# Patient Record
Sex: Male | Born: 1960
Health system: Southern US, Community
[De-identification: ages and names within clinical notes are randomized; demographics above are authoritative.]

## PROBLEM LIST (undated history)

## (undated) DIAGNOSIS — E119 Type 2 diabetes mellitus without complications: Secondary | ICD-10-CM

## (undated) DIAGNOSIS — M109 Gout, unspecified: Secondary | ICD-10-CM

## (undated) DIAGNOSIS — K219 Gastro-esophageal reflux disease without esophagitis: Secondary | ICD-10-CM

## (undated) HISTORY — DX: Type 2 diabetes mellitus without complications: E11.9

## (undated) HISTORY — PX: THYROID SURGERY: SHX805

---

## 2004-05-14 ENCOUNTER — Emergency Department (HOSPITAL_COMMUNITY): Admission: EM | Admit: 2004-05-14 | Discharge: 2004-05-14 | Payer: Self-pay | Admitting: Emergency Medicine

## 2008-02-03 ENCOUNTER — Emergency Department (HOSPITAL_COMMUNITY): Admission: EM | Admit: 2008-02-03 | Discharge: 2008-02-03 | Payer: Self-pay | Admitting: Emergency Medicine

## 2011-11-15 ENCOUNTER — Encounter (HOSPITAL_COMMUNITY): Payer: Self-pay | Admitting: Emergency Medicine

## 2011-11-15 ENCOUNTER — Emergency Department (HOSPITAL_COMMUNITY)
Admission: EM | Admit: 2011-11-15 | Discharge: 2011-11-15 | Disposition: A | Discharge status: Home / Self Care | Payer: Self-pay | Attending: Emergency Medicine | Admitting: Emergency Medicine

## 2011-11-15 DIAGNOSIS — R319 Hematuria, unspecified: Secondary | ICD-10-CM | POA: Insufficient documentation

## 2011-11-15 DIAGNOSIS — N390 Urinary tract infection, site not specified: Secondary | ICD-10-CM | POA: Insufficient documentation

## 2011-11-15 DIAGNOSIS — K219 Gastro-esophageal reflux disease without esophagitis: Secondary | ICD-10-CM | POA: Insufficient documentation

## 2011-11-15 DIAGNOSIS — R300 Dysuria: Secondary | ICD-10-CM | POA: Insufficient documentation

## 2011-11-15 HISTORY — DX: Gastro-esophageal reflux disease without esophagitis: K21.9

## 2011-11-15 LAB — URINALYSIS, ROUTINE W REFLEX MICROSCOPIC
Bilirubin Urine: NEGATIVE
Ketones, ur: 15 mg/dL — AB
Nitrite: NEGATIVE
Protein, ur: 100 mg/dL — AB
pH: 6.5 (ref 5.0–8.0)

## 2011-11-15 LAB — URINE MICROSCOPIC-ADD ON

## 2011-11-15 MED ORDER — CIPROFLOXACIN HCL 500 MG PO TABS: 500.0000 mg | ORAL_TABLET | Freq: Two times a day (BID) | ORAL | Status: AC

## 2011-11-15 NOTE — ED Provider Notes (Signed)
History     CSN: [id number]  Arrival date & time 11/15/11  0719   First MD Initiated Contact with Patient 11/15/11 0732      Chief Complaint  Patient presents with  . Hematuria    (Consider location/radiation/quality/duration/timing/severity/associated sxs/prior treatment) Patient is a 51 y.o. male presenting with hematuria. The history is provided by the patient.  Hematuria This is a new problem. The current episode started today. He describes the hematuria as gross hematuria. The hematuria occurs during the initial portion of his urinary stream. He reports no clotting in his urine stream. His pain is at a severity of 0/10. He is experiencing no pain. He describes his urine color as clear. Irritative symptoms do not include frequency, nocturia or urgency. Obstructive symptoms do not include dribbling, a slower stream, straining or a weak stream. Associated symptoms include dysuria. Pertinent negatives include no abdominal pain, chills, fever, flank pain, genital pain, inability to urinate, nausea or vomiting. He is sexually active.  Pt states he did note that he had some "burning with urination" for the last two weeks. Today noted blood. Denies any pain. No penile or genital lesions. No abdominal pain. No nausea, vomiting. No fever, chills. No history of the same. Denies penile discharge.  Past Medical History  Diagnosis Date  . Acid reflux     Past Surgical History  Procedure Date  . Thyroid surgery     No family history on file.  History  Substance Use Topics  . Smoking status: Never Smoker   . Smokeless tobacco: Not on file  . Alcohol Use: No      Review of Systems  Constitutional: Negative for fever and chills.  HENT: Negative.   Respiratory: Negative.   Cardiovascular: Negative.   Gastrointestinal: Negative for nausea, vomiting and abdominal pain.  Genitourinary: Positive for dysuria and hematuria. Negative for urgency, frequency, flank pain, discharge, penile  swelling, scrotal swelling, genital sores, penile pain, testicular pain and nocturia.  Musculoskeletal: Negative.   Skin: Negative.   Neurological: Negative.   Psychiatric/Behavioral: Negative.     Allergies  Review of patient's allergies indicates no known allergies.  Home Medications   Current Outpatient Rx  Name Route Sig Dispense Refill  . IBUPROFEN 200 MG PO TABS Oral Take 200 mg by mouth every 6 (six) hours as needed. For pain      BP 161/85  Pulse 58  Temp 97.8 F (36.6 C)  Resp 16  SpO2 99%  Physical Exam  Nursing note and vitals reviewed. Constitutional: He is oriented to person, place, and time. He appears well-developed and well-nourished. No distress.  HENT:  Head: Normocephalic and atraumatic.  Eyes: Conjunctivae are normal.  Neck: Neck supple.  Cardiovascular: Normal rate, regular rhythm and normal heart sounds.   Pulmonary/Chest: Effort normal and breath sounds normal. No respiratory distress.  Abdominal: Soft. Bowel sounds are normal. He exhibits no distension. There is no tenderness.  Genitourinary: Penis normal. No penile tenderness.  Musculoskeletal: Normal range of motion.  Neurological: He is alert and oriented to person, place, and time.  Skin: Skin is warm and dry.  Psychiatric: He has a normal mood and affect.    ED Course  Procedures (including critical care time)  Pt with dysuria, hematuria onset this AM. Will check UA. Pt deneis penile discharge, lesions, scrotal pain, back pain, abdominal pain.  Results for orders placed during the hospital encounter of 11/15/11  URINALYSIS, ROUTINE W REFLEX MICROSCOPIC      Component Value Range  Color, Urine RED (*) YELLOW    APPearance TURBID (*) CLEAR    Specific Gravity, Urine 1.022  1.005 - 1.030    pH 6.5  5.0 - 8.0    Glucose, UA NEGATIVE  NEGATIVE (mg/dL)   Hgb urine dipstick LARGE (*) NEGATIVE    Bilirubin Urine NEGATIVE  NEGATIVE    Ketones, ur 15 (*) NEGATIVE (mg/dL)   Protein, ur 161  (*) NEGATIVE (mg/dL)   Urobilinogen, UA 2.0 (*) 0.0 - 1.0 (mg/dL)   Nitrite NEGATIVE  NEGATIVE    Leukocytes, UA LARGE (*) NEGATIVE   URINE MICROSCOPIC-ADD ON      Component Value Range   WBC, UA TOO NUMEROUS TO COUNT  <3 (WBC/hpf)   RBC / HPF TOO NUMEROUS TO COUNT  <3 (RBC/hpf)   Bacteria, UA FEW (*) RARE     Urine showed TNTC WBCs and few bacteria. Will treat for UTI. GC/Chlamydia cultures sent. Urine cultures sent. Pt otherwise non toxic. No signs of kidney stone, pyelonephritis, prostatitis, orchitis.     No diagnosis found.    MDM          Lottie Mussel, PA 11/15/11 [phone]

## 2011-11-15 NOTE — ED Notes (Signed)
Patient presents with dysuria x several weeks with worsening symptoms including urgency and hematuria this AM.  Patient denies penile discharge and denies pain at this time.  Patient resting quietly, no distress noted,

## 2011-11-15 NOTE — ED Notes (Signed)
Has had dysuria x 2 weeks this am had blood in urine

## 2011-11-15 NOTE — Discharge Instructions (Signed)
Your urine shows infection. This could be why you are seeing blood in it. Start cipro as prescribed until all gone. Your cultures are sent and if any different infection is identified you will be contacted by phone. Follow up with your doctor or urgent care if not improving or for recheck in 3-5 days.   Urinary Tract Infection Infections of the urinary tract can start in several places. A bladder infection (cystitis), a kidney infection (pyelonephritis), and a prostate infection (prostatitis) are different types of urinary tract infections (UTIs). They usually get better if treated with medicines (antibiotics) that kill germs. Take all the medicine until it is gone. You or your child may feel better in a few days, but TAKE ALL MEDICINE or the infection may not respond and may become more difficult to treat. HOME CARE INSTRUCTIONS   Drink enough water and fluids to keep the urine clear or pale yellow. Cranberry juice is especially recommended, in addition to large amounts of water.   Avoid caffeine, tea, and carbonated beverages. They tend to irritate the bladder.   Alcohol may irritate the prostate.   Only take over-the-counter or prescription medicines for pain, discomfort, or fever as directed by your caregiver.  To prevent further infections:  Empty the bladder often. Avoid holding urine for long periods of time.   After a bowel movement, women should cleanse from front to back. Use each tissue only once.   Empty the bladder before and after sexual intercourse.  FINDING OUT THE RESULTS OF YOUR TEST Not all test results are available during your visit. If your or your child's test results are not back during the visit, make an appointment with your caregiver to find out the results. Do not assume everything is normal if you have not heard from your caregiver or the medical facility. It is important for you to follow up on all test results. SEEK MEDICAL CARE IF:   There is back pain.    Your baby is older than 3 months with a rectal temperature of 100.5 F (38.1 C) or higher for more than 1 day.   Your or your child's problems (symptoms) are no better in 3 days. Return sooner if you or your child is getting worse.  SEEK IMMEDIATE MEDICAL CARE IF:   There is severe back pain or lower abdominal pain.   You or your child develops chills.   You have a fever.   Your baby is older than 3 months with a rectal temperature of 102 F (38.9 C) or higher.   Your baby is 55 months old or younger with a rectal temperature of 100.4 F (38 C) or higher.   There is nausea or vomiting.   There is continued burning or discomfort with urination.  MAKE SURE YOU:   Understand these instructions.   Will watch your condition.   Will get help right away if you are not doing well or get worse.  Document Released: 05/29/2005 Document Revised: 08/08/2011 Document Reviewed: 01/01/2007 [facility] Patient Information 2012 [place], [place].

## 2011-11-16 LAB — GC/CHLAMYDIA PROBE AMP, URINE: Chlamydia, Swab/Urine, PCR: NEGATIVE

## 2011-11-17 LAB — URINE CULTURE

## 2011-11-18 NOTE — ED Notes (Signed)
+   Urine Patient treated with cipro-sensitive to same-chart appended per protocol MD. 

## 2011-11-28 NOTE — ED Provider Notes (Signed)
Evaluation and management procedures were performed by the PA/NP under my supervision/collaboration.   Mahlet Jergens D Tatayana Beshears, MD 11/28/11 1023 

## 2012-12-09 ENCOUNTER — Encounter (HOSPITAL_COMMUNITY): Payer: Self-pay

## 2012-12-09 ENCOUNTER — Emergency Department (HOSPITAL_COMMUNITY)
Admission: EM | Admit: 2012-12-09 | Discharge: 2012-12-09 | Disposition: A | Discharge status: Home / Self Care | Payer: Self-pay | Attending: Emergency Medicine | Admitting: Emergency Medicine

## 2012-12-09 DIAGNOSIS — M109 Gout, unspecified: Secondary | ICD-10-CM | POA: Insufficient documentation

## 2012-12-09 DIAGNOSIS — Z8719 Personal history of other diseases of the digestive system: Secondary | ICD-10-CM | POA: Insufficient documentation

## 2012-12-09 HISTORY — DX: Gout, unspecified: M10.9

## 2012-12-09 LAB — POCT I-STAT, CHEM 8
Creatinine, Ser: 1.4 mg/dL — ABNORMAL HIGH (ref 0.50–1.35)
HCT: 43 % (ref 39.0–52.0)
Hemoglobin: 14.6 g/dL (ref 13.0–17.0)
Potassium: 3.6 mEq/L (ref 3.5–5.1)
TCO2: 23 mmol/L (ref 0–100)

## 2012-12-09 MED ORDER — HYDROCODONE-ACETAMINOPHEN 5-325 MG PO TABS: 1.0000 | ORAL_TABLET | Freq: Four times a day (QID) | ORAL | Status: DC | PRN

## 2012-12-09 MED ORDER — IBUPROFEN 200 MG PO TABS
600.0000 mg | ORAL_TABLET | Freq: Once | ORAL | Status: AC
  Administered 2012-12-09: 600 mg via ORAL
  Filled 2012-12-09: qty 3

## 2012-12-09 MED ORDER — OXYCODONE-ACETAMINOPHEN 5-325 MG PO TABS
1.0000 | ORAL_TABLET | Freq: Once | ORAL | Status: DC
  Filled 2012-12-09: qty 1

## 2012-12-09 MED ORDER — INDOMETHACIN 50 MG PO CAPS: 50.0000 mg | ORAL_CAPSULE | Freq: Three times a day (TID) | ORAL | Status: DC

## 2012-12-09 MED ORDER — PREDNISONE 20 MG PO TABS
60.0000 mg | ORAL_TABLET | Freq: Once | ORAL | Status: AC
  Administered 2012-12-09: 60 mg via ORAL
  Filled 2012-12-09: qty 3

## 2012-12-09 NOTE — ED Provider Notes (Signed)
History    This chart was scribed for non-physician practitioner Earlie Raveling PA-C working with Nelia Shi, MD by Smitty Pluck, ED scribe. This patient was seen in room WTR7/WTR7 and the patient's care was started at 7:31PM.   CSN: [id number]  Arrival date & time 12/09/12  1641      No chief complaint on file.   (Consider location/radiation/quality/duration/timing/severity/associated sxs/prior treatment) The history is provided by the patient. No language interpreter was used.   Eric Larson is a 52 y.o. male with hx of gout who presents to the Emergency Department complaining of constant, moderate right elbow pain suddenly onset today when he awoke. He reports that he has slight tingling sensation when he went to sleep last night. Movement of right arm aggravates the pain. Pt denies injury to elbow, fever, chills, nausea, vomiting, diarrhea, weakness, cough, SOB and any other pain. He denies taking medication for gout.    Past Medical History  Diagnosis Date  . Acid reflux     Past Surgical History  Procedure Laterality Date  . Thyroid surgery      No family history on file.  History  Substance Use Topics  . Smoking status: Never Smoker   . Smokeless tobacco: Not on file  . Alcohol Use: No      Review of Systems  Constitutional: Negative for fever and chills.  Respiratory: Negative for shortness of breath.   Gastrointestinal: Negative for nausea and vomiting.  Musculoskeletal: Positive for arthralgias.  Neurological: Negative for weakness.  All other systems reviewed and are negative.    Allergies  Review of patient's allergies indicates no known allergies.  Home Medications  No current outpatient prescriptions on file.  BP 142/92  Pulse 63  Temp(Src) 98.2 F (36.8 C) (Oral)  Resp 18  SpO2 99%  Physical Exam  Nursing note and vitals reviewed. Constitutional: He appears well-developed and well-nourished. No distress.  HENT:  Head: Normocephalic and  atraumatic.  Eyes: Conjunctivae and EOM are normal.  Neck: Normal range of motion. Neck supple.  Cardiovascular:  Intact distal pulses, capillary refill < 3 seconds  Musculoskeletal:  Right elbow tender, warm and swollen. Pain with flexion extension.  All other extremities with normal ROM.   Neurological:  No sensory deficit  Skin: He is not diaphoretic.  Skin intact, no tenting    ED Course  Procedures (including critical care time) DIAGNOSTIC STUDIES: Oxygen Saturation is 99% on room air, normal by my interpretation.    COORDINATION OF CARE: 7:32 PM Discussed ED treatment with pt and pt agrees to labs to check creatinine.     Labs Reviewed - No data to display No results found.   No diagnosis found.    MDM  Pt presents with a hx of gout presents w monoarticular pain, swelling and erythema.  Pt is afebrile and stable. No recent trauma. Renal function good. Pt without known peptic ulcer disease and not receiving concurrent treatment on warfarin. Treated in ER w steroids and NSAIDs. Pt dc with indomethacin (50 mg PO TID). Discussed that pt should respond to treatment with in 24 hour of begining treatment & likely resolve in 2-3 days.        I personally performed the services described in this documentation, which was scribed in my presence. The recorded information has been reviewed and is accurate.      Jaci Carrel, [place] 12/09/12 2004

## 2012-12-09 NOTE — ED Notes (Signed)
Pt c/o right elbow pain and swelling onset Monday increase in severity. Right elbow with swelling noted. Pt denies injury.

## 2012-12-09 NOTE — ED Provider Notes (Signed)
Medical screening examination/treatment/procedure(s) were performed by non-physician practitioner and as supervising physician I was immediately available for consultation/collaboration.   Nelia Shi, MD 12/09/12 2023

## 2013-01-30 ENCOUNTER — Emergency Department (HOSPITAL_COMMUNITY): Payer: Self-pay

## 2013-01-30 ENCOUNTER — Encounter (HOSPITAL_COMMUNITY): Payer: Self-pay | Admitting: Emergency Medicine

## 2013-01-30 ENCOUNTER — Emergency Department (HOSPITAL_COMMUNITY)
Admission: EM | Admit: 2013-01-30 | Discharge: 2013-01-30 | Disposition: A | Discharge status: Home / Self Care | Payer: Self-pay | Attending: Emergency Medicine | Admitting: Emergency Medicine

## 2013-01-30 DIAGNOSIS — K029 Dental caries, unspecified: Secondary | ICD-10-CM

## 2013-01-30 DIAGNOSIS — Z8719 Personal history of other diseases of the digestive system: Secondary | ICD-10-CM | POA: Insufficient documentation

## 2013-01-30 DIAGNOSIS — K047 Periapical abscess without sinus: Secondary | ICD-10-CM

## 2013-01-30 DIAGNOSIS — K0520 Aggressive periodontitis, unspecified: Secondary | ICD-10-CM | POA: Insufficient documentation

## 2013-01-30 DIAGNOSIS — M109 Gout, unspecified: Secondary | ICD-10-CM | POA: Insufficient documentation

## 2013-01-30 LAB — POCT I-STAT, CHEM 8
BUN: 16 mg/dL (ref 6–23)
Calcium, Ion: 1.24 mmol/L — ABNORMAL HIGH (ref 1.12–1.23)
Chloride: 103 mEq/L (ref 96–112)
Creatinine, Ser: 1.4 mg/dL — ABNORMAL HIGH (ref 0.50–1.35)
Glucose, Bld: 174 mg/dL — ABNORMAL HIGH (ref 70–99)
HCT: 39 % (ref 39.0–52.0)
Hemoglobin: 13.3 g/dL (ref 13.0–17.0)
Potassium: 4.5 meq/L (ref 3.5–5.1)
Sodium: 138 meq/L (ref 135–145)
TCO2: 30 mmol/L (ref 0–100)

## 2013-01-30 LAB — CBC WITH DIFFERENTIAL/PLATELET
Basophils Absolute: 0 K/uL (ref 0.0–0.1)
Basophils Relative: 0 % (ref 0–1)
Eosinophils Absolute: 0.1 K/uL (ref 0.0–0.7)
Eosinophils Relative: 1 % (ref 0–5)
HCT: 36.9 % — ABNORMAL LOW (ref 39.0–52.0)
Hemoglobin: 13.3 g/dL (ref 13.0–17.0)
Lymphocytes Relative: 11 % — ABNORMAL LOW (ref 12–46)
Lymphs Abs: 1.2 10*3/uL (ref 0.7–4.0)
MCH: 31.5 pg (ref 26.0–34.0)
MCHC: 36 g/dL (ref 30.0–36.0)
MCV: 87.4 fL (ref 78.0–100.0)
Monocytes Absolute: 1.2 K/uL — ABNORMAL HIGH (ref 0.1–1.0)
Monocytes Relative: 11 % (ref 3–12)
Neutro Abs: 8.7 10*3/uL — ABNORMAL HIGH (ref 1.7–7.7)
Neutrophils Relative %: 78 % — ABNORMAL HIGH (ref 43–77)
Platelets: 182 10*3/uL (ref 150–400)
RBC: 4.22 MIL/uL (ref 4.22–5.81)
RDW: 13 % (ref 11.5–15.5)
WBC: 11.1 10*3/uL — ABNORMAL HIGH (ref 4.0–10.5)

## 2013-01-30 MED ORDER — HYDROCODONE-ACETAMINOPHEN 5-325 MG PO TABS: ORAL_TABLET | ORAL | Status: DC

## 2013-01-30 MED ORDER — MORPHINE SULFATE 4 MG/ML IJ SOLN
4.0000 mg | Freq: Once | INTRAMUSCULAR | Status: AC
  Administered 2013-01-30: 4 mg via INTRAVENOUS
  Filled 2013-01-30: qty 1

## 2013-01-30 MED ORDER — CLINDAMYCIN PHOSPHATE 900 MG/50ML IV SOLN
900.0000 mg | Freq: Once | INTRAVENOUS | Status: AC
  Administered 2013-01-30: 900 mg via INTRAVENOUS
  Filled 2013-01-30: qty 50

## 2013-01-30 MED ORDER — AMOXICILLIN 500 MG PO CAPS: 500.0000 mg | ORAL_CAPSULE | Freq: Three times a day (TID) | ORAL | Status: DC

## 2013-01-30 MED ORDER — SODIUM CHLORIDE 0.9 % IV SOLN
Freq: Once | INTRAVENOUS | Status: AC
  Administered 2013-01-30: 12:00:00 via INTRAVENOUS

## 2013-01-30 MED ORDER — IOHEXOL 300 MG/ML  SOLN
75.0000 mL | Freq: Once | INTRAMUSCULAR | Status: AC | PRN
  Administered 2013-01-30: 75 mL via INTRAVENOUS

## 2013-01-30 NOTE — ED Notes (Signed)
Pt in the bathroom at this time.  

## 2013-01-30 NOTE — Discharge Instructions (Signed)
 Abscessed Tooth A tooth abscess is a collection of infected fluid (pus) from a bacterial infection in the inner part of the tooth (pulp). It usually occurs at the end of the tooth's root.  CAUSES   A very bad cavity (extensive tooth decay).   Trauma to the tooth, such as a broken or chipped tooth, that allows bacteria to enter into the pulp.  SYMPTOMS  Severe pain in and around the infected tooth.   Swelling and redness around the abscessed tooth or in the mouth or face.   Tenderness.   Pus drainage.   Bad breath.   Bitter taste in the mouth.   Difficulty swallowing.   Difficulty opening the mouth.   Feeling sick to your stomach (nauseous).   Vomiting.   Chills.   Swollen neck glands.  DIAGNOSIS  A medical and dental history will be taken.   An examination will be performed by tapping on the abscessed tooth.   X-rays may be taken of the tooth to identify the abscess.  TREATMENT The goal of treatment is to eliminate the infection.   You may be prescribed antibiotic medicine to stop the infection from spreading.   A root canal may be performed to save the tooth. If the tooth cannot be saved, it may be pulled (extracted) and the abscess may be drained.  HOME CARE INSTRUCTIONS  Only take over-the-counter or prescription medicines for pain, fever, or discomfort as directed by your caregiver.   Do not drive after taking pain medicine (narcotics).   Rinse your mouth (gargle) often with salt water  ( tsp salt in 8 oz of warm water ) to relieve pain or swelling.   Do not apply heat to the outside of your face.   Return to your dentist for further treatment as directed.  SEEK IMMEDIATE DENTAL CARE IF:  You have a temperature by mouth above 102 F (38.9 C), not controlled by medicine.   You have chills or a very bad headache.   You have problems breathing or swallowing.   Your have trouble opening your mouth.   You develop swelling in the neck or around the eye.    Your pain is not helped by medicine.   Your pain is getting worse instead of better.  Document Released: 08/19/2005 Document Revised: 08/08/2011 Document Reviewed: 11/27/2010 [facility] Patient Information 2012 [place], [place].    Dental Care and Dentist Visits Dental care supports good overall health. Regular dental visits can also help you avoid dental pain, bleeding, infection, and other more serious health problems in the future. It is important to keep the mouth healthy because diseases in the teeth, gums, and other oral tissues can spread to other areas of the body. Some problems, such as diabetes, heart disease, and pre-term labor have been associated with poor oral health.  See your dentist every 6 months. If you experience emergency problems such as a toothache or broken tooth, go to the dentist right away. If you see your dentist regularly, you may catch problems early. It is easier to be treated for problems in the early stages.  WHAT TO EXPECT AT A DENTIST VISIT  Your dentist will look for many common oral health problems and recommend proper treatment. At your regular dental visit, you can expect:  Gentle cleaning of the teeth and gums. This includes scraping and polishing. This helps to remove the sticky substance around the teeth and gums (plaque). Plaque forms in the mouth shortly after eating. Over time, plaque hardens on  the teeth as tartar. If tartar is not removed regularly, it can cause problems. Cleaning also helps remove stains.  Periodic X-rays. These pictures of the teeth and supporting bone will help your dentist assess the health of your teeth.  Periodic fluoride treatments. Fluoride is a natural mineral shown to help strengthen teeth. Fluoride treatmentinvolves applying a fluoride gel or varnish to the teeth. It is most commonly done in children.  Examination of the mouth, tongue, jaws, teeth, and gums to look for any oral health problems, such as:  Cavities  (dental caries). This is decay on the tooth caused by plaque, sugar, and acid in the mouth. It is best to catch a cavity when it is small.  Inflammation of the gums caused by plaque buildup (gingivitis).  Problems with the mouth or malformed or misaligned teeth.  Oral cancer or other diseases of the soft tissues or jaws. KEEP YOUR TEETH AND GUMS HEALTHY For healthy teeth and gums, follow these general guidelines as well as your dentist's specific advice:  Have your teeth professionally cleaned at the dentist every 6 months.  Brush twice daily with a fluoride toothpaste.  Floss your teeth daily.  Ask your dentist if you need fluoride supplements, treatments, or fluoride toothpaste.  Eat a healthy diet. Reduce foods and drinks with added sugar.  Avoid smoking. TREATMENT FOR ORAL HEALTH PROBLEMS If you have oral health problems, treatment varies depending on the conditions present in your teeth and gums.  Your caregiver will most likely recommend good oral hygiene at each visit.  For cavities, gingivitis, or other oral health disease, your caregiver will perform a procedure to treat the problem. This is typically done at a separate appointment. Sometimes your caregiver will refer you to another dental specialist for specific tooth problems or for surgery. SEEK IMMEDIATE DENTAL CARE IF:  You have pain, bleeding, or soreness in the gum, tooth, jaw, or mouth area.  A permanent tooth becomes loose or separated from the gum socket.  You experience a blow or injury to the mouth or jaw area. Document Released: 05/01/2011 Document Revised: 11/11/2011 Document Reviewed: 05/01/2011 [facility] Patient Information 2014 [place], [place].    Narcotic and benzodiazepine use may cause drowsiness, slowed breathing or dependence.  Please use with caution and do not drive, operate machinery or watch young children alone while taking them.  Taking combinations of these medications or drinking alcohol  will potentiate these effects.

## 2013-01-30 NOTE — ED Notes (Signed)
Pt c/o left sided dental abscess with swelling to left side of neck; pt sts some pain

## 2013-01-30 NOTE — ED Notes (Signed)
NAD noted at time d/c inst given

## 2013-01-30 NOTE — ED Provider Notes (Signed)
History     CSN: [id number]  Arrival date & time 01/30/13  1119   First MD Initiated Contact with Patient 01/30/13 1133      Chief Complaint  Patient presents with  . Abscess    (Consider location/radiation/quality/duration/timing/severity/associated sxs/prior treatment) HPI Comments: Pt reports worsening pain and swelling to left jaw, left lower chin and neck for about 2-3 days, worse today, hurts to swallow.  No SOB, no voice change.  Pt has had dental problems and has some dental pain in left lower jaw and thinks that may be the problem.  Chills last night, unknown fever.  Taking Aleve with no sig relief.  Pt was planning on calling a dentist on Monday, but getting quickly worse.  Pt had thyroid removed about 15 years ago due to size of it. Not sure if thyroid hormones were too high or too low.  Was on thyroid meds for a while, but has not had symptoms of low thyroid so didn't have to take meds any more.  Takes no meds now except prn.  Doesn't smoke, drink or do drugs.  Has no PCP.    Patient is a 52 y.o. male presenting with abscess. The history is provided by the patient.  Abscess Location:  Mouth and head/neck Head/neck abscess location:  L neck Red streaking: no   Duration:  3 days Progression:  Worsening Context: not diabetes, not immunosuppression and not skin injury   Relieved by:  Nothing Ineffective treatments:  NSAIDs Associated symptoms: fever   Associated symptoms: no nausea and no vomiting     Past Medical History  Diagnosis Date  . Acid reflux   . Gout     Past Surgical History  Procedure Laterality Date  . Thyroid surgery      History reviewed. No pertinent family history.  History  Substance Use Topics  . Smoking status: Never Smoker   . Smokeless tobacco: Not on file  . Alcohol Use: No      Review of Systems  Constitutional: Positive for fever and chills. Negative for unexpected weight change.  HENT: Positive for sore throat, trouble  swallowing, neck pain and dental problem. Negative for congestion, rhinorrhea, drooling, neck stiffness and voice change.   Respiratory: Negative for cough and shortness of breath.   Cardiovascular: Negative for chest pain.  Gastrointestinal: Negative for nausea, vomiting and abdominal pain.  Neurological: Negative for weakness.  All other systems reviewed and are negative.    Allergies  Review of patient's allergies indicates no known allergies.  Home Medications   Current Outpatient Rx  Name  Route  Sig  Dispense  Refill  . naproxen sodium (ALEVE) 220 MG tablet   Oral   Take 660 mg by mouth once.         Marland Kitchen amoxicillin (AMOXIL) 500 MG capsule   Oral   Take 1 capsule (500 mg total) by mouth 3 (three) times daily.   30 capsule   0   . HYDROcodone-acetaminophen (NORCO/VICODIN) 5-325 MG per tablet      1-2 tablets po q 6 hours prn moderate to severe pain   20 tablet   0     BP 142/85  Pulse 61  Temp(Src) 98.3 F (36.8 C) (Oral)  Resp 20  Ht 6' (1.829 m)  Wt 230 lb (104.327 kg)  BMI 31.19 kg/m2  SpO2 96%  Physical Exam  Nursing note and vitals reviewed. Constitutional: He is oriented to person, place, and time. He appears well-developed and well-nourished.  HENT:  Head: Normocephalic. No trismus in the jaw.    Mouth/Throat: Mucous membranes are not dry and not cyanotic. No oral lesions. Dental caries present. No dental abscesses or edematous. No oropharyngeal exudate.    Eyes: EOM are normal. No scleral icterus.  Neck: Neck supple. No tracheal deviation present.  Cardiovascular: Normal rate, regular rhythm and intact distal pulses.   No murmur heard. Pulmonary/Chest: Effort normal. No stridor. No respiratory distress.  Abdominal: Soft. There is no tenderness.  Lymphadenopathy:    He has no cervical adenopathy.  Neurological: He is alert and oriented to person, place, and time. Coordination normal.  Skin: Skin is warm and dry. No rash noted.  Psychiatric:  He has a normal mood and affect.    ED Course  Procedures (including critical care time)  Labs Reviewed  CBC WITH DIFFERENTIAL - Abnormal; Notable for the following:    WBC 11.1 (*)    HCT 36.9 (*)    Neutrophils Relative % 78 (*)    Neutro Abs 8.7 (*)    Lymphocytes Relative 11 (*)    Monocytes Absolute 1.2 (*)    All other components within normal limits  POCT I-STAT, CHEM 8 - Abnormal; Notable for the following:    Creatinine, Ser 1.40 (*)    Glucose, Bld 174 (*)    Calcium, Ion 1.24 (*)    All other components within normal limits   Ct Soft Tissue Neck W Contrast  01/30/2013   *RADIOLOGY REPORT*  Clinical Data: Pain and swelling under the left mandible.  CT NECK WITH CONTRAST  Technique:  Multidetector CT imaging of the neck was performed with intravenous contrast.  Contrast: 75mL OMNIPAQUE IOHEXOL 300 MG/ML  SOLN  Comparison: None.  Findings: Multiple dental caries are evident throughout the remaining teeth.  There are multiple periapical lucencies along the mandibular roots bilaterally and anteriorly in the maxilla.  There focal lucencies are noted about the roots of the remaining left mandibular molar, likely the second mandibular molar.  There is a large peripherally enhancing fluid collection which extends to the medial aspect of the left mandible where the more posterior periapical lucency extends through the cortex.  The collection measures 2.2 x 5.0 x 1.7 cm extending into the sublingual space and displacing the intrinsic tongue muscles to the right.  The left submandibular gland is displaced posteriorly and inferiorly.  There is diffuse stranding within the neck, greater on the left.  There is thickening of the left platysma muscle. Multiple submental and submandibular lymph nodes appear reactive. The parotid glands are within normal limits bilaterally. Inflammatory changes are noted within the left parapharyngeal space.  The airway is slightly displaced to the right.  Calcifications are noted within the palatine tonsils without a discrete mass.  No other focal mucosal or submucosal lesions are present.  The vocal cords are midline and symmetric.  Ectopic thyroid tissue is present at the level of the thyrohyoid ligaments in the midline anteriorly.  The patient is status post thyroidectomy in the more conventional location.  No other significant adenopathy is present.  The bone windows demonstrate mild degenerative change, most evident on the right at C3-4 and on the left at C4-5 and C5-6.  IMPRESSION:  1.  2.2 x 5.0 x 1.7 cm peripherally enhancing irregular collection within the left sublingual space appears be related to dental caries and a periapical cyst associated with the posterior left mandibular molar. This is most consistent with a sublingual abscess. 2.  Reactive type  level I level II lymph nodes bilaterally. 3.  Inflammatory changes within the submandibular space and neck, more prominent on the left. No other abscess is evident. 4.  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 5.  Mild spondylosis of the cervical spine.   Original Report Authenticated By: Marin Roberts, M.D.     1. Dental abscess   2. Dental caries      ra sat is 96% and I interpret to be normal  1:53 PM I reviewed the CT scan myself, revealed interpretation by the radiologist. I spoke to Dr. Mayford Knife who is the on-call dentist. He agrees with giving the patient a dose of IV antibiotics, recommends a prescription for amoxicillin 3 times a day, and instructs me to inform the patient to call the office first thing on Monday morning and they'll take care of him from that standpoint. This is relayed to the patient who agrees with the plan. He understands to call the dentist office first thing Monday morning. My plan is to give him a prescription for Vicodin as well as amoxicillin. He is instructed to continue  taking some ibuprofen for the swelling as well. He also understands to return if pain or swelling suddenly worsens and he has any difficulty breathing.  His pain did improve with IV morphine here.   MDM   Pt with firm mass to left lower chin, jaw and extends into ventral neck region, more to left side.  No fever.  Will obtain soft tissue CT of neck with contrast to assess for mass versus abscess.           Gavin Pound. Shacora Zynda, MD 01/30/13 1356

## 2013-02-01 ENCOUNTER — Emergency Department (HOSPITAL_COMMUNITY)
Admission: EM | Admit: 2013-02-01 | Discharge: 2013-02-01 | Discharge status: Against Medical Advice | Payer: Self-pay | Attending: Emergency Medicine | Admitting: Emergency Medicine

## 2013-02-01 ENCOUNTER — Encounter (HOSPITAL_COMMUNITY): Payer: Self-pay | Admitting: Emergency Medicine

## 2013-02-01 DIAGNOSIS — K047 Periapical abscess without sinus: Secondary | ICD-10-CM | POA: Insufficient documentation

## 2013-02-01 DIAGNOSIS — K0889 Other specified disorders of teeth and supporting structures: Secondary | ICD-10-CM

## 2013-02-01 NOTE — ED Notes (Signed)
Called x1 w/no answer  

## 2013-02-01 NOTE — ED Notes (Signed)
Pt here for dental abscess to left side of face; pt seen here recently for same and not improved

## 2013-02-01 NOTE — ED Notes (Signed)
Called x2

## 2013-02-01 NOTE — ED Notes (Signed)
Called x3 w/ no answer

## 2013-02-03 NOTE — ED Provider Notes (Signed)
Patient eloped from the emergency department before physician evaluation.  I did not see this patient.  Triage note states the patient presents with left-sided dental abscess and pain not improving.  Lyanne Co, MD 02/03/13 [phone]

## 2013-02-25 ENCOUNTER — Encounter (HOSPITAL_COMMUNITY): Payer: Self-pay | Admitting: *Deleted

## 2013-02-25 ENCOUNTER — Emergency Department (HOSPITAL_COMMUNITY)
Admission: EM | Admit: 2013-02-25 | Discharge: 2013-02-25 | Disposition: A | Discharge status: Home / Self Care | Payer: Self-pay | Attending: Emergency Medicine | Admitting: Emergency Medicine

## 2013-02-25 DIAGNOSIS — K047 Periapical abscess without sinus: Secondary | ICD-10-CM | POA: Insufficient documentation

## 2013-02-25 DIAGNOSIS — M109 Gout, unspecified: Secondary | ICD-10-CM | POA: Insufficient documentation

## 2013-02-25 DIAGNOSIS — L539 Erythematous condition, unspecified: Secondary | ICD-10-CM | POA: Insufficient documentation

## 2013-02-25 DIAGNOSIS — Z79899 Other long term (current) drug therapy: Secondary | ICD-10-CM | POA: Insufficient documentation

## 2013-02-25 DIAGNOSIS — K029 Dental caries, unspecified: Secondary | ICD-10-CM | POA: Insufficient documentation

## 2013-02-25 DIAGNOSIS — Z8719 Personal history of other diseases of the digestive system: Secondary | ICD-10-CM | POA: Insufficient documentation

## 2013-02-25 MED ORDER — HYDROCODONE-ACETAMINOPHEN 5-325 MG PO TABS: 1.0000 | ORAL_TABLET | ORAL | Status: DC | PRN

## 2013-02-25 MED ORDER — AMOXICILLIN-POT CLAVULANATE 875-125 MG PO TABS: 1.0000 | ORAL_TABLET | Freq: Two times a day (BID) | ORAL | Status: DC

## 2013-02-25 MED ORDER — HYDROCODONE-ACETAMINOPHEN 5-325 MG PO TABS
2.0000 | ORAL_TABLET | Freq: Once | ORAL | Status: AC
  Administered 2013-02-25: 2 via ORAL
  Filled 2013-02-25: qty 2

## 2013-02-25 NOTE — ED Provider Notes (Signed)
History    This chart was scribed for non-physician practitioner Dierdre Forth, PA working with Gilda Crease, * by Quintella Reichert, ED Scribe. This patient was seen in room TR07C/TR07C and the patient's care was started at 6:08 PM .   CSN: [id number]  Arrival date & time 02/25/13  1452     Chief Complaint  Patient presents with  . Abscess    The history is provided by the patient. No language interpreter was used.    HPI Comments: Eric Larson is a 52 y.o. male who presents to the Emergency Department complaining of a painful abscess to the left side of his neck that began one month ago.  Pt reports that he came to the ED for the same abscess one month ago and was prescribed a course of penicillin.  He finished his antibiotics 2 weeks ago and notes that the abscess is still present, although it is much smaller than at his last ED visit.  It was not drained at his last visit.  He has attempted to treat pain by taking six 220 mg Aleve, with significant relief.  He denies fever, chills, or any other associated symptoms.     Past Medical History  Diagnosis Date  . Acid reflux   . Gout    Past Surgical History  Procedure Laterality Date  . Thyroid surgery     History reviewed. No pertinent family history. History  Substance Use Topics  . Smoking status: Never Smoker   . Smokeless tobacco: Not on file  . Alcohol Use: No    Review of Systems  Constitutional: Negative for diaphoresis, appetite change and unexpected weight change.  HENT: Positive for dental problem. Negative for mouth sores and neck stiffness.   Eyes: Negative for visual disturbance.  Respiratory: Negative for cough, chest tightness, shortness of breath and wheezing.   Cardiovascular: Negative for chest pain.  Gastrointestinal: Negative for abdominal pain, diarrhea and constipation.  Endocrine: Negative for polydipsia, polyphagia and polyuria.  Genitourinary: Negative for dysuria, urgency,  frequency and hematuria.  Musculoskeletal: Negative for back pain.  Skin: Negative for rash.       Abscess  Allergic/Immunologic: Negative for immunocompromised state.  Neurological: Negative for syncope and light-headedness.  Hematological: Does not bruise/bleed easily.  Psychiatric/Behavioral: Negative for sleep disturbance. The patient is not nervous/anxious.     Allergies  Review of patient's allergies indicates no known allergies.  Home Medications   Current Outpatient Rx  Name  Route  Sig  Dispense  Refill  . naproxen sodium (ALEVE) 220 MG tablet   Oral   Take 1,320 mg by mouth at bedtime.          Marland Kitchen amoxicillin-clavulanate (AUGMENTIN) 875-125 MG per tablet   Oral   Take 1 tablet by mouth every 12 (twelve) hours.   14 tablet   0   . HYDROcodone-acetaminophen (NORCO/VICODIN) 5-325 MG per tablet   Oral   Take 1 tablet by mouth every 4 (four) hours as needed for pain.   11 tablet   0    BP 137/91  Pulse 70  Temp(Src) 97.7 F (36.5 C) (Oral)  Resp 18  SpO2 97%  Physical Exam  Nursing note and vitals reviewed. Constitutional: He is oriented to person, place, and time. He appears well-developed and well-nourished. No distress.  HENT:  Head: Normocephalic and atraumatic. No trismus in the jaw.  Right Ear: Tympanic membrane, external ear and ear canal normal.  Left Ear: Tympanic membrane, external ear and ear  canal normal.  Nose: Nose normal. No mucosal edema or rhinorrhea.  Mouth/Throat: Uvula is midline, oropharynx is clear and moist and mucous membranes are normal. Mucous membranes are not dry. He does not have dentures. No oral lesions. Abnormal dentition. Dental caries present. No edematous or lacerations. No oropharyngeal exudate, posterior oropharyngeal edema, posterior oropharyngeal erythema or tonsillar abscesses.  Significantly poor dentition.  Dental caries and broken teeth throughout.  No gumline abscess.  No induration of floor of mouth.  Eyes:  Conjunctivae are normal. No scleral icterus.  Neck: Normal range of motion, full passive range of motion without pain and phonation normal. No spinous process tenderness and no muscular tenderness present. No rigidity. Normal range of motion present.    Cardiovascular: Normal rate, regular rhythm, normal heart sounds and intact distal pulses.   No murmur heard. Pulmonary/Chest: Effort normal and breath sounds normal. No stridor.  Abdominal: Soft. Bowel sounds are normal. He exhibits no distension. There is no tenderness.  Musculoskeletal: Normal range of motion. He exhibits no edema and no tenderness.  Lymphadenopathy:    He has no cervical adenopathy.  Neurological: He is alert and oriented to person, place, and time. He exhibits normal muscle tone. Coordination normal.  Skin: Skin is warm and dry. No rash noted. He is not diaphoretic. There is erythema.   5x6-cm area of mild erythema, swelling and induration in the soft tissue space of the inferior jaw.  No area of fluctuance.  Psychiatric: He has a normal mood and affect.    ED Course  Procedures (including critical care time)  DIAGNOSTIC STUDIES: Oxygen Saturation is 97% on room air, normal by my interpretation.    COORDINATION OF CARE: 6:11 PM-Discussed treatment plan which includes pain medication, consult with attending physician and likely treatment with a new antibiotics course with pt at bedside and pt agreed to plan.      Labs Reviewed - No data to display  No results found.  1. Abscess, dental      MDM  Eric Larson presents with abscess of the inferior jaw line/soft tissue.  He was seen on 01/30/13 for the same.  Record review shows that he received a CT scan of his neck at that time revealing a dental abscess.  I personally reviewed the imaging tests through PACS system.  I reviewed available ER/hospitalization records through the EMR. Pt was then d/c home with PCN and dental f/u.  Pt returns today with some  improvement but no resolution of the abscess.  No indication for further CT as there is no induration of the floor of the mouth, abscess is discrete, pt is afebrile and not tachycardic.  Pt to be d/c with Augmentin, pain medications as well as f/u for dentistry and ENT.  Pt alert, nontoxic, nonseptic appearing.  No evidence of Ludwig's angina.  I believe this is a persistent dental infection and not a new abscess.  No area of fluctuance for which and I&D would be warranted.  I have also discussed reasons to return immediately to the ER.  Patient expresses understanding and agrees with plan.  Dr. Blinda Leatherwood was consulted, evaluated this patient with me and agrees with the plan.    I personally performed the services described in this documentation, which was scribed in my presence. The recorded information has been reviewed and is accurate.      Dahlia Client Kodi Steil, PA-C 02/25/13 1850

## 2013-02-25 NOTE — ED Notes (Signed)
Pt reports an abscess on the (L) side of his neck.  Pt was treated a month ago and took antibiotics without relief.

## 2013-03-01 NOTE — ED Provider Notes (Signed)
Medical screening examination/treatment/procedure(s) were performed by non-physician practitioner and as supervising physician I was immediately available for consultation/collaboration.    Christopher J. Pollina, MD 03/01/13 1533 

## 2013-07-26 ENCOUNTER — Encounter (HOSPITAL_COMMUNITY): Payer: Self-pay | Admitting: Emergency Medicine

## 2013-07-26 ENCOUNTER — Emergency Department (HOSPITAL_COMMUNITY)
Admission: EM | Admit: 2013-07-26 | Discharge: 2013-07-27 | Disposition: A | Discharge status: Home / Self Care | Payer: Self-pay | Attending: Emergency Medicine | Admitting: Emergency Medicine

## 2013-07-26 DIAGNOSIS — Z8719 Personal history of other diseases of the digestive system: Secondary | ICD-10-CM | POA: Insufficient documentation

## 2013-07-26 DIAGNOSIS — Z862 Personal history of diseases of the blood and blood-forming organs and certain disorders involving the immune mechanism: Secondary | ICD-10-CM | POA: Insufficient documentation

## 2013-07-26 DIAGNOSIS — Z8639 Personal history of other endocrine, nutritional and metabolic disease: Secondary | ICD-10-CM | POA: Insufficient documentation

## 2013-07-26 DIAGNOSIS — Z113 Encounter for screening for infections with a predominantly sexual mode of transmission: Secondary | ICD-10-CM | POA: Insufficient documentation

## 2013-07-26 LAB — URINALYSIS, ROUTINE W REFLEX MICROSCOPIC
Bilirubin Urine: NEGATIVE
Glucose, UA: 250 mg/dL — AB
Ketones, ur: NEGATIVE mg/dL
Leukocytes, UA: NEGATIVE
Specific Gravity, Urine: 1.025 (ref 1.005–1.030)
pH: 5.5 (ref 5.0–8.0)

## 2013-07-26 LAB — RAPID HIV SCREEN (WH-MAU): Rapid HIV Screen: NONREACTIVE

## 2013-07-26 LAB — URINE MICROSCOPIC-ADD ON

## 2013-07-26 MED ORDER — STERILE WATER FOR INJECTION IJ SOLN
INTRAMUSCULAR | Status: AC
  Filled 2013-07-26: qty 10

## 2013-07-26 MED ORDER — CEFTRIAXONE SODIUM 250 MG IJ SOLR
250.0000 mg | Freq: Once | INTRAMUSCULAR | Status: AC
  Administered 2013-07-26: 250 mg via INTRAMUSCULAR
  Filled 2013-07-26: qty 250

## 2013-07-26 MED ORDER — AZITHROMYCIN 250 MG PO TABS
1000.0000 mg | ORAL_TABLET | Freq: Once | ORAL | Status: AC
  Administered 2013-07-26: 1000 mg via ORAL
  Filled 2013-07-26: qty 4

## 2013-07-26 MED ORDER — LIDOCAINE HCL 1 % IJ SOLN
INTRAMUSCULAR | Status: AC
  Administered 2013-07-26: 0.9 mL
  Filled 2013-07-26: qty 20

## 2013-07-26 NOTE — ED Notes (Signed)
GC/Chlamydia swab at bedside. 

## 2013-07-26 NOTE — ED Provider Notes (Signed)
CSN: [id number]     Arrival date & time 07/26/13  1439 History   First MD Initiated Contact with Patient 07/26/13 1526     Chief Complaint  Patient presents with  . Groin Pain   (Consider location/radiation/quality/duration/timing/severity/associated sxs/prior Treatment) HPI Comments: Patient presents to the ED with a chief complaint of STD screen.  Patient states that his GF told him that he needed to be checked for STDs.  Patient states that since she told him this, his penis has felt funny.  He denies any specific symptoms, including, discharge, tenderness, or dysuria.  He denies any fevers, chills, nausea, or vomiting.  Patient states that he would like to be checked for "everything."  Nothing makes his symptoms better or worse.  He has not tried anything to alleviate his symptoms.  The history is provided by the patient. No language interpreter was used.    Past Medical History  Diagnosis Date  . Acid reflux   . Gout    Past Surgical History  Procedure Laterality Date  . Thyroid surgery     History reviewed. No pertinent family history. History  Substance Use Topics  . Smoking status: Never Smoker   . Smokeless tobacco: Never Used  . Alcohol Use: No    Review of Systems  All other systems reviewed and are negative.    Allergies  Review of patient's allergies indicates no known allergies.  Home Medications  No current outpatient prescriptions on file. BP 163/98  Pulse 83  Temp(Src) 98.1 F (36.7 C) (Oral)  Resp 16  SpO2 97% Physical Exam  Nursing note and vitals reviewed. Constitutional: He is oriented to person, place, and time. He appears well-developed and well-nourished.  HENT:  Head: Normocephalic and atraumatic.  Eyes: Conjunctivae and EOM are normal. Pupils are equal, round, and reactive to light. Right eye exhibits no discharge. Left eye exhibits no discharge. No scleral icterus.  Neck: Normal range of motion. Neck supple. No JVD present.   Cardiovascular: Normal rate, regular rhythm and normal heart sounds.  Exam reveals no gallop and no friction rub.   No murmur heard. Pulmonary/Chest: Effort normal and breath sounds normal. No respiratory distress. He has no wheezes. He has no rales. He exhibits no tenderness.  Abdominal: Soft. He exhibits no distension.  Genitourinary: Penis normal.  Normal circumcised male, no penile discharge, no masses, lesions, or lumps on the shaft or testicles, no tenderness to palpation, no hernias  Musculoskeletal: Normal range of motion. He exhibits no edema and no tenderness.  Neurological: He is alert and oriented to person, place, and time. He has normal reflexes.  Skin: Skin is warm and dry.  Psychiatric: He has a normal mood and affect. His behavior is normal. Judgment and thought content normal.    ED Course  Procedures (including critical care time) ,. Results for orders placed during the hospital encounter of 07/26/13  URINALYSIS, ROUTINE W REFLEX MICROSCOPIC      Result Value Range   Color, Urine YELLOW  YELLOW   APPearance CLEAR  CLEAR   Specific Gravity, Urine 1.025  1.005 - 1.030   pH 5.5  5.0 - 8.0   Glucose, UA 250 (*) NEGATIVE mg/dL   Hgb urine dipstick SMALL (*) NEGATIVE   Bilirubin Urine NEGATIVE  NEGATIVE   Ketones, ur NEGATIVE  NEGATIVE mg/dL   Protein, ur NEGATIVE  NEGATIVE mg/dL   Urobilinogen, UA 1.0  0.0 - 1.0 mg/dL   Nitrite NEGATIVE  NEGATIVE   Leukocytes, UA NEGATIVE  NEGATIVE  RAPID HIV SCREEN ([facility])      Result Value Range   SUDS Rapid HIV Screen NON REACTIVE  NON REACTIVE  URINE MICROSCOPIC-ADD ON      Result Value Range   Squamous Epithelial / LPF RARE  RARE   WBC, UA 0-2  <3 WBC/hpf   Bacteria, UA RARE  RARE   Casts GRANULAR CAST (*) NEGATIVE      EKG Interpretation   None       MDM   1. Screen for STD (sexually transmitted disease)     Patient requesting screening and treatment for STDs.  Informed that GF has positive test.  No  unilateral tenderness.  No discharge.  No symptoms, other than patient stating that he feels "funny down there."  Patient not able to define what "funny" is, but states that he thinks he is just nervous because he was told to get checked out.   Labs are negative.  Treatment given.  Repeat UA in 1 week to ensure resolution of microscopic blood.   Roxy Horseman, PA-C 07/26/13 1800

## 2013-07-26 NOTE — ED Notes (Signed)
Lab called and reports a non-reactive rapid HIV screen

## 2013-07-26 NOTE — Progress Notes (Signed)
[facility] provided pt with a list of primary care resources and a [facility] [employer] application.  °

## 2013-07-26 NOTE — ED Notes (Signed)
Patient reports that he had a male friend that told him he needed to be checked for an STD, but did not confirm whether she was positive for an STD. Patient does not know if he has penile drainage or not. Patient reports that he has right groin pain x 2 days. Patient denies burning on urination.

## 2013-07-26 NOTE — ED Notes (Signed)
Pt aware of the need for a urine sample. 

## 2013-07-27 LAB — GC/CHLAMYDIA PROBE AMP: GC Probe RNA: NEGATIVE

## 2013-07-28 NOTE — ED Provider Notes (Signed)
Medical screening examination/treatment/procedure(s) were performed by non-physician practitioner and as supervising physician I was immediately available for consultation/collaboration.  EKG Interpretation   None        Toy Baker, MD 07/28/13 [phone]

## 2013-10-05 ENCOUNTER — Inpatient Hospital Stay (HOSPITAL_COMMUNITY)
Admission: EM | Admit: 2013-10-05 | Discharge: 2013-10-08 | DRG: 638 | Disposition: A | Discharge status: Home / Self Care | Payer: Self-pay | Attending: Internal Medicine | Admitting: Internal Medicine

## 2013-10-05 ENCOUNTER — Encounter (HOSPITAL_COMMUNITY): Payer: Self-pay | Admitting: Emergency Medicine

## 2013-10-05 DIAGNOSIS — E669 Obesity, unspecified: Secondary | ICD-10-CM | POA: Diagnosis present

## 2013-10-05 DIAGNOSIS — IMO0001 Reserved for inherently not codable concepts without codable children: Secondary | ICD-10-CM | POA: Diagnosis present

## 2013-10-05 DIAGNOSIS — E039 Hypothyroidism, unspecified: Secondary | ICD-10-CM | POA: Diagnosis present

## 2013-10-05 DIAGNOSIS — N289 Disorder of kidney and ureter, unspecified: Secondary | ICD-10-CM

## 2013-10-05 DIAGNOSIS — I129 Hypertensive chronic kidney disease with stage 1 through stage 4 chronic kidney disease, or unspecified chronic kidney disease: Secondary | ICD-10-CM | POA: Diagnosis present

## 2013-10-05 DIAGNOSIS — N179 Acute kidney failure, unspecified: Secondary | ICD-10-CM | POA: Diagnosis present

## 2013-10-05 DIAGNOSIS — K219 Gastro-esophageal reflux disease without esophagitis: Secondary | ICD-10-CM | POA: Diagnosis present

## 2013-10-05 DIAGNOSIS — E871 Hypo-osmolality and hyponatremia: Secondary | ICD-10-CM | POA: Diagnosis present

## 2013-10-05 DIAGNOSIS — D649 Anemia, unspecified: Secondary | ICD-10-CM | POA: Diagnosis present

## 2013-10-05 DIAGNOSIS — Z8739 Personal history of other diseases of the musculoskeletal system and connective tissue: Secondary | ICD-10-CM

## 2013-10-05 DIAGNOSIS — E1100 Type 2 diabetes mellitus with hyperosmolarity without nonketotic hyperglycemic-hyperosmolar coma (NKHHC): Principal | ICD-10-CM | POA: Diagnosis present

## 2013-10-05 DIAGNOSIS — E1110 Type 2 diabetes mellitus with ketoacidosis without coma: Secondary | ICD-10-CM

## 2013-10-05 DIAGNOSIS — M109 Gout, unspecified: Secondary | ICD-10-CM | POA: Diagnosis present

## 2013-10-05 DIAGNOSIS — N182 Chronic kidney disease, stage 2 (mild): Secondary | ICD-10-CM | POA: Diagnosis present

## 2013-10-05 DIAGNOSIS — R030 Elevated blood-pressure reading, without diagnosis of hypertension: Secondary | ICD-10-CM

## 2013-10-05 DIAGNOSIS — Z8639 Personal history of other endocrine, nutritional and metabolic disease: Secondary | ICD-10-CM | POA: Diagnosis present

## 2013-10-05 DIAGNOSIS — R739 Hyperglycemia, unspecified: Secondary | ICD-10-CM

## 2013-10-05 DIAGNOSIS — R634 Abnormal weight loss: Secondary | ICD-10-CM | POA: Diagnosis present

## 2013-10-05 LAB — URINALYSIS, ROUTINE W REFLEX MICROSCOPIC
Bilirubin Urine: NEGATIVE
Glucose, UA: >1000 mg/dL — AB
Hgb urine dipstick: NEGATIVE
Ketones, ur: NEGATIVE mg/dL
LEUKOCYTES UA: NEGATIVE
Nitrite: NEGATIVE
PROTEIN: NEGATIVE mg/dL
UROBILINOGEN UA: 0.2 mg/dL (ref 0.0–1.0)
pH: 6 (ref 5.0–8.0)

## 2013-10-05 LAB — URINE MICROSCOPIC-ADD ON

## 2013-10-05 LAB — GLUCOSE, CAPILLARY

## 2013-10-05 NOTE — ED Notes (Signed)
Pt. reports urinary frequency onset today , denies pain or dysuria , no fever or chills.

## 2013-10-05 NOTE — ED Provider Notes (Signed)
CSN: [id number]     Arrival date & time 10/05/13  2301 History  This chart was scribed for Fayrene Helper, PA working with Ethelda Chick, MD by Quintella Reichert, ED Scribe. This patient was seen in room TR09C/TR09C and the patient's care was started at 11:22 PM.   Chief Complaint  Patient presents with  . Urinary Frequency    The history is provided by the patient. No language interpreter was used.    HPI Comments:  Eric Larson is a 53 y.o. male with no known h/o DM who presents to the Emergency Department complaining of urinary frequency that began 2 weeks ago and is progressively-worsening, with associated visual changes, polydipsia, weight loss, and fatigue.  Pt states he has no prior issues with urinary frequency but over the past 2 week he has been urinating every hour, and more recently every half hour.  He also states "I stay thirsty" and he notes he also feels fatigued.  He states that he normally needs glasses to see close objects but for the past week he has also needed glasses to see further away objects.  He states that he has lost weight, going from 230 pounds to 215 pounds over the past 2 months.  He also notes that for the past week he has had an occasional sensation on waking described as "I can't move right or I can't walk right."  This lasts approximately 15 seconds and resolves on its own.  He denies dysuria, penile discharge, penile pain, scrotal swelling, CP, SOB, abdominal pain, nausea, vomiting, diarrhea, weakness or numbness.  Pt denies personal or family h/o diabetes.  He does not take any medications regularly.  He does not have a PCP.   Past Medical History  Diagnosis Date  . Acid reflux   . Gout     Past Surgical History  Procedure Laterality Date  . Thyroid surgery      No family history on file.   History  Substance Use Topics  . Smoking status: Never Smoker   . Smokeless tobacco: Never Used  . Alcohol Use: No     Review of Systems  Constitutional:  Positive for fatigue and unexpected weight change.  Eyes: Positive for visual disturbance.  Respiratory: Negative for shortness of breath.   Cardiovascular: Negative for chest pain.  Gastrointestinal: Negative for nausea, vomiting, abdominal pain and diarrhea.  Endocrine: Positive for polydipsia.  Genitourinary: Positive for frequency. Negative for dysuria, discharge, penile swelling, scrotal swelling and penile pain.  Neurological: Negative for weakness and numbness.     Allergies  Review of patient's allergies indicates no known allergies.  Home Medications  No current outpatient prescriptions on file.  BP 136/95  Pulse 73  Temp(Src) 97.7 F (36.5 C) (Oral)  Resp 16  SpO2 99%  Physical Exam  Nursing note and vitals reviewed. Constitutional: He is oriented to person, place, and time. He appears well-developed and well-nourished. No distress.  HENT:  Head: Normocephalic and atraumatic.  Eyes: EOM are normal.  Neck: Neck supple. No tracheal deviation present.  Cardiovascular: Normal rate.   Pulmonary/Chest: Effort normal and breath sounds normal. No respiratory distress. He has no wheezes. He has no rales.  Abdominal: Soft.  Musculoskeletal: Normal range of motion.  Neurological: He is alert and oriented to person, place, and time.  Skin: Skin is warm and dry.  Psychiatric: He has a normal mood and affect. His behavior is normal.    ED Course  Procedures (including critical care time)  DIAGNOSTIC STUDIES: Oxygen Saturation is 99% on room air, normal by my interpretation.    COORDINATION OF CARE: 11:26 PM-Discussed treatment plan which includes UA and bloodwork.  Pt expressed understanding and agreed with plan.    Labs Review Labs Reviewed  URINALYSIS, ROUTINE W REFLEX MICROSCOPIC - Abnormal; Notable for the following:    Specific Gravity, Urine <1.005 (*)    Glucose, UA >1000 (*)    All other components within normal limits  GLUCOSE, CAPILLARY - Abnormal;  Notable for the following:    Glucose-Capillary >600 (*)    All other components within normal limits  CBC WITH DIFFERENTIAL - Abnormal; Notable for the following:    MCHC 36.3 (*)    All other components within normal limits  COMPREHENSIVE METABOLIC PANEL - Abnormal; Notable for the following:    Sodium 123 (*)    Chloride 84 (*)    Glucose, Bld 748 (*)    Total Protein 8.5 (*)    ALT 63 (*)    Total Bilirubin 1.3 (*)    GFR calc non Af Amer 60 (*)    GFR calc Af Amer 69 (*)    All other components within normal limits  HEMOGLOBIN A1C - Abnormal; Notable for the following:    Hemoglobin A1C 11.9 (*)    Mean Plasma Glucose 295 (*)    All other components within normal limits  BASIC METABOLIC PANEL - Abnormal; Notable for the following:    Sodium 131 (*)    Chloride 93 (*)    Glucose, Bld 583 (*)    Creatinine, Ser 1.36 (*)    GFR calc non Af Amer 58 (*)    GFR calc Af Amer 67 (*)    All other components within normal limits  GLUCOSE, CAPILLARY - Abnormal; Notable for the following:    Glucose-Capillary 529 (*)    All other components within normal limits  TSH - Abnormal; Notable for the following:    TSH 18.966 (*)    All other components within normal limits  LIPID PANEL - Abnormal; Notable for the following:    Triglycerides 377 (*)    HDL 25 (*)    VLDL 75 (*)    All other components within normal limits  BASIC METABOLIC PANEL - Abnormal; Notable for the following:    Sodium 136 (*)    Glucose, Bld 393 (*)    Creatinine, Ser 1.43 (*)    GFR calc non Af Amer 55 (*)    GFR calc Af Amer 63 (*)    All other components within normal limits  BASIC METABOLIC PANEL - Abnormal; Notable for the following:    Glucose, Bld 241 (*)    Creatinine, Ser 1.40 (*)    GFR calc non Af Amer 56 (*)    GFR calc Af Amer 65 (*)    All other components within normal limits  BASIC METABOLIC PANEL - Abnormal; Notable for the following:    Potassium 3.6 (*)    Glucose, Bld 155 (*)     Creatinine, Ser 1.40 (*)    GFR calc non Af Amer 56 (*)    GFR calc Af Amer 65 (*)    All other components within normal limits  CBC - Abnormal; Notable for the following:    HCT 36.8 (*)    MCHC 36.1 (*)    Platelets 130 (*)    All other components within normal limits  GLUCOSE, CAPILLARY - Abnormal; Notable for the following:  Glucose-Capillary 489 (*)    All other components within normal limits  GLUCOSE, CAPILLARY - Abnormal; Notable for the following:    Glucose-Capillary 425 (*)    All other components within normal limits  GLUCOSE, CAPILLARY - Abnormal; Notable for the following:    Glucose-Capillary 341 (*)    All other components within normal limits  GLUCOSE, CAPILLARY - Abnormal; Notable for the following:    Glucose-Capillary 301 (*)    All other components within normal limits  GLUCOSE, CAPILLARY - Abnormal; Notable for the following:    Glucose-Capillary 280 (*)    All other components within normal limits  GLUCOSE, CAPILLARY - Abnormal; Notable for the following:    Glucose-Capillary 174 (*)    All other components within normal limits  GLUCOSE, CAPILLARY - Abnormal; Notable for the following:    Glucose-Capillary 150 (*)    All other components within normal limits  GLUCOSE, CAPILLARY - Abnormal; Notable for the following:    Glucose-Capillary 156 (*)    All other components within normal limits  GLUCOSE, CAPILLARY - Abnormal; Notable for the following:    Glucose-Capillary 158 (*)    All other components within normal limits  TSH - Abnormal; Notable for the following:    TSH 15.323 (*)    All other components within normal limits  BASIC METABOLIC PANEL - Abnormal; Notable for the following:    Sodium 136 (*)    Glucose, Bld 289 (*)    GFR calc non Af Amer 69 (*)    GFR calc Af Amer 80 (*)    All other components within normal limits  GLUCOSE, CAPILLARY - Abnormal; Notable for the following:    Glucose-Capillary 357 (*)    All other components within  normal limits  GLUCOSE, CAPILLARY - Abnormal; Notable for the following:    Glucose-Capillary 293 (*)    All other components within normal limits  GLUCOSE, CAPILLARY - Abnormal; Notable for the following:    Glucose-Capillary 294 (*)    All other components within normal limits  GLUCOSE, CAPILLARY - Abnormal; Notable for the following:    Glucose-Capillary 303 (*)    All other components within normal limits  GLUCOSE, CAPILLARY - Abnormal; Notable for the following:    Glucose-Capillary 325 (*)    All other components within normal limits  GLUCOSE, CAPILLARY - Abnormal; Notable for the following:    Glucose-Capillary 385 (*)    All other components within normal limits  BASIC METABOLIC PANEL - Abnormal; Notable for the following:    Glucose, Bld 309 (*)    GFR calc non Af Amer 64 (*)    GFR calc Af Amer 74 (*)    All other components within normal limits  GLUCOSE, CAPILLARY - Abnormal; Notable for the following:    Glucose-Capillary 287 (*)    All other components within normal limits  GLUCOSE, CAPILLARY - Abnormal; Notable for the following:    Glucose-Capillary 295 (*)    All other components within normal limits  POCT I-STAT, CHEM 8 - Abnormal; Notable for the following:    Sodium 127 (*)    Chloride 92 (*)    Creatinine, Ser 1.40 (*)    Glucose, Bld >700 (*)    All other components within normal limits  POCT I-STAT 3, BLOOD GAS (G3+) - Abnormal; Notable for the following:    pO2, Arterial 74.0 (*)    Bicarbonate 25.7 (*)    All other components within normal limits  URINE MICROSCOPIC-ADD  ON  KETONES, QUALITATIVE  TROPONIN I  T4, FREE  URINE RAPID DRUG SCREEN (HOSP PERFORMED)    Imaging Review No results found.   EKG Interpretation   None       MDM   1. Hyperglycemia without ketosis   2. Renal insufficiency   3. DKA (diabetic ketoacidoses)   4. Elevated blood pressure   5. History of goiter   6. History of gout   7. Type 2 diabetes mellitus with  hyperosmolar nonketotic hyperglycemia   8. AKI (acute kidney injury)    BP 126/62  Pulse 65  Temp(Src) 98 F (36.7 C) (Oral)  Resp 18  Ht 6' (1.829 m)  Wt 211 lb 14.4 oz (96.117 kg)  BMI 28.73 kg/m2  SpO2 96%   I personally performed the services described in this documentation, which was scribed in my presence. The recorded information has been reviewed and is accurate.      Fayrene HelperBowie Xolani Degracia, PA-C 10/13/13 1456

## 2013-10-06 ENCOUNTER — Encounter (HOSPITAL_COMMUNITY): Payer: Self-pay | Admitting: Internal Medicine

## 2013-10-06 DIAGNOSIS — Z8739 Personal history of other diseases of the musculoskeletal system and connective tissue: Secondary | ICD-10-CM | POA: Diagnosis present

## 2013-10-06 DIAGNOSIS — E1100 Type 2 diabetes mellitus with hyperosmolarity without nonketotic hyperglycemic-hyperosmolar coma (NKHHC): Secondary | ICD-10-CM | POA: Diagnosis present

## 2013-10-06 DIAGNOSIS — N183 Chronic kidney disease, stage 3 unspecified: Secondary | ICD-10-CM | POA: Insufficient documentation

## 2013-10-06 DIAGNOSIS — K219 Gastro-esophageal reflux disease without esophagitis: Secondary | ICD-10-CM | POA: Diagnosis present

## 2013-10-06 DIAGNOSIS — N189 Chronic kidney disease, unspecified: Secondary | ICD-10-CM | POA: Insufficient documentation

## 2013-10-06 DIAGNOSIS — R030 Elevated blood-pressure reading, without diagnosis of hypertension: Secondary | ICD-10-CM

## 2013-10-06 DIAGNOSIS — E1110 Type 2 diabetes mellitus with ketoacidosis without coma: Secondary | ICD-10-CM | POA: Insufficient documentation

## 2013-10-06 DIAGNOSIS — E669 Obesity, unspecified: Secondary | ICD-10-CM | POA: Diagnosis present

## 2013-10-06 DIAGNOSIS — E1101 Type 2 diabetes mellitus with hyperosmolarity with coma: Secondary | ICD-10-CM

## 2013-10-06 DIAGNOSIS — R739 Hyperglycemia, unspecified: Secondary | ICD-10-CM | POA: Insufficient documentation

## 2013-10-06 DIAGNOSIS — IMO0001 Reserved for inherently not codable concepts without codable children: Secondary | ICD-10-CM | POA: Diagnosis present

## 2013-10-06 DIAGNOSIS — Z862 Personal history of diseases of the blood and blood-forming organs and certain disorders involving the immune mechanism: Secondary | ICD-10-CM

## 2013-10-06 DIAGNOSIS — Z8639 Personal history of other endocrine, nutritional and metabolic disease: Secondary | ICD-10-CM | POA: Diagnosis present

## 2013-10-06 DIAGNOSIS — R7309 Other abnormal glucose: Secondary | ICD-10-CM

## 2013-10-06 LAB — BASIC METABOLIC PANEL
BUN: 18 mg/dL (ref 6–23)
BUN: 16 mg/dL (ref 6–23)
BUN: 15 mg/dL (ref 6–23)
BUN: 14 mg/dL (ref 6–23)
CALCIUM: 8.7 mg/dL (ref 8.4–10.5)
CHLORIDE: 98 meq/L (ref 96–112)
CHLORIDE: 103 meq/L (ref 96–112)
CO2: 25 meq/L (ref 19–32)
CO2: 26 meq/L (ref 19–32)
CO2: 29 meq/L (ref 19–32)
CO2: 28 meq/L (ref 19–32)
CREATININE: 1.4 mg/dL — AB (ref 0.50–1.35)
Calcium: 8.7 mg/dL (ref 8.4–10.5)
Calcium: 8.6 mg/dL (ref 8.4–10.5)
Calcium: 8.7 mg/dL (ref 8.4–10.5)
Chloride: 93 mEq/L — ABNORMAL LOW (ref 96–112)
Chloride: 102 mEq/L (ref 96–112)
Creatinine, Ser: 1.36 mg/dL — ABNORMAL HIGH (ref 0.50–1.35)
Creatinine, Ser: 1.43 mg/dL — ABNORMAL HIGH (ref 0.50–1.35)
Creatinine, Ser: 1.4 mg/dL — ABNORMAL HIGH (ref 0.50–1.35)
GFR calc Af Amer: 67 mL/min — ABNORMAL LOW (ref >90)
GFR calc Af Amer: 63 mL/min — ABNORMAL LOW (ref >90)
GFR calc Af Amer: 65 mL/min — ABNORMAL LOW (ref >90)
GFR calc non Af Amer: 55 mL/min — ABNORMAL LOW (ref >90)
GFR calc non Af Amer: 56 mL/min — ABNORMAL LOW (ref >90)
GFR calc non Af Amer: 56 mL/min — ABNORMAL LOW (ref >90)
GFR, EST AFRICAN AMERICAN: 65 mL/min — AB (ref >90)
GFR, EST NON AFRICAN AMERICAN: 58 mL/min — AB (ref >90)
GLUCOSE: 393 mg/dL — AB (ref 70–99)
GLUCOSE: 241 mg/dL — AB (ref 70–99)
Glucose, Bld: 583 mg/dL (ref 70–99)
Glucose, Bld: 155 mg/dL — ABNORMAL HIGH (ref 70–99)
POTASSIUM: 4.2 meq/L (ref 3.7–5.3)
POTASSIUM: 4.3 meq/L (ref 3.7–5.3)
Potassium: 4.1 mEq/L (ref 3.7–5.3)
Potassium: 3.6 mEq/L — ABNORMAL LOW (ref 3.7–5.3)
SODIUM: 131 meq/L — AB (ref 137–147)
Sodium: 136 mEq/L — ABNORMAL LOW (ref 137–147)
Sodium: 139 mEq/L (ref 137–147)
Sodium: 142 mEq/L (ref 137–147)

## 2013-10-06 LAB — LIPID PANEL
CHOL/HDL RATIO: 7.4 ratio
CHOLESTEROL: 186 mg/dL (ref 0–200)
HDL: 25 mg/dL — ABNORMAL LOW (ref >39)
LDL Cholesterol: 86 mg/dL (ref 0–99)
TRIGLYCERIDES: 377 mg/dL — AB (ref <150)
VLDL: 75 mg/dL — ABNORMAL HIGH (ref 0–40)

## 2013-10-06 LAB — GLUCOSE, CAPILLARY
GLUCOSE-CAPILLARY: 174 mg/dL — AB (ref 70–99)
GLUCOSE-CAPILLARY: 150 mg/dL — AB (ref 70–99)
GLUCOSE-CAPILLARY: 158 mg/dL — AB (ref 70–99)
GLUCOSE-CAPILLARY: 357 mg/dL — AB (ref 70–99)
Glucose-Capillary: 529 mg/dL — ABNORMAL HIGH (ref 70–99)
Glucose-Capillary: 489 mg/dL — ABNORMAL HIGH (ref 70–99)
Glucose-Capillary: 425 mg/dL — ABNORMAL HIGH (ref 70–99)
Glucose-Capillary: 341 mg/dL — ABNORMAL HIGH (ref 70–99)
Glucose-Capillary: 301 mg/dL — ABNORMAL HIGH (ref 70–99)
Glucose-Capillary: 280 mg/dL — ABNORMAL HIGH (ref 70–99)
Glucose-Capillary: 156 mg/dL — ABNORMAL HIGH (ref 70–99)

## 2013-10-06 LAB — COMPREHENSIVE METABOLIC PANEL WITH GFR
ALT: 63 U/L — ABNORMAL HIGH (ref 0–53)
AST: 27 U/L (ref 0–37)
Albumin: 3.5 g/dL (ref 3.5–5.2)
Alkaline Phosphatase: 75 U/L (ref 39–117)
BUN: 20 mg/dL (ref 6–23)
CO2: 23 meq/L (ref 19–32)
Calcium: 9.2 mg/dL (ref 8.4–10.5)
Chloride: 84 meq/L — ABNORMAL LOW (ref 96–112)
Creatinine, Ser: 1.33 mg/dL (ref 0.50–1.35)
GFR calc Af Amer: 69 mL/min — ABNORMAL LOW
GFR calc non Af Amer: 60 mL/min — ABNORMAL LOW
Glucose, Bld: 748 mg/dL (ref 70–99)
Potassium: 4.4 meq/L (ref 3.7–5.3)
Sodium: 123 meq/L — ABNORMAL LOW (ref 137–147)
Total Bilirubin: 1.3 mg/dL — ABNORMAL HIGH (ref 0.3–1.2)
Total Protein: 8.5 g/dL — ABNORMAL HIGH (ref 6.0–8.3)

## 2013-10-06 LAB — CBC
HEMATOCRIT: 36.8 % — AB (ref 39.0–52.0)
Hemoglobin: 13.3 g/dL (ref 13.0–17.0)
MCH: 31 pg (ref 26.0–34.0)
MCHC: 36.1 g/dL — ABNORMAL HIGH (ref 30.0–36.0)
MCV: 85.8 fL (ref 78.0–100.0)
Platelets: 130 10*3/uL — ABNORMAL LOW (ref 150–400)
RBC: 4.29 MIL/uL (ref 4.22–5.81)
RDW: 12.8 % (ref 11.5–15.5)
WBC: 4.4 10*3/uL (ref 4.0–10.5)

## 2013-10-06 LAB — CBC WITH DIFFERENTIAL/PLATELET
BASOS ABS: 0.1 10*3/uL (ref 0.0–0.1)
BASOS PCT: 1 % (ref 0–1)
Eosinophils Absolute: 0.1 10*3/uL (ref 0.0–0.7)
Eosinophils Relative: 2 % (ref 0–5)
HCT: 39.9 % (ref 39.0–52.0)
Hemoglobin: 14.5 g/dL (ref 13.0–17.0)
Lymphocytes Relative: 46 % (ref 12–46)
Lymphs Abs: 2.1 10*3/uL (ref 0.7–4.0)
MCH: 31.3 pg (ref 26.0–34.0)
MCHC: 36.3 g/dL — AB (ref 30.0–36.0)
MCV: 86.2 fL (ref 78.0–100.0)
Monocytes Absolute: 0.3 10*3/uL (ref 0.1–1.0)
Monocytes Relative: 7 % (ref 3–12)
NEUTROS ABS: 2 10*3/uL (ref 1.7–7.7)
NEUTROS PCT: 43 % (ref 43–77)
PLATELETS: 151 10*3/uL (ref 150–400)
RBC: 4.63 MIL/uL (ref 4.22–5.81)
RDW: 12.7 % (ref 11.5–15.5)
WBC: 4.6 10*3/uL (ref 4.0–10.5)

## 2013-10-06 LAB — POCT I-STAT, CHEM 8
BUN: 20 mg/dL (ref 6–23)
CREATININE: 1.4 mg/dL — AB (ref 0.50–1.35)
Calcium, Ion: 1.14 mmol/L (ref 1.12–1.23)
Chloride: 92 mEq/L — ABNORMAL LOW (ref 96–112)
Glucose, Bld: >700 mg/dL (ref 70–99)
HEMATOCRIT: 44 % (ref 39.0–52.0)
HEMOGLOBIN: 15 g/dL (ref 13.0–17.0)
Potassium: 4.5 mEq/L (ref 3.7–5.3)
SODIUM: 127 meq/L — AB (ref 137–147)
TCO2: 24 mmol/L (ref 0–100)

## 2013-10-06 LAB — POCT I-STAT 3, ART BLOOD GAS (G3+)
Acid-Base Excess: 1 mmol/L (ref 0.0–2.0)
BICARBONATE: 25.7 meq/L — AB (ref 20.0–24.0)
O2 Saturation: 94 %
PCO2 ART: 41.9 mmHg (ref 35.0–45.0)
PH ART: 7.396 (ref 7.350–7.450)
PO2 ART: 74 mmHg — AB (ref 80.0–100.0)
Patient temperature: 98.7
TCO2: 27 mmol/L (ref 0–100)

## 2013-10-06 LAB — HEMOGLOBIN A1C
Hgb A1c MFr Bld: 11.9 % — ABNORMAL HIGH (ref <5.7)
MEAN PLASMA GLUCOSE: 295 mg/dL — AB (ref <117)

## 2013-10-06 LAB — RAPID URINE DRUG SCREEN, HOSP PERFORMED
Amphetamines: NOT DETECTED
BARBITURATES: NOT DETECTED
Benzodiazepines: NOT DETECTED
Cocaine: NOT DETECTED
Opiates: NOT DETECTED
Tetrahydrocannabinol: NOT DETECTED

## 2013-10-06 LAB — TSH
TSH: 18.966 u[IU]/mL — AB (ref 0.350–4.500)
TSH: 15.323 u[IU]/mL — ABNORMAL HIGH (ref 0.350–4.500)

## 2013-10-06 LAB — KETONES, QUALITATIVE: Acetone, Bld: NEGATIVE

## 2013-10-06 LAB — TROPONIN I

## 2013-10-06 LAB — T4, FREE: FREE T4: 0.93 ng/dL (ref 0.80–1.80)

## 2013-10-06 MED ORDER — SODIUM CHLORIDE 0.9 % IJ SOLN
3.0000 mL | Freq: Two times a day (BID) | INTRAMUSCULAR | Status: DC
  Administered 2013-10-06 – 2013-10-08 (×5): 3 mL via INTRAVENOUS

## 2013-10-06 MED ORDER — HYDRALAZINE HCL 20 MG/ML IJ SOLN: 10.0000 mg | INTRAMUSCULAR | Status: DC | PRN

## 2013-10-06 MED ORDER — DEXTROSE-NACL 5-0.45 % IV SOLN: INTRAVENOUS | Status: DC

## 2013-10-06 MED ORDER — SODIUM CHLORIDE 0.9 % IV SOLN
INTRAVENOUS | Status: DC
  Administered 2013-10-06: 4.3 [IU]/h via INTRAVENOUS
  Filled 2013-10-06: qty 1

## 2013-10-06 MED ORDER — SODIUM CHLORIDE 0.9 % IV SOLN: 1000.0000 mL | Freq: Once | INTRAVENOUS | Status: DC

## 2013-10-06 MED ORDER — SODIUM CHLORIDE 0.9 % IV SOLN: 1000.0000 mL | INTRAVENOUS | Status: DC

## 2013-10-06 MED ORDER — ENOXAPARIN SODIUM 40 MG/0.4ML ~~LOC~~ SOLN
40.0000 mg | SUBCUTANEOUS | Status: DC
  Filled 2013-10-06: qty 0.4

## 2013-10-06 MED ORDER — POTASSIUM CHLORIDE 10 MEQ/100ML IV SOLN
10.0000 meq | INTRAVENOUS | Status: AC
  Administered 2013-10-06 (×4): 10 meq via INTRAVENOUS
  Filled 2013-10-06 (×4): qty 100

## 2013-10-06 MED ORDER — SODIUM CHLORIDE 0.9 % IV SOLN
INTRAVENOUS | Status: DC
  Administered 2013-10-06: 6.4 [IU]/h via INTRAVENOUS
  Filled 2013-10-06: qty 1

## 2013-10-06 MED ORDER — SODIUM CHLORIDE 0.9 % IJ SOLN
3.0000 mL | INTRAMUSCULAR | Status: DC | PRN
  Administered 2013-10-06: 3 mL via INTRAVENOUS

## 2013-10-06 MED ORDER — LEVOTHYROXINE SODIUM 50 MCG PO TABS
50.0000 ug | ORAL_TABLET | Freq: Every day | ORAL | Status: DC
  Administered 2013-10-07 – 2013-10-08 (×2): 50 ug via ORAL
  Filled 2013-10-06 (×3): qty 1

## 2013-10-06 MED ORDER — HYDRALAZINE HCL 20 MG/ML IJ SOLN: 5.0000 mg | INTRAMUSCULAR | Status: DC | PRN

## 2013-10-06 MED ORDER — DEXTROSE-NACL 5-0.45 % IV SOLN
INTRAVENOUS | Status: DC
  Administered 2013-10-06: 1000 mL via INTRAVENOUS

## 2013-10-06 MED ORDER — BD GETTING STARTED TAKE HOME KIT: 3/10ML X 30G SYRINGES
1.0000 | Freq: Once | Status: AC
Start: 2013-10-06 — End: 2013-10-06
  Administered 2013-10-06: 1
  Filled 2013-10-06: qty 1

## 2013-10-06 MED ORDER — HEPARIN SODIUM (PORCINE) 5000 UNIT/ML IJ SOLN
5000.0000 [IU] | Freq: Three times a day (TID) | INTRAMUSCULAR | Status: DC
Start: 2013-10-06 — End: 2013-10-08
  Administered 2013-10-06 – 2013-10-08 (×5): 5000 [IU] via SUBCUTANEOUS
  Filled 2013-10-06 (×9): qty 1

## 2013-10-06 MED ORDER — LIVING WELL WITH DIABETES BOOK
Freq: Once | Status: AC
  Administered 2013-10-06: 11:00:00
  Filled 2013-10-06: qty 1

## 2013-10-06 MED ORDER — INSULIN ASPART 100 UNIT/ML ~~LOC~~ SOLN
5.0000 [IU] | Freq: Once | SUBCUTANEOUS | Status: AC
  Administered 2013-10-06: 5 [IU] via SUBCUTANEOUS

## 2013-10-06 MED ORDER — INSULIN ASPART 100 UNIT/ML ~~LOC~~ SOLN
0.0000 [IU] | Freq: Three times a day (TID) | SUBCUTANEOUS | Status: DC
  Administered 2013-10-06: 2 [IU] via SUBCUTANEOUS
  Administered 2013-10-06: 1 [IU] via SUBCUTANEOUS
  Administered 2013-10-07: 5 [IU] via SUBCUTANEOUS

## 2013-10-06 MED ORDER — DEXTROSE 50 % IV SOLN: 25.0000 mL | INTRAVENOUS | Status: DC | PRN

## 2013-10-06 MED ORDER — SODIUM CHLORIDE 0.9 % IV SOLN
INTRAVENOUS | Status: DC
  Administered 2013-10-06: 03:00:00 via INTRAVENOUS

## 2013-10-06 MED ORDER — SODIUM CHLORIDE 0.9 % IV SOLN
1000.0000 mL | Freq: Once | INTRAVENOUS | Status: AC
  Administered 2013-10-06: 1000 mL via INTRAVENOUS

## 2013-10-06 MED ORDER — SODIUM CHLORIDE 0.9 % IV SOLN: 250.0000 mL | INTRAVENOUS | Status: DC | PRN

## 2013-10-06 MED ORDER — LISINOPRIL 5 MG PO TABS
5.0000 mg | ORAL_TABLET | Freq: Every day | ORAL | Status: DC
  Administered 2013-10-06 – 2013-10-08 (×3): 5 mg via ORAL
  Filled 2013-10-06 (×3): qty 1

## 2013-10-06 MED ORDER — INSULIN GLARGINE 100 UNIT/ML ~~LOC~~ SOLN
10.0000 [IU] | Freq: Every day | SUBCUTANEOUS | Status: DC
  Administered 2013-10-06: 10 [IU] via SUBCUTANEOUS
  Filled 2013-10-06 (×2): qty 0.1

## 2013-10-06 NOTE — H&P (Signed)
-   I have review the data and plan as above and agree. - Transition to lantus, diabetes educator. - Start synthroid. Check a UDS.

## 2013-10-06 NOTE — Plan of Care (Signed)
Dietetic intern note looks good.   Ian Malkin RD, LDN Inpatient Clinical Dietitian Pager: [phone] After Hours Pager: [phone]

## 2013-10-06 NOTE — H&P (Signed)
[facility] History and Physical  Eric Larson WUJ:[id number] DOB: Oct 29, 1960 DOA: 10/05/2013  Referring physician: Dr. Karma GanjaLinker PCP: Pcp Not In System   Chief Complaint: Polyuria   HPI: Eric Larson is a 53 y.o. man with PMH of gout with no recent flare ups, goiter, obesity, likely CKD, and elevated blood pressure with no medical follow up for years who presents with complaints of polydipsia and polyuria for 2 days. He was evaluated in the ED and found to have a blood glucose of 748 with anion gap of 16 but no urine ketones. He denies personal history of diabetes of family history of diabetes. His diet is described as mostly fried foods though he has been avoiding drinking soft drinks. He was started on glucose stabilizer protocol while in the ED and his blood glucose stabilized overnight with closure of his anion gap.   This morning he states that he does not feel as thirsty and has no complaints.   Review of Systems:  Constitutional:  No weight loss, night sweats, Fevers, chills, fatigue. Increased thirst and polydipsia.  HEENT:  No headaches, Difficulty swallowing,Tooth/dental problems,Sore throat,  No sneezing, itching, ear ache, nasal congestion, post nasal drip,  Cardio-vascular:  No chest pain, Orthopnea, PND, swelling in lower extremities, anasarca, dizziness, palpitations  GI:  No heartburn, indigestion, abdominal pain, nausea, vomiting, diarrhea, change in bowel habits, loss of appetite  Resp:  No shortness of breath with exertion or at rest. No excess mucus, no productive cough, No non-productive cough, No coughing up of blood.No change in color of mucus.No wheezing.No chest wall deformity  Skin:  no rash or lesions.  GU:  no dysuria, change in color of urine, no urgency or frequency. No flank pain. Polyuria.  Musculoskeletal:  No joint pain or swelling. No decreased range of motion. No back pain.  Psych:  No change in mood or affect. No depression or anxiety. No  memory loss.   Past Medical History  Diagnosis Date  . Acid reflux   . Gout    Past Surgical History  Procedure Laterality Date  . Thyroid surgery     Social History:  reports that he has never smoked. He has never used smokeless tobacco. He reports that he does not drink alcohol or use illicit drugs.  No Known Allergies  Family History  Problem Relation Age of Onset  . Diabetes Mellitus II Neg Hx   . CAD Neg Hx      Prior to Admission medications   None   Physical Exam: Filed Vitals:   10/06/13 0223  BP: 140/91  Pulse: 60  Temp: 97.1 F (36.2 C)  Resp: 18    BP 140/91  Pulse 60  Temp(Src) 97.1 F (36.2 C) (Oral)  Resp 18  Ht 6' (1.829 m)  Wt 211 lb 9.6 oz (95.981 kg)  BMI 28.69 kg/m2  SpO2 98%  General:  Appears calm and comfortable, lying in bed in NAD Eyes: PERRL, normal lids, irises & conjunctiva ENT: grossly normal hearing, lips & tongue. Missing several teeth Neck: no LAD, masses or thyromegaly appreciated on exam but large neck Cardiovascular: RRR, no m/r/g. Trace pretibial edema bilaterally.  Respiratory: CTA bilaterally, no w/r/r. Normal respiratory effort. Abdomen: soft, ntnd Skin: no rash or induration seen on limited exam Musculoskeletal: grossly normal tone BUE/BLE Psychiatric: grossly normal mood and affect, speech fluent and appropriate Neurologic: grossly non-focal.          Labs on Admission:  Basic Metabolic Panel:  Recent Labs Lab  10/06/13 0007 10/06/13 0200 10/06/13 0458 10/06/13 0710 10/06/13 0822  NA 123* 131* 136* 139 142  K 4.4 4.1 4.2 4.3 3.6*  CL 84* 93* 98 102 103  CO2 23 25 26 29 28   GLUCOSE 748* 583* 393* 241* 155*  BUN 20 18 16 15 14   CREATININE 1.33 1.36* 1.43* 1.40* 1.40*  CALCIUM 9.2 8.7 8.7 8.6 8.7   Liver Function Tests:  Recent Labs Lab 10/06/13 0007  AST 27  ALT 63*  ALKPHOS 75  BILITOT 1.3*  PROT 8.5*  ALBUMIN 3.5   CBC:  Recent Labs Lab 10/05/13 2351 10/06/13 0004 10/06/13 0458  WBC  4.6  --  4.4  NEUTROABS 2.0  --   --   HGB 14.5 15.0 13.3  HCT 39.9 44.0 36.8*  MCV 86.2  --  85.8  PLT 151  --  130*   Cardiac Enzymes:  Recent Labs Lab 10/06/13 0458  TROPONINI <0.30    CBG:  Recent Labs Lab 10/06/13 0501 10/06/13 0606 10/06/13 0704 10/06/13 0812 10/06/13 0904  GLUCAP 341* 301* 280* 174* 150*    Radiological Exams on Admission: No results found.  EKG: none  Assessment/Plan Principal Problem:   Type 2 diabetes mellitus with hyperosmolar nonketotic hyperglycemia Active Problems:   Elevated blood pressure   History of gout   History of goiter   1. Nonketotic hyperosmolar hyperglycemia, likely due to DM2 - Initial blood glucose of 748 with AG of 16, no ketones in his urine. He has no PCP and no medical follow up in years. He presents with polydipsia and polyuria secondary to hyperglycemia. DM2 is the most likely etiology for his hyperglycemia given his obesity and diet. He was promptly started on glucose stabilizer protocol with insulin drip and IV fluids including D51/2 NS  and his anion gap has now closed. His potassium has remained stable after initial supplementation. Lipid panel noted with LDL of 86 at target but HDL of 25 and trig of 377. He will certainly need dietary and lifestyle modifications.        -insulin drip discontinued with gap closed per 2 sequential BMETs       -discontinued IVF      -Lantus 10 units SQ once this morning      -Start Carb mod diet 1 hour after Lantus is given      -SSI      -Consult to diabetes educator      -Follow up on Hemoglobin A1C      -Patient needs a PCP for outpatient follow up  2.  History of elevated blood pressure- not on medications at home. His BP has fluctuated and has been as high as 155/92.      -Will start lisinopril 5mg  and monitor     -hydralazine IV PRN for SBP>180     -PCP follow up for blood pressure monitoring.   3. CKD stage 2 - Patient has had baseline creatinine of 1.4 based on labs  values done during ED visits in the past year. Creatinine on presentation at 1.4.    -Continue monitoring Cr  4. History of goiter - He is status post thyroidectomy as evidenced per CT neck on 01/30/13.   -Follow up on TSH  5. Hx of gout -No recent flare up, not on medications for this at home.   6. Acid reflux, GERD - No active symptoms, not on PPI at home.   Consults: none  Code Status: Full Family Communication: None, to patient only  Disposition Plan: Anticipated discharge tomorrow  Time spent: 40 minutes  Ky Barban, MD PG-II [facility] Internal Medicine Teaching Program

## 2013-10-06 NOTE — ED Provider Notes (Addendum)
CSN: [id number]     Arrival date & time 10/05/13  2301 History   First MD Initiated Contact with Patient 10/05/13 2321     Chief Complaint  Patient presents with  . Urinary Frequency   (Consider location/radiation/quality/duration/timing/severity/associated sxs/prior Treatment) HPI  Eric Larson is a 53 y.o. male who presents to the Emergency Department complaining of urinary frequency that began 2 weeks ago and is progressively-worsening, with associated visual changes and polydipsia. States he has had blurred vision for a week.  Pt states he was urinating every hour but now every 30 min.  He also complains of fatigue due to lack of sleep associated with urinating frequently. He has lost weight from 230 pounds to 215 pounds over 2 months.  He denies personal or family h/o diabetes. He does not take any medications regularly. He does not have a PCP  No dysuria, penile discharge, penile pain, scrotal swelling, CP, SOB, abdominal pain, nausea, vomiting, diarrhea, weakness or numbness.  Sometimes when he wakes up he feels "I can't move right or I can't walk right" for the past week, lasts approximately 15 seconds.   Past Medical History  Diagnosis Date  . Acid reflux   . Gout    Past Surgical History  Procedure Laterality Date  . Thyroid surgery     No family history on file. History  Substance Use Topics  . Smoking status: Never Smoker   . Smokeless tobacco: Never Used  . Alcohol Use: No    Review of Systems  All other systems reviewed and are negative.    Allergies  Review of patient's allergies indicates no known allergies.  Home Medications  No current outpatient prescriptions on file. BP 136/95  Pulse 73  Temp(Src) 97.7 F (36.5 C) (Oral)  Resp 16  SpO2 99% Physical Exam  Nursing note and vitals reviewed. Constitutional: He is oriented to person, place, and time. He appears well-developed and well-nourished. No distress.  Awake, alert, nontoxic appearance   HENT:  Head: Atraumatic.  Right Ear: External ear normal.  Left Ear: External ear normal.  Mouth/Throat: Oropharynx is clear and moist.  Eyes: Conjunctivae and EOM are normal. Pupils are equal, round, and reactive to light. Right eye exhibits no discharge. Left eye exhibits no discharge.  Neck: Normal range of motion. Neck supple.  Cardiovascular: Normal rate and regular rhythm.   Pulmonary/Chest: Effort normal. No respiratory distress. He exhibits no tenderness.  Abdominal: Soft. There is no tenderness. There is no rebound.  Musculoskeletal: He exhibits no tenderness.  ROM appears intact, no obvious focal weakness  5/5 strength to all 4 extremities.    Neurological: He is alert and oriented to person, place, and time. He has normal strength. No cranial nerve deficit or sensory deficit. He displays a negative Romberg sign. Coordination and gait normal. GCS eye subscore is 4. GCS verbal subscore is 5. GCS motor subscore is 6.  Normal speech, speech is goal oriented.    Normal finger to nose, heels to shin, normal gait.    Skin: Skin is warm and dry. No rash noted.  Psychiatric: He has a normal mood and affect.    ED Course  Procedures (including critical care time)  12:19 AM Patient presents with polyuria, polydipsia, weight loss, generalized fatigue, and vision changes suggestive of undiagnosed diabetes. He had an initial CBG greater than 600. Labs are significant for hyponatremia with a sodium of 127. He has no evidence of anion gap concerning for DKA. UA without ketone. He is  mentating appropriately, doubt HONKS.  vision is 20/40 with prescription glasses.  Since patient has newly diagnosed diabetes, will consult for admission for further management. Patient agrees to plan.  Care discussed with Dr. Karma Ganja.  Will place pt on glucostabilizer to control his hyperglycemia.    1:12 AM i have consulted with [facility], Dr. Toniann Fail who agrees to admit pt to tele, team 10 under  his care.  He also request for an ABG which i'm happy to order.    CRITICAL CARE Performed by: Fayrene Helper Total critical care time: 30 min Critical care time was exclusive of separately billable procedures and treating other patients. Critical care was necessary to treat or prevent imminent or life-threatening deterioration. Critical care was time spent personally by me on the following activities: development of treatment plan with patient and/or surrogate as well as nursing, discussions with consultants, evaluation of patient's response to treatment, examination of patient, obtaining history from patient or surrogate, ordering and performing treatments and interventions, ordering and review of laboratory studies, ordering and review of radiographic studies, pulse oximetry and re-evaluation of patient's condition.   Labs Review Labs Reviewed  URINALYSIS, ROUTINE W REFLEX MICROSCOPIC - Abnormal; Notable for the following:    Specific Gravity, Urine <1.005 (*)    Glucose, UA >1000 (*)    All other components within normal limits  GLUCOSE, CAPILLARY - Abnormal; Notable for the following:    Glucose-Capillary >600 (*)    All other components within normal limits  CBC WITH DIFFERENTIAL - Abnormal; Notable for the following:    MCHC 36.3 (*)    All other components within normal limits  POCT I-STAT, CHEM 8 - Abnormal; Notable for the following:    Sodium 127 (*)    Chloride 92 (*)    Creatinine, Ser 1.40 (*)    Glucose, Bld >700 (*)    All other components within normal limits  URINE MICROSCOPIC-ADD ON  COMPREHENSIVE METABOLIC PANEL  KETONES, QUALITATIVE  HEMOGLOBIN A1C  BASIC METABOLIC PANEL  BLOOD GAS, ARTERIAL   Imaging Review No results found.  EKG Interpretation   None       MDM   1. Hyperglycemia without ketosis   2. Renal insufficiency    BP 136/95  Pulse 73  Temp(Src) 97.7 F (36.5 C) (Oral)  Resp 16  SpO2 99%  I have reviewed nursing notes and vital  signs.  I reviewed available ER/hospitalization records thought the EMR     Fayrene Helper, PA-C 10/06/13 0115  Fayrene Helper, PA-C 10/13/13 1455

## 2013-10-06 NOTE — ED Notes (Signed)
Patient with urinary frequency for about two weeks along with thirst.  Patient states that c/o dizziness and changed in vision.  Patient is alert and oriented x 4.  Patient is ambulatory.  Patient denies any CP or any other types of pain.

## 2013-10-06 NOTE — Progress Notes (Signed)
Pt arrived to floor in NAD, pt on insulin drip. Pt oriented to room and floor. Will continue to monitor. Huel Coventry, RN

## 2013-10-06 NOTE — ED Notes (Addendum)
Critical lab value glucose 748 reported to Fayrene Helper PA

## 2013-10-06 NOTE — Progress Notes (Signed)
I cosign with Laura Caldwell Student RN on all assessments, medication administration, intake and output, etc for this shift. Janaiya Beauchesne A, RN  

## 2013-10-06 NOTE — Plan of Care (Signed)
Problem: Food- and Nutrition-Related Knowledge Deficit (NB-1.1) Goal: Nutrition education Formal process to instruct or train a patient/client in a skill or to impart knowledge to help patients/clients voluntarily manage or modify food choices and eating behavior to maintain or improve health. Outcome: Completed/Met Date Met:  10/06/13 Nutrition Education Note  Dietetic intern consulted for nutrition education regarding new onset diabetes.  Dietetic intern provided "Carbohydrate Counting for Patients with Diabetes" from the Academy of Nutrition and Dietetics. Dietetic intern pointed out sources of carbohydrate in the diet. Discouraged intake of sugar sweetened beverages and concentrated sweets. Patient stated that he often skips breakfast and consumes 2 meals per day. Encouraged patient to eat 3 meals a day with 2 small snacks. Encouraged patient to pair carbohydrate foods with fiber and protein and to eat balanced meals with fruits and vegetables.   Dietetic intern discussed why it is important for patient to adhere to diet recommendations, and emphasized the role of carbohydrates on blood glucose control. Patient was receptive to diet education. Teach back method used.  Expect fair compliance.  Body mass index is 28.69 kg/(m^2). Patient meets criteria for overweight based on current BMI.  Current diet order is CHO modified. Patient is consuming approximately 100% of meals at this time. Labs and medications reviewed. Patient reports 8 lb weight loss over the past month, however dietetic intern suspects this is related to new onset diabetes. No further nutrition interventions warranted at this time. If additional nutrition issues arise, please re-consult RD.  Claudell Kyle, Dietetic Intern Pager: [phone]

## 2013-10-06 NOTE — ED Provider Notes (Signed)
Medical screening examination/treatment/procedure(s) were performed by non-physician practitioner and as supervising physician I was immediately available for consultation/collaboration.  EKG Interpretation   None        Ethelda Chick, MD 10/06/13 [phone]

## 2013-10-06 NOTE — Progress Notes (Signed)
Inpatient Diabetes Program Recommendations  AACE/ADA: New Consensus Statement on Inpatient Glycemic Control (2013)  Target Ranges:  Prepandial:   less than 140 mg/dL      Peak postprandial:   less than 180 mg/dL (1-2 hours)      Critically ill patients:  140 - 180 mg/dL   Reason for Visit: New onset diabetes  Diabetes history: No history of diabetes in family Outpatient Diabetes medications: None Current orders for Inpatient glycemic control: Lantus 10 units daily and Novolog SENSITIVE correction scale TID.    Note: Talked with patient about the new onset of diabetes.  States that no one in his family has diabetes. States that he eats out all the time and needs instruction on a meal plan to follow. Dietician consult ordered.  Care management consult ordered since patient does not have insurance or PCP.  Will need to be followed outside the hospital.  Living Well with Diabetes booklet ordered from pharmacy.  Will need to purchase a home blood glucose meter and strips, lancets.  Suggested the [employer] meter and strips. Staff RN to have patient watch DM videos # [id number], give DM Mosby notes before discharge, and have patient practice giving own insulin injections and how to check own CBGs. BD insulin  3/10 cc syringe starter kit ordered.  Will continue to follow while in hospital.  Harvel Ricks RN BSN CDE

## 2013-10-06 NOTE — H&P (Addendum)
[facility] History and Physical  Eric GamblerRufus Philipson ZDG:[id number] DOB: 06-03-1961 DOA: 10/05/2013  Referring physician: EDP PCP: Pcp Not In System   Chief Complaint: Polyuria and Polydipsia.  HPI: Eric Larson is a 53 y.o. male with no significant past medical history presented with complaints of polyuria and polydipsia. Patient has been having these symptoms for over one week. Patient has been feeling fatigued. Denies any chest pain shortness of breath nausea vomiting or diarrhea. In the ER patient was found to have elevated blood sugar more than 700. Had anion gap but urine did not ketones. ABG and acetone are pending. Patient has been started on iv insulin infusion and iv fluids and admitted for further management.    Review of Systems: As presented in the history of presenting illness, rest negative.  Past Medical History  Diagnosis Date  . Acid reflux   . Gout    Past Surgical History  Procedure Laterality Date  . Thyroid surgery     Social History:  reports that he has never smoked. He has never used smokeless tobacco. He reports that he does not drink alcohol or use illicit drugs. Where does patient live Home Can patient participate in ADLs? Yes.  No Known Allergies  Family History:  Family History  Problem Relation Age of Onset  . Diabetes Mellitus II Neg Hx   . CAD Neg Hx      Prior to Admission medications   Not on File    Physical Exam: Filed Vitals:   10/05/13 2308 10/06/13 0100  BP: 136/95 155/92  Pulse: 73 55  Temp: 97.7 F (36.5 C)   TempSrc: Oral   Resp: 16 20  SpO2: 99% 95%     General:  Well developed and nourished.  Eyes: anicteric no pallor.  ENT: no discharge from the ears eyes nose or mouth.  Neck: no mass felt.  Cardiovascular: S1S2 heard.  Respiratory: No rhonchi or crepitations.  Abdomen: Soft non tender bowel sounds present. No guarding or rigidity.  Skin: No rash.  Musculoskeletal: No edema.  Psychiatric: Appears  normal.  Neurologic: Alert awake oriented to time place and person. No focal deficits.  Labs on Admission:  Basic Metabolic Panel:  Recent Labs Lab 10/06/13 0004 10/06/13 0007  NA 127* 123*  K 4.5 4.4  CL 92* 84*  CO2  --  23  GLUCOSE >700* 748*  BUN 20 20  CREATININE 1.40* 1.33  CALCIUM  --  9.2   Liver Function Tests:  Recent Labs Lab 10/06/13 0007  AST 27  ALT 63*  ALKPHOS 75  BILITOT 1.3*  PROT 8.5*  ALBUMIN 3.5   No results found for this basename: LIPASE, AMYLASE,  in the last 168 hours No results found for this basename: AMMONIA,  in the last 168 hours CBC:  Recent Labs Lab 10/05/13 2351 10/06/13 0004  WBC 4.6  --   NEUTROABS 2.0  --   HGB 14.5 15.0  HCT 39.9 44.0  MCV 86.2  --   PLT PENDING  --    Cardiac Enzymes: No results found for this basename: CKTOTAL, CKMB, CKMBINDEX, TROPONINI,  in the last 168 hours  BNP (last 3 results) No results found for this basename: PROBNP,  in the last 8760 hours CBG:  Recent Labs Lab 10/05/13 2342  GLUCAP >600*    Radiological Exams on Admission: No results found.  EKG: Independently reviewed.  Assessment/Plan Principal Problem:   DKA (diabetic ketoacidoses) Active Problems:   Elevated blood pressure  History of gout   History of goiter   1. Sever Hyperglycemia probably Hyperosmolar Non ketotic DM vs DKA - since patients anion gap is elevated I have started patient on iv Insulin DKA protocol. Continue aggressive iv hydration. Closely follow metabolic panel. Check hemoglobin A1C. Check acetone. Once blood sugar improves change to subcutaneous insulin.  2. Elevated blood pressure - closely follow blood pressure trends. May need definite antihypertensives. 3. Hyponatremia - from dehydration and hyperglycemia. I think patients hyponatremia will improve with correction of glucose and hydration. 4. Mildly elevated creatinine - Follow metabolic panel. 5. History of goitre s/p surgery - Check  TSH. 6. History of gout - presently asymptomatic. 7. Mildly elevated LFT'S - . Follow LFT's.Further work up as outpatient. 8. Mild anemia - follow CBC. Will need outpatient workup.  Platelet counts are pending.    Code Status: Full code. Family Communication: None. Disposition Plan: Admit to inpatient.   KAKRAKANDY,ARSHAD N. [facility] Pager [phone].  If 7PM-7AM, please contact night-coverage [email or web address] Password [email or web address] 10/06/2013, 1:46 AM

## 2013-10-06 NOTE — Progress Notes (Signed)
Utilization Review Completed Maryalyce Sanjuan J. Lynnita Somma, RN, BSN, NCM [phone]  

## 2013-10-06 NOTE — Progress Notes (Signed)
Notified on call hospitalist of elevated CBG of 357 and has no bedtime (HS) coverage. Orders given to administer 5 units of Novolog. 5 Units of Novolog administered as ordered. Patient is asymptomatic. Will continue to monitor to end of shift.

## 2013-10-07 DIAGNOSIS — N289 Disorder of kidney and ureter, unspecified: Secondary | ICD-10-CM

## 2013-10-07 DIAGNOSIS — N179 Acute kidney failure, unspecified: Secondary | ICD-10-CM | POA: Diagnosis present

## 2013-10-07 LAB — BASIC METABOLIC PANEL
BUN: 14 mg/dL (ref 6–23)
CALCIUM: 8.4 mg/dL (ref 8.4–10.5)
CHLORIDE: 101 meq/L (ref 96–112)
CO2: 22 meq/L (ref 19–32)
Creatinine, Ser: 1.18 mg/dL (ref 0.50–1.35)
GFR calc Af Amer: 80 mL/min — ABNORMAL LOW (ref >90)
GFR calc non Af Amer: 69 mL/min — ABNORMAL LOW (ref >90)
Glucose, Bld: 289 mg/dL — ABNORMAL HIGH (ref 70–99)
Potassium: 3.9 mEq/L (ref 3.7–5.3)
SODIUM: 136 meq/L — AB (ref 137–147)

## 2013-10-07 LAB — GLUCOSE, CAPILLARY
GLUCOSE-CAPILLARY: 293 mg/dL — AB (ref 70–99)
GLUCOSE-CAPILLARY: 303 mg/dL — AB (ref 70–99)
GLUCOSE-CAPILLARY: 385 mg/dL — AB (ref 70–99)
Glucose-Capillary: 294 mg/dL — ABNORMAL HIGH (ref 70–99)
Glucose-Capillary: 325 mg/dL — ABNORMAL HIGH (ref 70–99)

## 2013-10-07 MED ORDER — INSULIN ASPART PROT & ASPART (70-30 MIX) 100 UNIT/ML ~~LOC~~ SUSP: 14.0000 [IU] | Freq: Two times a day (BID) | SUBCUTANEOUS | Status: DC

## 2013-10-07 MED ORDER — INSULIN ASPART PROT & ASPART (70-30 MIX) 100 UNIT/ML ~~LOC~~ SUSP
40.0000 [IU] | Freq: Every day | SUBCUTANEOUS | Status: DC
  Administered 2013-10-08: 40 [IU] via SUBCUTANEOUS

## 2013-10-07 MED ORDER — INSULIN ASPART 100 UNIT/ML ~~LOC~~ SOLN
5.0000 [IU] | Freq: Once | SUBCUTANEOUS | Status: AC
  Administered 2013-10-07: 5 [IU] via SUBCUTANEOUS

## 2013-10-07 MED ORDER — INSULIN ASPART PROT & ASPART (70-30 MIX) 100 UNIT/ML ~~LOC~~ SUSP
20.0000 [IU] | Freq: Every day | SUBCUTANEOUS | Status: DC
  Administered 2013-10-07: 20 [IU] via SUBCUTANEOUS
  Filled 2013-10-07: qty 10

## 2013-10-07 MED ORDER — INSULIN ASPART PROT & ASPART (70-30 MIX) 100 UNIT/ML ~~LOC~~ SUSP: 12.0000 [IU] | Freq: Two times a day (BID) | SUBCUTANEOUS | Status: DC

## 2013-10-07 MED ORDER — INSULIN ASPART PROT & ASPART (70-30 MIX) 100 UNIT/ML ~~LOC~~ SUSP
20.0000 [IU] | Freq: Every day | SUBCUTANEOUS | Status: DC
  Administered 2013-10-07: 20 [IU] via SUBCUTANEOUS

## 2013-10-07 MED ORDER — INSULIN ASPART PROT & ASPART (70-30 MIX) 100 UNIT/ML ~~LOC~~ SUSP
10.0000 [IU] | Freq: Every day | SUBCUTANEOUS | Status: DC
  Filled 2013-10-07: qty 10

## 2013-10-07 NOTE — Progress Notes (Addendum)
Inpatient Diabetes Program Recommendations  AACE/ADA: New Consensus Statement on Inpatient Glycemic Control (2013)  Target Ranges:  Prepandial:   less than 140 mg/dL      Peak postprandial:   less than 180 mg/dL (1-2 hours)      Critically ill patients:  140 - 180 mg/dL   Referral received regarding elevated A1C of 11.9 and new to insulin Agree with starting dose of 12 units 70/30 bid. May need a more aggressive correction scale at this point, moderate tidwc.  Diabetes coordinator saw pt yesterday and ordered insulin teaching and starter kit per bedside RN as well as other DM network ed'l videos. Case mgnt consult also ordered for finding a PCP and education for f/up. Thank you, Rosita Kea, RN, CNS, Diabetes Coordinator [phone]) Ad: Another referral received re: 'new diabetic' 'new to insulin'. Insulin instruction has been done by RN and was seen yesterday by Diabetes Coordinator.  All teaching has been ordered. AC

## 2013-10-07 NOTE — Progress Notes (Signed)
RD re-consulted regarding diabetic pt, new to insulin. Dietetic intern provided diet education regarding diabetes yesterday 10/06/13.  RD reviewed portion sizes and carbohydrate counting with patient today. Discussed pt's goal for carbohydrate intake per meal. Encouraged intake of vegetables and lean, low fat meats. Pt has no additional questions at this time. RD name and contact information provided. Encouraged pt to call or e-mail with any questions or concerns.   Ian Malkin RD, LDN Inpatient Clinical Dietitian Pager: [phone] After Hours Pager: [phone]

## 2013-10-07 NOTE — Progress Notes (Signed)
[facility] PROGRESS NOTE  Eric Larson CBJ:[id number] DOB: [birth year] DOA: 10/05/2013 PCP: Pcp Not In System  Assessment/Plan: 1. Nonketotic hyperosmolar hyperglycemia, likely due to DM2 - Resolved.   2. DM2- Newly diagnosed. Patient does not have family history of diabetes but is obese with high calorie/high carb diet prior to admission. His CBGs have been in the 200-300s with Lantus 10 units and SSI.  -Start Novolog 70/30 20 units and 10 units with dinner.  -Continue CBG q4h -Diabetes outpatient teaching provided, patient will follow up with [facility] for diabetic supplies  3. History of elevated blood pressure- not on medications at home. His BP has fluctuated and has been as high as 155/92.  -Continue Lisinopril 5mg  and monitor  -hydralazine IV PRN for SBP>180  -PCP follow up for blood pressure monitoring.   4. CKD stage 2 - Patient has had baseline creatinine of 1.4 based on labs values done during ED visits in the past year. Cr of 1.18 today.   -Continue monitoring Cr   5. History of goiter - He is status post thyroidectomy as evidenced per CT neck on 01/30/13.  -Follow up on TSH   6. Hx of gout -No recent flare up, not on medications for this at home.   7. Acid reflux, GERD - No active symptoms, not on PPI at home.   Code Status: Full Family Communication: None, to patient  Disposition Plan: Discharge today   Consultants:  Diabetes educator   Antibiotics:  None   Subjective: No complaints today. He watched 10 videos on diabetes education and feels like his questions have been answered. He is optimistic about changing his diet.   Objective: Filed Vitals:   10/06/13 1429 10/06/13 2121 10/07/13 0455 10/07/13 0900  BP: 113/59 119/66 112/56 111/67  Pulse: 56 53 58 80  Temp: 97.6 F (36.4 C) 98.1 F (36.7 C) 97.9 F (36.6 C) 97.9 F (36.6 C)  TempSrc: Oral Oral Oral Oral  Resp: 18 18 18 18   Height:      Weight:   213 lb 6.5 oz (96.8 kg)    SpO2: 97% 97% 96% 97%    Intake/Output Summary (Last 24 hours) at 10/07/13 1318 Last data filed at 10/07/13 1022  Gross per 24 hour  Intake    963 ml  Output    475 ml  Net    488 ml   Filed Weights   10/06/13 0223 10/07/13 0455  Weight: 211 lb 9.6 oz (95.981 kg) 213 lb 6.5 oz (96.8 kg)    Exam:   General:  Resting in bed, in NAD  Cardiovascular: RRR, S1, S2, no m/r/g  Respiratory: CTAB, no respiratory distress  Abdomen: Soft, non tender, non distended, BS+   Extremities: Warm and well perfused, no pitting edema  Neurologic:  A&O x3, follows commands appropriately, moves all 4 extremities voluntarily   Data Reviewed: Basic Metabolic Panel:  Recent Labs Lab 10/06/13 0200 10/06/13 0458 10/06/13 0710 10/06/13 0822 10/07/13 0354  NA 131* 136* 139 142 136*  K 4.1 4.2 4.3 3.6* 3.9  CL 93* 98 102 103 101  CO2 25 26 29 28 22   GLUCOSE 583* 393* 241* 155* 289*  BUN 18 16 15 14 14   CREATININE 1.36* 1.43* 1.40* 1.40* 1.18  CALCIUM 8.7 8.7 8.6 8.7 8.4   Liver Function Tests:  Recent Labs Lab 10/06/13 0007  AST 27  ALT 63*  ALKPHOS 75  BILITOT 1.3*  PROT 8.5*  ALBUMIN 3.5   CBC:  Recent Labs Lab 10/05/13 2351 10/06/13 0004 10/06/13 0458  WBC 4.6  --  4.4  NEUTROABS 2.0  --   --   HGB 14.5 15.0 13.3  HCT 39.9 44.0 36.8*  MCV 86.2  --  85.8  PLT 151  --  130*   Cardiac Enzymes:  Recent Labs Lab 10/06/13 0458  TROPONINI <0.30   CBG:  Recent Labs Lab 10/06/13 1138 10/06/13 2124 10/07/13 0018 10/07/13 0501 10/07/13 1114  GLUCAP 158* 357* 293* 294* 303*    Studies: No results found.  Scheduled Meds: . heparin subcutaneous  5,000 Units Subcutaneous Q8H  . insulin aspart protamine- aspart  10 Units Subcutaneous Q supper  . insulin aspart protamine- aspart  20 Units Subcutaneous Q breakfast  . levothyroxine  50 mcg Oral QAC breakfast  . lisinopril  5 mg Oral Daily  . sodium chloride  3 mL Intravenous Q12H   Continuous Infusions:    Principal Problem:   Type 2 diabetes mellitus with hyperosmolar nonketotic hyperglycemia Active Problems:   Elevated blood pressure   History of gout   History of goiter   CKD (chronic kidney disease)   Obesity   GERD (gastroesophageal reflux disease)    Time spent: 40 minutes. Greater than 50% of this time was spent in direct contact with the patient coordinating care.    Ky BarbanKennerly, Kierrah Kilbride D, MD PGY-II [facility]

## 2013-10-07 NOTE — Progress Notes (Addendum)
-   Patient seen and examined with Dr. Joesph Fillers ( resident). We discussed all aspects of the encounter. I agree with the assessment and plan as stated above - Change to lasix 70/30. [facility] with [facility]. - Diabetes educator as an outpatient. - Resume Synthroid.

## 2013-10-07 NOTE — Progress Notes (Signed)
Pt started watching all Diabetic Videos in the 500 series on Diabetes. Reviewing s/s of hypo and hyper glycemia. Pt given exit care notes on Diabetes- see care plan. Switched to 70/30 Insulin for pt's cost effectiveness.Pt gave his own injection - observed good technique.

## 2013-10-08 DIAGNOSIS — N179 Acute kidney failure, unspecified: Secondary | ICD-10-CM

## 2013-10-08 DIAGNOSIS — E039 Hypothyroidism, unspecified: Secondary | ICD-10-CM | POA: Diagnosis present

## 2013-10-08 LAB — BASIC METABOLIC PANEL
BUN: 14 mg/dL (ref 6–23)
CALCIUM: 9 mg/dL (ref 8.4–10.5)
CHLORIDE: 102 meq/L (ref 96–112)
CO2: 26 mEq/L (ref 19–32)
CREATININE: 1.26 mg/dL (ref 0.50–1.35)
GFR calc Af Amer: 74 mL/min — ABNORMAL LOW (ref >90)
GFR calc non Af Amer: 64 mL/min — ABNORMAL LOW (ref >90)
Glucose, Bld: 309 mg/dL — ABNORMAL HIGH (ref 70–99)
Potassium: 4.2 mEq/L (ref 3.7–5.3)
Sodium: 139 mEq/L (ref 137–147)

## 2013-10-08 LAB — GLUCOSE, CAPILLARY
GLUCOSE-CAPILLARY: 287 mg/dL — AB (ref 70–99)
Glucose-Capillary: 295 mg/dL — ABNORMAL HIGH (ref 70–99)

## 2013-10-08 MED ORDER — LISINOPRIL 5 MG PO TABS: 5.0000 mg | ORAL_TABLET | Freq: Every day | ORAL | Status: DC

## 2013-10-08 MED ORDER — INSULIN NPH ISOPHANE & REGULAR (70-30) 100 UNIT/ML ~~LOC~~ SUSP: SUBCUTANEOUS | Status: DC

## 2013-10-08 MED ORDER — LEVOTHYROXINE SODIUM 50 MCG PO TABS: 50.0000 ug | ORAL_TABLET | Freq: Every day | ORAL | Status: DC

## 2013-10-08 MED ORDER — GLUCOSE BLOOD VI STRP: ORAL_STRIP | Status: DC

## 2013-10-08 MED ORDER — BLOOD GLUCOSE METER KIT: PACK | Status: DC

## 2013-10-08 MED ORDER — LANCETS MISC: Status: DC

## 2013-10-08 NOTE — Discharge Summary (Signed)
Physician Discharge Summary  Eric Larson EUM:[id number] DOB: October 10, 1960 DOA: 10/05/2013  PCP: Pcp Not In System  Admit date: 10/05/2013 Discharge date: 10/08/2013  Time spent: 35 minutes  Recommendations for Outpatient Follow-up:  -Repeat TSH level in 6 weeks -CBG monitoring, assure compliance to lifestyle modification and insulin, diabetic eye exam, diabetic foot exam.  -Check urine microalbumin/creatinie ratio -Patient is interested in the [employer] -Patient has Dental Caries, interested in assistance with dental cleaning.   Discharge Diagnoses:  Principal Problem:   Type 2 diabetes mellitus with hyperosmolar nonketotic hyperglycemia Active Problems:   Elevated blood pressure   History of gout   History of goiter   Obesity   GERD (gastroesophageal reflux disease)   AKI (acute kidney injury)   Unspecified hypothyroidism   Discharge Condition: Good  Filed Weights   10/06/13 0223 10/07/13 0455 10/08/13 0523  Weight: 211 lb 9.6 oz (95.981 kg) 213 lb 6.5 oz (96.8 kg) 211 lb 14.4 oz (96.117 kg)    History of present illness:  [name] is a 53 y.o. man with PMH of gout with no recent flare ups, goiter, obesity, likely CKD, and elevated blood pressure with no medical follow up for years who presents with complaints of polydipsia and polyuria for 2 days. He was evaluated in the ED and found to have a blood glucose of 748 with anion gap of 16 but no urine ketones. He denies personal history of diabetes of family history of diabetes. His diet is described as mostly fried foods though he has been avoiding drinking soft drinks. He was started on glucose stabilizer protocol while in the ED and his blood glucose stabilized overnight with closure of his anion gap.  This morning he states that he does not feel as thirsty and has no complaints.    Hospital Course:  1. Nonketotic hyperosmolar hyperglycemia, likely due to DM2 - Resolved prior to discharge. He presented with blood  glucose of 748 and AG of 16 with no ketones in his urine. He has no PCP and had no medical follow up in years. He presented to the ED with polydipsia and polyuria secondary to hyperglycemia. He has no family history of DM2 but he has obesity and high carb diet as risk factors for diabetes. He was promptly started on glucose stabilizer protocol with insulin drip and IV fluids including D51/2 NS and his anion gap closed. His potassium has remained stable after initial supplementation. Lipid panel noted with LDL of 86 at target but HDL of 25 and trig of 377, a statin was not started. He will certainly need dietary and lifestyle modifications. He has CBGs<300 with Novolog 70/30 and will be discharge with prescription for Novolin 70/30 50 units with breakfast and 30 units with dinner. He had extensive diabetes education while inpatient and he plans to change his diet. He has follow up with his new PCP the day after his discharge.   2. DM2- Newly diagnosed. Hemoglobin A1C of 11.9%. See above for further discussion.   3. History of elevated blood pressure- Not on medications at home. His BP has fluctuated and has been as high as 155/92. He was started on Lisinopril 28m with BP well controlled in the 120s/60s range. He will need further monitoring of his BP in the outpatient setting.   4. CKD stage 2 - Patient has had baseline creatinine of 1.4 based on labs values done during ED visits in the past year. Cr of 1.26 on the day of discharge. He will  need continued monitoring of his Cr in the outpatient setting.   5. History of goiter - He is status post thyroidectomy as evidenced per CT neck on 01/30/13. His TSH was elevated to 18.966 on presentation, repeat at 15.323, fT4 wnl. He was started on synthroid 33mg daily, he will need repeat TSH in 6 weeks for possible dose adjustment.   6. Hx of gout -No recent flare up, not on medications for this at home.   7. Acid reflux, GERD - No active symptoms, not on PPI at  home.   8. Dental caries - Patient has several missing teeth and has not had dental follow up in years. [facility] is aware that he will need to call on June 1st for a cleaning in the fall.    Procedures:  None  Consultations:  Diabetes educator   Discharge Instructions      Discharge Orders   Future Appointments Provider Department Dept Phone   10/09/2013 11:30 AM [facility] Covering Provider [facility] [phone]  Future Orders Complete By Expires   Diet Carb Modified  As directed    Increase activity slowly  As directed        Medication List         Blood Glucose Meter kit  True result meter. Use as instructed. Check your blood sugar 3 times per day before meals and as needed if you have symptoms of low blood sugar. ICD code 250.0     glucose blood test strip  Commonly known as:  TRUETEST TEST  Use as instructed     insulin NPH-regular Human (70-30) 100 UNIT/ML injection  Commonly known as:  NOVOLIN 70/30  Inject 50 units of insulin into the skin with breakfast and 30 units of insulin into skin with dinner. ICD code 250.0     Lancets Misc  Use as directed to check your blood sugar. ICD code 250.0     levothyroxine 50 MCG tablet  Commonly known as:  SYNTHROID, LEVOTHROID  Take 1 tablet (50 mcg total) by mouth daily before breakfast.     lisinopril 5 MG tablet  Commonly known as:  PRINIVIL,ZESTRIL  Take 1 tablet (5 mg total) by mouth daily.       No Known Allergies Follow-up Information   Follow up with [facility]    On 10/09/2013. (@ 11:30. Please take packet and needed information to apply for orange card.  If can not keep this appt, please notify clinic asap.)    Contact information:   [place] [phone]      The results of significant diagnostics from this hospitalization (including imaging, microbiology,  ancillary and laboratory) are listed below for reference.    Significant Diagnostic Studies: No results found.  Microbiology: No results found for this or any previous visit (from the past 240 hour(s)).   Labs: Basic Metabolic Panel:  Recent Labs Lab 10/06/13 0458 10/06/13 0710 10/06/13 0822 10/07/13 0354 10/08/13 0541  NA 136* 139 142 136* 139  K 4.2 4.3 3.6* 3.9 4.2  CL 98 102 103 101 102  CO2 '26 29 28 22 26  ' GLUCOSE 393* 241* 155* 289* 309*  BUN '16 15 14 14 14  ' CREATININE 1.43* 1.40* 1.40* 1.18 1.26  CALCIUM 8.7 8.6 8.7 8.4 9.0   Liver Function Tests:  Recent Labs Lab 10/06/13 0007  AST 27  ALT 63*  ALKPHOS 75  BILITOT 1.3*  PROT 8.5*  ALBUMIN 3.5   CBC:  Recent Labs Lab 10/05/13 2351 10/06/13 0004 10/06/13 0458  WBC 4.6  --  4.4  NEUTROABS 2.0  --   --   HGB 14.5 15.0 13.3  HCT 39.9 44.0 36.8*  MCV 86.2  --  85.8  PLT 151  --  130*   Cardiac Enzymes:  Recent Labs Lab 10/06/13 0458  TROPONINI <0.30   CBG:  Recent Labs Lab 10/07/13 1114 10/07/13 1527 10/07/13 2112 10/08/13 0536 10/08/13 1045  GLUCAP 303* 325* 385* 287* 295*    Signed:  Blain Pais, MD [facility]

## 2013-10-08 NOTE — Discharge Summary (Signed)
-   Patient seen and examined with Dr. Geoffery Lyons.. We discussed all aspects of the encounter. I agree with the assessment and plan as stated above

## 2013-10-08 NOTE — Progress Notes (Signed)
Pt's teeth & gums in very poor condition. Broken teeth and swollen gums with discolored gums noted encourage pt to improve oral hygiene and discussed health benefits & risks of poor oral hygiene. Called [facility] for pt trying to get a cleaning appointment. Talked to dept and pt must call June 1st to register for a cleaning in the fall. Information with phone contact given for pt to follow up.

## 2013-10-08 NOTE — Progress Notes (Signed)
Went over all discharge info with pt including community referrals, home meds, prescriptions, follow up with [facility]

## 2013-10-08 NOTE — Progress Notes (Signed)
Pt d/c per w/c with all belongings including cell phone and charger. Pt has all d/c paperwork, prescriptions, insulin start kit , exit care notes, nutrition notes, diabetes book for reference. Scripts include all for diabetic meter and supplies. Aware he is to [facility] get meds and meter.  Pt in W/C to ED entrance per RN  to security personnel waiting to drive pt to his private vehicle.

## 2013-10-08 NOTE — Progress Notes (Signed)
[facility] PROGRESS NOTE  Eric Larson MAU:[id number] DOB: June 09, 1961 DOA: 10/05/2013 PCP: Pcp Not In System  Assessment/Plan: 1. Nonketotic hyperosmolar hyperglycemia, likely due to DM2 - Resolved.   2. DM2- Newly diagnosed. Patient does not have family history of diabetes but is obese with high calorie/high carb diet prior to admission.  Novolog 70/30 20 units at dinner with CBG of 385 at bedtime and 40 units this morning with breakfast with CBG of 295 before lunch.  -Increase Novolog to 50 units with breakfast and 30 units with dinner -Continue CBG q4h  -Diabetes outpatient teaching provided, patient will follow up with [facility] for diabetic supplies   3. History of elevated blood pressure- not on medications at home. His BP has fluctuated and has been as high as 155/92.  -Continue Lisinopril 5mg  and monitor  -hydralazine IV PRN for SBP>180  -PCP follow up for blood pressure monitoring.   4. CKD stage 2 - Patient has had baseline creatinine of 1.4 based on labs values done during ED visits in the past year. Cr of 1.26 today.  -Continue monitoring Cr   5. History of goiter - He is status post thyroidectomy as evidenced per CT neck on 01/30/13.  -TSH elevated to 18.966, repeat at 15.323, fT4 nl.  -Continue synthroid daily, patient will need repeat TSH in 6 weeks for possible dose adjustment  6. Hx of gout -No recent flare up, not on medications for this at home.   7. Acid reflux, GERD - No active symptoms, not on PPI at home.    Code Status: Full Family Communication: None, to patient only Disposition Plan: Discharge today with outpatient follow up appointment tomorrow. Pt to fill prescriptions at the [facility].    Consultants:  Diabetes educator  Antibiotics:  None  Subjective: No complaints.   Objective: Filed Vitals:   10/07/13 2038 10/08/13 0523 10/08/13 0804 10/08/13 0938  BP: 145/86 110/62  126/62  Pulse: 60 68   65  Temp: 98 F (36.7 C) 97.8 F (36.6 C)  98 F (36.7 C)  TempSrc: Oral Oral  Oral  Resp: 18 18 18 18   Height:      Weight:  211 lb 14.4 oz (96.117 kg)    SpO2: 100% 96%  96%    Intake/Output Summary (Last 24 hours) at 10/08/13 1222 Last data filed at 10/08/13 0900  Gross per 24 hour  Intake   1020 ml  Output   1550 ml  Net   -530 ml   Filed Weights   10/06/13 0223 10/07/13 0455 10/08/13 0523  Weight: 211 lb 9.6 oz (95.981 kg) 213 lb 6.5 oz (96.8 kg) 211 lb 14.4 oz (96.117 kg)    Exam:  General: Resting in bed, in NAD  Cardiovascular: RRR, S1, S2, no m/r/g  Respiratory: CTAB, no respiratory distress  Abdomen: Soft, non tender, non distended, BS+  Extremities: Warm and well perfused, no pitting edema  Neurologic: A&O x3, follows commands appropriately, moves all 4 extremities voluntarily    Data Reviewed: Basic Metabolic Panel:  Recent Labs Lab 10/06/13 0458 10/06/13 0710 10/06/13 0822 10/07/13 0354 10/08/13 0541  NA 136* 139 142 136* 139  K 4.2 4.3 3.6* 3.9 4.2  CL 98 102 103 101 102  CO2 26 29 28 22 26   GLUCOSE 393* 241* 155* 289* 309*  BUN 16 15 14 14 14   CREATININE 1.43* 1.40* 1.40* 1.18 1.26  CALCIUM 8.7 8.6 8.7 8.4 9.0   Liver Function Tests:  Recent Labs Lab 10/06/13 0007  AST 27  ALT 63*  ALKPHOS 75  BILITOT 1.3*  PROT 8.5*  ALBUMIN 3.5   CBC:  Recent Labs Lab 10/05/13 2351 10/06/13 0004 10/06/13 0458  WBC 4.6  --  4.4  NEUTROABS 2.0  --   --   HGB 14.5 15.0 13.3  HCT 39.9 44.0 36.8*  MCV 86.2  --  85.8  PLT 151  --  130*   Cardiac Enzymes:  Recent Labs Lab 10/06/13 0458  TROPONINI <0.30   CBG:  Recent Labs Lab 10/07/13 1114 10/07/13 1527 10/07/13 2112 10/08/13 0536 10/08/13 1045  GLUCAP 303* 325* 385* 287* 295*    Scheduled Meds: . heparin subcutaneous  5,000 Units Subcutaneous Q8H  . insulin aspart protamine- aspart  20 Units Subcutaneous Q supper  . insulin aspart protamine- aspart  40 Units Subcutaneous Q  breakfast  . levothyroxine  50 mcg Oral QAC breakfast  . lisinopril  5 mg Oral Daily  . sodium chloride  3 mL Intravenous Q12H   Continuous Infusions:   Principal Problem:   Type 2 diabetes mellitus with hyperosmolar nonketotic hyperglycemia Active Problems:   Elevated blood pressure   History of gout   History of goiter   Obesity   GERD (gastroesophageal reflux disease)   AKI (acute kidney injury)    Time spent: 40 minutes. Greater than 50% of this time was spent in direct contact with the patient coordinating care.    Ky BarbanKennerly, Adeleine Pask D, MD PGY-II [facility]

## 2013-10-08 NOTE — Progress Notes (Signed)
-   Patient seen and examined with Dr. Garald Braver. We discussed all aspects of the encounter. I agree with the assessment and plan as stated above  - Increase Insulin 70/30. [facility].  - Diabetes educator as an outpatient.

## 2013-10-09 ENCOUNTER — Inpatient Hospital Stay: Payer: Self-pay

## 2013-10-13 NOTE — ED Provider Notes (Signed)
Medical screening examination/treatment/procedure(s) were performed by non-physician practitioner and as supervising physician I was immediately available for consultation/collaboration.     Ethelda Chick, MD 10/13/13 [phone]

## 2013-10-13 NOTE — ED Provider Notes (Signed)
Medical screening examination/treatment/procedure(s) were performed by non-physician practitioner and as supervising physician I was immediately available for consultation/collaboration.     Chaeli Judy K Linker, MD 10/13/13 1502 

## 2013-10-22 ENCOUNTER — Ambulatory Visit: Payer: Self-pay | Attending: Internal Medicine | Admitting: Internal Medicine

## 2013-10-22 ENCOUNTER — Encounter: Payer: Self-pay | Admitting: Internal Medicine

## 2013-10-22 VITALS — BP 144/84 | HR 72 | Temp 98.4°F | Resp 17 | Wt 219.0 lb

## 2013-10-22 DIAGNOSIS — E119 Type 2 diabetes mellitus without complications: Secondary | ICD-10-CM | POA: Insufficient documentation

## 2013-10-22 DIAGNOSIS — N189 Chronic kidney disease, unspecified: Secondary | ICD-10-CM | POA: Insufficient documentation

## 2013-10-22 DIAGNOSIS — E039 Hypothyroidism, unspecified: Secondary | ICD-10-CM | POA: Insufficient documentation

## 2013-10-22 DIAGNOSIS — H538 Other visual disturbances: Secondary | ICD-10-CM | POA: Insufficient documentation

## 2013-10-22 DIAGNOSIS — Z139 Encounter for screening, unspecified: Secondary | ICD-10-CM

## 2013-10-22 MED ORDER — METFORMIN HCL 500 MG PO TABS: 500.0000 mg | ORAL_TABLET | Freq: Two times a day (BID) | ORAL | Status: DC

## 2013-10-22 NOTE — Patient Instructions (Signed)
Diabetes Meal Planning Guide The diabetes meal planning guide is a tool to help you plan your meals and snacks. It is important for people with diabetes to manage their blood glucose (sugar) levels. Choosing the right foods and the right amounts throughout your day will help control your blood glucose. Eating right can even help you improve your blood pressure and reach or maintain a healthy weight. CARBOHYDRATE COUNTING MADE EASY When you eat carbohydrates, they turn to sugar. This raises your blood glucose level. Counting carbohydrates can help you control this level so you feel better. When you plan your meals by counting carbohydrates, you can have more flexibility in what you eat and balance your medicine with your food intake. Carbohydrate counting simply means adding up the total amount of carbohydrate grams in your meals and snacks. Try to eat about the same amount at each meal. Foods with carbohydrates are listed below. Each portion below is 1 carbohydrate serving or 15 grams of carbohydrates. Ask your dietician how many grams of carbohydrates you should eat at each meal or snack. Grains and Starches  1 slice bread.   English muffin or hotdog/hamburger bun.   cup cold cereal (unsweetened).   cup cooked pasta or rice.   cup starchy vegetables (corn, potatoes, peas, beans, winter squash).  1 tortilla (6 inches).   bagel.  1 waffle or pancake (size of a CD).   cup cooked cereal.  4 to 6 small crackers. *Whole grain is recommended. Fruit  1 cup fresh unsweetened berries, melon, papaya, pineapple.  1 small fresh fruit.   banana or mango.   cup fruit juice (4 oz unsweetened).   cup canned fruit in natural juice or water.  2 tbs dried fruit.  12 to 15 grapes or cherries. Milk and Yogurt  1 cup fat-free or 1% milk.  1 cup soy milk.  6 oz light yogurt with sugar-free sweetener.  6 oz low-fat soy yogurt.  6 oz plain yogurt. Vegetables  1 cup raw or  cup  cooked is counted as 0 carbohydrates or a "free" food.  If you eat 3 or more servings at 1 meal, count them as 1 carbohydrate serving. Other Carbohydrates   oz chips or pretzels.   cup ice cream or frozen yogurt.   cup sherbet or sorbet.  2 inch square cake, no frosting.  1 tbs honey, sugar, jam, jelly, or syrup.  2 small cookies.  3 squares of graham crackers.  3 cups popcorn.  6 crackers.  1 cup broth-based soup.  Count 1 cup casserole or other mixed foods as 2 carbohydrate servings.  Foods with less than 20 calories in a serving may be counted as 0 carbohydrates or a "free" food. You may want to purchase a book or computer software that lists the carbohydrate gram counts of different foods. In addition, the nutrition facts panel on the labels of the foods you eat are a good source of this information. The label will tell you how big the serving size is and the total number of carbohydrate grams you will be eating per serving. Divide this number by 15 to obtain the number of carbohydrate servings in a portion. Remember, 1 carbohydrate serving equals 15 grams of carbohydrate. SERVING SIZES Measuring foods and serving sizes helps you make sure you are getting the right amount of food. The list below tells how big or small some common serving sizes are.  1 oz.........4 stacked dice.  3 oz.........Deck of cards.  1 tsp........Tip   of little finger.  1 tbs........Thumb.  2 tbs........Golf ball.   cup.......Half of a fist.  1 cup........A fist. SAMPLE DIABETES MEAL PLAN Below is a sample meal plan that includes foods from the grain and starches, dairy, vegetable, fruit, and meat groups. A dietician can individualize a meal plan to fit your calorie needs and tell you the number of servings needed from each food group. However, controlling the total amount of carbohydrates in your meal or snack is more important than making sure you include all of the food groups at every  meal. You may interchange carbohydrate containing foods (dairy, starches, and fruits). The meal plan below is an example of a 2000 calorie diet using carbohydrate counting. This meal plan has 17 carbohydrate servings. Breakfast  1 cup oatmeal (2 carb servings).   cup light yogurt (1 carb serving).  1 cup blueberries (1 carb serving).   cup almonds. Snack  1 large apple (2 carb servings).  1 low-fat string cheese stick. Lunch  Chicken breast salad.  1 cup spinach.   cup chopped tomatoes.  2 oz chicken breast, sliced.  2 tbs low-fat Italian dressing.  12 whole-wheat crackers (2 carb servings).  12 to 15 grapes (1 carb serving).  1 cup low-fat milk (1 carb serving). Snack  1 cup carrots.   cup hummus (1 carb serving). Dinner  3 oz broiled salmon.  1 cup brown rice (3 carb servings). Snack  1  cups steamed broccoli (1 carb serving) drizzled with 1 tsp olive oil and lemon juice.  1 cup light pudding (2 carb servings). DIABETES MEAL PLANNING WORKSHEET Your dietician can use this worksheet to help you decide how many servings of foods and what types of foods are right for you.  BREAKFAST Food Group and Servings / Carb Servings Grain/Starches __________________________________ Dairy __________________________________________ Vegetable ______________________________________ Fruit ___________________________________________ Meat __________________________________________ Fat ____________________________________________ LUNCH Food Group and Servings / Carb Servings Grain/Starches ___________________________________ Dairy ___________________________________________ Fruit ____________________________________________ Meat ___________________________________________ Fat _____________________________________________ DINNER Food Group and Servings / Carb Servings Grain/Starches ___________________________________ Dairy  ___________________________________________ Fruit ____________________________________________ Meat ___________________________________________ Fat _____________________________________________ SNACKS Food Group and Servings / Carb Servings Grain/Starches ___________________________________ Dairy ___________________________________________ Vegetable _______________________________________ Fruit ____________________________________________ Meat ___________________________________________ Fat _____________________________________________ DAILY TOTALS Starches _________________________ Vegetable ________________________ Fruit ____________________________ Dairy ____________________________ Meat ____________________________ Fat ______________________________ Document Released: 05/16/2005 Document Revised: 11/11/2011 Document Reviewed: 03/27/2009 ExitCare Patient Information 2014 [employer]. DASH Diet The DASH diet stands for "Dietary Approaches to Stop Hypertension." It is a healthy eating plan that has been shown to reduce high blood pressure (hypertension) in as little as 14 days, while also possibly providing other significant health benefits. These other health benefits include reducing the risk of breast cancer after menopause and reducing the risk of type 2 diabetes, heart disease, colon cancer, and stroke. Health benefits also include weight loss and slowing kidney failure in patients with chronic kidney disease.  DIET GUIDELINES  Limit salt (sodium). Your diet should contain less than 1500 mg of sodium daily.  Limit refined or processed carbohydrates. Your diet should include mostly whole grains. Desserts and added sugars should be used sparingly.  Include small amounts of heart-healthy fats. These types of fats include nuts, oils, and tub margarine. Limit saturated and trans fats. These fats have been shown to be harmful in the body. CHOOSING FOODS  The following food groups  are based on a 2000 calorie diet. See your Registered Dietitian for individual calorie needs. Grains and Grain Products (6 to 8 servings daily)  Eat More Often:   Whole-wheat bread, brown rice, whole-grain or wheat pasta, quinoa, popcorn without added fat or salt (air popped).  Eat Less Often: White bread, white pasta, white rice, cornbread. Vegetables (4 to 5 servings daily)  Eat More Often: Fresh, frozen, and canned vegetables. Vegetables may be raw, steamed, roasted, or grilled with a minimal amount of fat.  Eat Less Often/Avoid: Creamed or fried vegetables. Vegetables in a cheese sauce. Fruit (4 to 5 servings daily)  Eat More Often: All fresh, canned (in natural juice), or frozen fruits. Dried fruits without added sugar. One hundred percent fruit juice ( cup [237 mL] daily).  Eat Less Often: Dried fruits with added sugar. Canned fruit in light or heavy syrup. Lean Meats, Fish, and Poultry (2 servings or less daily. One serving is 3 to 4 oz [85-114 g]).  Eat More Often: Ninety percent or leaner ground beef, tenderloin, sirloin. Round cuts of beef, chicken breast, turkey breast. All fish. Grill, bake, or broil your meat. Nothing should be fried.  Eat Less Often/Avoid: Fatty cuts of meat, turkey, or chicken leg, thigh, or wing. Fried cuts of meat or fish. Dairy (2 to 3 servings)  Eat More Often: Low-fat or fat-free milk, low-fat plain or light yogurt, reduced-fat or part-skim cheese.  Eat Less Often/Avoid: Milk (whole, 2%).Whole milk yogurt. Full-fat cheeses. Nuts, Seeds, and Legumes (4 to 5 servings per week)  Eat More Often: All without added salt.  Eat Less Often/Avoid: Salted nuts and seeds, canned beans with added salt. Fats and Sweets (limited)  Eat More Often: Vegetable oils, tub margarines without trans fats, sugar-free gelatin. Mayonnaise and salad dressings.  Eat Less Often/Avoid: Coconut oils, palm oils, butter, stick margarine, cream, half and half, cookies, candy,  pie. FOR MORE INFORMATION The Dash Diet Eating Plan: www.dashdiet.org Document Released: 08/08/2011 Document Revised: 11/11/2011 Document Reviewed: 08/08/2011 ExitCare Patient Information 2014 [employer].  

## 2013-10-22 NOTE — Progress Notes (Signed)
Patient states here for follow up for his DM Patient stated he stopped taking his insulin about 1 week ago Lowered his blood sugars down to the 50's

## 2013-10-22 NOTE — Progress Notes (Signed)
Patient Demographics  Eric Larson, is a 53 y.o. male  SEG:[id number]  VPX:[id number]  DOB - March 16, 1961  CC:  Chief Complaint  Patient presents with  . new patient  . Hospitalization Follow-up       HPI: Eric Larson is a 53 y.o. male here today to establish medical care. He  has history of goiter, hypertension, obesity, recently hospitalized with symptoms of polyuria polydipsia, EMR reviewed her patient had nonketotic hyperosmolar hyperglycemia, patient was treated with hydration and insulin drip, patient symptomatically improved and was discharged on Novolin 70/30 30 units with breakfast and 30 units with dinner, his last hemoglobin A1c was 11.9%, for blood pressure was started on lisinopril 5 mg daily, patient was also hypothyroid and was started Synthroid 50 mcg daily, as per patient he has been taking his thyroid medication and blood pressure medication, but has not been taking his insulin for the last one week since he reported frequent hypoglycemic episodes, patient is trying to modify his diet now as per patient his fasting sugar is usually around h 150-1 70 mg/dL, I have counseled patient that he seen his to take some medication and he is going to try metformin. Patient also reported some blurry vision and would like to see ophthalmologist. Patient has No headache, No chest pain, No abdominal pain - No Nausea, No new weakness tingling or numbness, No Cough - SOB.  No Known Allergies Past Medical History  Diagnosis Date  . Acid reflux   . Gout   . Diabetes mellitus without complication    Current Outpatient Prescriptions on File Prior to Visit  Medication Sig Dispense Refill  . Blood Glucose Monitoring Suppl (BLOOD GLUCOSE METER) kit True result meter. Use as instructed. Check your blood sugar 3 times per day before meals and as needed if you have symptoms of low blood sugar. ICD code 250.0  1 each  0  . glucose blood (TRUETEST TEST) test strip Use as instructed  50  each  12  . Lancets MISC Use as directed to check your blood sugar. ICD code 250.0  100 each  0  . levothyroxine (SYNTHROID, LEVOTHROID) 50 MCG tablet Take 1 tablet (50 mcg total) by mouth daily before breakfast.  30 tablet  3  . lisinopril (PRINIVIL,ZESTRIL) 5 MG tablet Take 1 tablet (5 mg total) by mouth daily.  30 tablet  3   No current facility-administered medications on file prior to visit.   Family History  Problem Relation Age of Onset  . Diabetes Mellitus II Neg Hx   . CAD Neg Hx    History   Social History  . Marital Status: Single    Spouse Name: N/A    Number of Children: N/A  . Years of Education: N/A   Occupational History  . Not on file.   Social History Main Topics  . Smoking status: Never Smoker   . Smokeless tobacco: Never Used  . Alcohol Use: No  . Drug Use: No  . Sexual Activity: Not on file   Other Topics Concern  . Not on file   Social History Narrative  . No narrative on file    Review of Systems: Constitutional: Negative for fever, chills, diaphoresis, activity change, appetite change and fatigue. HENT: Negative for ear pain, nosebleeds, congestion, facial swelling, rhinorrhea, neck pain, neck stiffness and ear discharge.  Eyes: Negative for pain, discharge, redness, itching and +visual disturbance. Respiratory: Negative for cough, choking, chest tightness, shortness of breath, wheezing and stridor.  Cardiovascular: Negative for chest pain, palpitations and leg swelling. Gastrointestinal: Negative for abdominal distention. Genitourinary: Negative for dysuria, urgency, frequency, hematuria, flank pain, decreased urine volume, difficulty urinating and dyspareunia.  Musculoskeletal: Negative for back pain, joint swelling, arthralgia and gait problem. Neurological: Negative for dizziness, tremors, seizures, syncope, facial asymmetry, speech difficulty, weakness, light-headedness, numbness and headaches.  Hematological: Negative for adenopathy. Does  not bruise/bleed easily. Psychiatric/Behavioral: Negative for hallucinations, behavioral problems, confusion, dysphoric mood, decreased concentration and agitation.    Objective:   Filed Vitals:   10/22/13 1036  BP: 144/84  Pulse: 72  Temp: 98.4 F (36.9 C)  Resp: 17    Physical Exam: Constitutional: Patient appears well-developed and well-nourished. No distress. HENT: Normocephalic, atraumatic, External right and left ear normal. Oropharynx is clear and moist. Poor oral hygiene. Eyes: Conjunctivae and EOM are normal. PERRLA, no scleral icterus. Neck: Normal ROM. Neck supple. No JVD. No tracheal deviation. No thyromegaly. Old surgical scar on the neck CVS: RRR, S1/S2 +, no murmurs, no gallops, no carotid bruit.  Pulmonary: Effort and breath sounds normal, no stridor, rhonchi, wheezes, rales.  Abdominal: Soft. BS +, no distension, tenderness, rebound or guarding.  Musculoskeletal: Normal range of motion. No edema and no tenderness.  Neuro: Alert. Normal reflexes, muscle tone coordination. No cranial nerve deficit. Skin: Skin is warm and dry. No rash noted. Not diaphoretic. No erythema. No pallor. Psychiatric: Normal mood and affect. Behavior, judgment, thought content normal.  Lab Results  Component Value Date   WBC 4.4 10/06/2013   HGB 13.3 10/06/2013   HCT 36.8* 10/06/2013   MCV 85.8 10/06/2013   PLT 130* 10/06/2013   Lab Results  Component Value Date   CREATININE 1.26 10/08/2013   BUN 14 10/08/2013   NA 139 10/08/2013   K 4.2 10/08/2013   CL 102 10/08/2013   CO2 26 10/08/2013    Lab Results  Component Value Date   HGBA1C 11.9* 10/06/2013   Lipid Panel     Component Value Date/Time   CHOL 186 10/06/2013 0458   TRIG 377* 10/06/2013 0458   HDL 25* 10/06/2013 0458   CHOLHDL 7.4 10/06/2013 0458   VLDL 75* 10/06/2013 0458   LDLCALC 86 10/06/2013 0458       Assessment and plan:   1. Diabetes mellitus Patient is stopped taking insulin, I have started patient on metformin, advise to monitor his  blood sugar at home Will repeat hemoglobin A1c on the next visit. - metFORMIN (GLUCOPHAGE) 500 MG tablet; Take 1 tablet (500 mg total) by mouth 2 (two) times daily with a meal.  Dispense: 180 tablet; Refill: 3 - COMPLETE METABOLIC PANEL WITH GFR; Future - Lipid panel; Future - Ambulatory referral to Ophthalmology  2. Unspecified hypothyroidism Continue with levothyroxine 50 mcg daily number we'll repeat TSH level prior to the next visit  - TSH; Future  3. CKD (chronic kidney disease) Repeat blood chemistry  4. Blurry vision  - Ambulatory referral to Ophthalmology  5. Screening Baseline blood work - Vit D  25 hydroxy (rtn osteoporosis monitoring); Future - CBC with Differential; Future  Return in about 2 months (around 12/20/2013).   Lorayne Marek, MD

## 2013-12-13 ENCOUNTER — Other Ambulatory Visit: Payer: Self-pay

## 2013-12-20 ENCOUNTER — Ambulatory Visit: Payer: Self-pay | Admitting: Internal Medicine

## 2014-02-17 ENCOUNTER — Other Ambulatory Visit: Payer: Self-pay | Admitting: Internal Medicine

## 2014-02-17 ENCOUNTER — Other Ambulatory Visit: Payer: Self-pay

## 2014-02-17 MED ORDER — LISINOPRIL 5 MG PO TABS: 5.0000 mg | ORAL_TABLET | Freq: Every day | ORAL | Status: DC

## 2014-02-17 MED ORDER — LEVOTHYROXINE SODIUM 50 MCG PO TABS: 50.0000 ug | ORAL_TABLET | Freq: Every day | ORAL | Status: DC

## 2015-03-14 ENCOUNTER — Telehealth: Payer: Self-pay | Admitting: Internal Medicine

## 2015-03-14 NOTE — Telephone Encounter (Signed)
Please schedule this patient an appt. He was last seen 10/07/2013

## 2015-03-14 NOTE — Telephone Encounter (Signed)
Patient came into office requesting medication refill on metFORMIN (GLUCOPHAGE) 500 MG tablet and glucose blood (TRUETEST TEST) test strip. Patient uses [facility] pharmacy

## 2015-03-16 ENCOUNTER — Other Ambulatory Visit: Payer: Self-pay

## 2015-03-16 DIAGNOSIS — E139 Other specified diabetes mellitus without complications: Secondary | ICD-10-CM

## 2015-03-16 MED ORDER — METFORMIN HCL 500 MG PO TABS
500.0000 mg | ORAL_TABLET | Freq: Two times a day (BID) | ORAL | Status: DC
Start: 2015-03-16 — End: 2015-08-24

## 2015-03-21 ENCOUNTER — Ambulatory Visit: Payer: Self-pay | Admitting: Internal Medicine

## 2015-08-24 ENCOUNTER — Encounter (HOSPITAL_COMMUNITY): Payer: Self-pay | Admitting: *Deleted

## 2015-08-24 ENCOUNTER — Emergency Department (HOSPITAL_COMMUNITY)
Admission: EM | Admit: 2015-08-24 | Discharge: 2015-08-24 | Disposition: A | Discharge status: Home / Self Care | Payer: Self-pay | Attending: Emergency Medicine | Admitting: Emergency Medicine

## 2015-08-24 DIAGNOSIS — E1165 Type 2 diabetes mellitus with hyperglycemia: Secondary | ICD-10-CM | POA: Insufficient documentation

## 2015-08-24 DIAGNOSIS — Z8739 Personal history of other diseases of the musculoskeletal system and connective tissue: Secondary | ICD-10-CM | POA: Insufficient documentation

## 2015-08-24 DIAGNOSIS — R739 Hyperglycemia, unspecified: Secondary | ICD-10-CM

## 2015-08-24 DIAGNOSIS — Z8719 Personal history of other diseases of the digestive system: Secondary | ICD-10-CM | POA: Insufficient documentation

## 2015-08-24 DIAGNOSIS — E139 Other specified diabetes mellitus without complications: Secondary | ICD-10-CM

## 2015-08-24 DIAGNOSIS — Z79899 Other long term (current) drug therapy: Secondary | ICD-10-CM | POA: Insufficient documentation

## 2015-08-24 LAB — URINE MICROSCOPIC-ADD ON
BACTERIA UA: NONE SEEN
Squamous Epithelial / LPF: NONE SEEN

## 2015-08-24 LAB — BASIC METABOLIC PANEL
ANION GAP: 7 (ref 5–15)
BUN: 17 mg/dL (ref 6–20)
CO2: 26 mmol/L (ref 22–32)
Calcium: 9.5 mg/dL (ref 8.9–10.3)
Chloride: 99 mmol/L — ABNORMAL LOW (ref 101–111)
Creatinine, Ser: 1.21 mg/dL (ref 0.61–1.24)
GFR calc non Af Amer: >60 mL/min (ref >60)
Glucose, Bld: 522 mg/dL — ABNORMAL HIGH (ref 65–99)
Potassium: 4.4 mmol/L (ref 3.5–5.1)
Sodium: 132 mmol/L — ABNORMAL LOW (ref 135–145)

## 2015-08-24 LAB — CBG MONITORING, ED
GLUCOSE-CAPILLARY: 332 mg/dL — AB (ref 65–99)
Glucose-Capillary: 479 mg/dL — ABNORMAL HIGH (ref 65–99)

## 2015-08-24 LAB — URINALYSIS, ROUTINE W REFLEX MICROSCOPIC
Bilirubin Urine: NEGATIVE
HGB URINE DIPSTICK: NEGATIVE
Ketones, ur: 15 mg/dL — AB
Leukocytes, UA: NEGATIVE
Nitrite: NEGATIVE
PH: 5 (ref 5.0–8.0)
Protein, ur: NEGATIVE mg/dL
SPECIFIC GRAVITY, URINE: 1.037 — AB (ref 1.005–1.030)

## 2015-08-24 LAB — CBC
HEMATOCRIT: 38.5 % — AB (ref 39.0–52.0)
HEMOGLOBIN: 13.9 g/dL (ref 13.0–17.0)
MCH: 30.5 pg (ref 26.0–34.0)
MCHC: 36.1 g/dL — ABNORMAL HIGH (ref 30.0–36.0)
MCV: 84.4 fL (ref 78.0–100.0)
Platelets: 125 10*3/uL — ABNORMAL LOW (ref 150–400)
RBC: 4.56 MIL/uL (ref 4.22–5.81)
RDW: 12.9 % (ref 11.5–15.5)
WBC: 4.7 10*3/uL (ref 4.0–10.5)

## 2015-08-24 MED ORDER — SODIUM CHLORIDE 0.9 % IV BOLUS (SEPSIS)
2000.0000 mL | Freq: Once | INTRAVENOUS | Status: AC
  Administered 2015-08-24: 2000 mL via INTRAVENOUS

## 2015-08-24 MED ORDER — METFORMIN HCL 500 MG PO TABS: 500.0000 mg | ORAL_TABLET | Freq: Two times a day (BID) | ORAL | Status: DC

## 2015-08-24 NOTE — ED Notes (Signed)
The pt wants his glucose checked he has had weakness and vision changes for the past 2 weeks

## 2015-08-24 NOTE — ED Provider Notes (Signed)
CSN: [id number]     Arrival date & time 08/24/15  1831 History   First MD Initiated Contact with Patient 08/24/15 1935     Chief Complaint  Patient presents with  . Weakness     (Consider location/radiation/quality/duration/timing/severity/associated sxs/prior Treatment) HPI  54 year old male with a history of diabetes presents with hyperglycemia. Patient states he knows his glucose is high because it is been in the 250s at home. He has not taken his metformin and over 1 week due to not having it refilled. Patient also has not been taking his blood pressure medicine. Patient is feeling overall fatigued but denies focal weakness. Intermittently has been having blurry vision with far distances. Is also urinating a lot. Patient states all of the symptoms occur whenever his sugar gets high. No double vision, headache, or focal weakness/numbness.  Past Medical History  Diagnosis Date  . Acid reflux   . Gout   . Diabetes mellitus without complication [facility])    Past Surgical History  Procedure Laterality Date  . Thyroid surgery     Family History  Problem Relation Age of Onset  . Diabetes Mellitus II Neg Hx   . CAD Neg Hx    Social History  Substance Use Topics  . Smoking status: Never Smoker   . Smokeless tobacco: Never Used  . Alcohol Use: No    Review of Systems  Constitutional: Positive for fatigue. Negative for fever.  Eyes: Positive for visual disturbance.  Endocrine: Positive for polydipsia and polyuria.  Neurological: Negative for weakness, numbness and headaches.  All other systems reviewed and are negative.     Allergies  Review of patient's allergies indicates no known allergies.  Home Medications   Prior to Admission medications   Medication Sig Start Date End Date Taking? Authorizing Provider  levothyroxine (SYNTHROID, LEVOTHROID) 50 MCG tablet Take 1 tablet (50 mcg total) by mouth daily before breakfast. 02/17/14  Yes Olugbemiga E Hyman Hopes, MD  lisinopril  (PRINIVIL,ZESTRIL) 5 MG tablet Take 1 tablet (5 mg total) by mouth daily. 02/17/14  Yes Quentin Angst, MD  metFORMIN (GLUCOPHAGE) 500 MG tablet Take 1 tablet (500 mg total) by mouth 2 (two) times daily with a meal. 03/16/15  Yes Deepak Advani, MD   BP 162/95 mmHg  Pulse 65  Temp(Src) 98.7 F (37.1 C) (Oral)  Resp 20  SpO2 98% Physical Exam  Constitutional: He is oriented to person, place, and time. He appears well-developed and well-nourished.  HENT:  Head: Normocephalic and atraumatic.  Right Ear: External ear normal.  Left Ear: External ear normal.  Nose: Nose normal.  Eyes: Right eye exhibits no discharge. Left eye exhibits no discharge.  Neck: Neck supple.  Cardiovascular: Normal rate, regular rhythm, normal heart sounds and intact distal pulses.   Pulmonary/Chest: Effort normal and breath sounds normal.  Abdominal: Soft. He exhibits no distension. There is no tenderness.  Musculoskeletal: He exhibits no edema.  Neurological: He is alert and oriented to person, place, and time.  Skin: Skin is warm and dry.  Nursing note and vitals reviewed.   ED Course  Procedures (including critical care time) Labs Review Labs Reviewed  BASIC METABOLIC PANEL - Abnormal; Notable for the following:    Sodium 132 (*)    Chloride 99 (*)    Glucose, Bld 522 (*)    All other components within normal limits  CBC - Abnormal; Notable for the following:    HCT 38.5 (*)    MCHC 36.1 (*)    Platelets  125 (*)    All other components within normal limits  URINALYSIS, ROUTINE W REFLEX MICROSCOPIC (NOT AT [facility]) - Abnormal; Notable for the following:    Specific Gravity, Urine 1.037 (*)    Glucose, UA >1000 (*)    Ketones, ur 15 (*)    All other components within normal limits  CBG MONITORING, ED - Abnormal; Notable for the following:    Glucose-Capillary 479 (*)    All other components within normal limits  URINE MICROSCOPIC-ADD ON    Imaging Review No results found. I have personally  reviewed and evaluated these images and lab results as part of my medical decision-making.   EKG Interpretation None      MDM   Final diagnoses:  Hyperglycemia    Patient symptoms are consistent with his significant hyperglycemia. No acidosis. He is much better after 2 L of IV fluids. He complains of intermittent blurry vision which he has had with hyperglycemia in the past but none now. Exam unremarkable. Will refill his metformin, he tells me now that he asked has enough lisinopril at home. Stressed the importance of following up closely with his PCP.    Pricilla Loveless, MD 08/25/15 [phone]

## 2015-08-25 ENCOUNTER — Other Ambulatory Visit: Payer: Self-pay | Admitting: Pharmacist

## 2015-08-25 ENCOUNTER — Telehealth: Payer: Self-pay | Admitting: Internal Medicine

## 2015-08-25 ENCOUNTER — Other Ambulatory Visit: Payer: Self-pay | Admitting: *Deleted

## 2015-08-25 DIAGNOSIS — E1100 Type 2 diabetes mellitus with hyperosmolarity without nonketotic hyperglycemic-hyperosmolar coma (NKHHC): Secondary | ICD-10-CM

## 2015-08-25 MED ORDER — TRUE METRIX METER W/DEVICE KIT: 1.0000 | PACK | Freq: Three times a day (TID) | Status: AC

## 2015-08-25 MED ORDER — GLUCOSE BLOOD VI STRP: ORAL_STRIP | Status: DC

## 2015-08-25 NOTE — Telephone Encounter (Signed)
Pt needs test strips. Patient did not know that name of the test strips he needs and I did not see them on his medication list. Please follow up with pt.

## 2015-08-25 NOTE — Telephone Encounter (Signed)
Patient verified DOB Patient verified using a true metrix meter. Medical Assistant refilled patients true Metrix test strips. Patient stated he would be to the office before 1pm today.

## 2015-09-22 ENCOUNTER — Telehealth: Payer: Self-pay | Admitting: Internal Medicine

## 2015-09-22 DIAGNOSIS — E139 Other specified diabetes mellitus without complications: Secondary | ICD-10-CM

## 2015-09-22 MED ORDER — METFORMIN HCL 500 MG PO TABS: 500.0000 mg | ORAL_TABLET | Freq: Two times a day (BID) | ORAL | Status: DC

## 2015-09-22 NOTE — Telephone Encounter (Signed)
Pt. Called requesting a med refill on Metformin. Please f/u with pt. °

## 2015-10-03 ENCOUNTER — Encounter: Payer: Self-pay | Admitting: Internal Medicine

## 2015-10-03 ENCOUNTER — Ambulatory Visit: Payer: Self-pay | Attending: Internal Medicine | Admitting: Internal Medicine

## 2015-10-03 VITALS — BP 138/94 | HR 74 | Temp 98.0°F | Resp 16 | Ht 72.0 in | Wt 193.0 lb

## 2015-10-03 DIAGNOSIS — E1165 Type 2 diabetes mellitus with hyperglycemia: Secondary | ICD-10-CM | POA: Insufficient documentation

## 2015-10-03 DIAGNOSIS — Z7984 Long term (current) use of oral hypoglycemic drugs: Secondary | ICD-10-CM | POA: Insufficient documentation

## 2015-10-03 DIAGNOSIS — Z79899 Other long term (current) drug therapy: Secondary | ICD-10-CM | POA: Insufficient documentation

## 2015-10-03 DIAGNOSIS — K219 Gastro-esophageal reflux disease without esophagitis: Secondary | ICD-10-CM | POA: Insufficient documentation

## 2015-10-03 DIAGNOSIS — Z2821 Immunization not carried out because of patient refusal: Secondary | ICD-10-CM

## 2015-10-03 DIAGNOSIS — E119 Type 2 diabetes mellitus without complications: Secondary | ICD-10-CM | POA: Insufficient documentation

## 2015-10-03 DIAGNOSIS — E039 Hypothyroidism, unspecified: Secondary | ICD-10-CM | POA: Insufficient documentation

## 2015-10-03 DIAGNOSIS — Z9114 Patient's other noncompliance with medication regimen: Secondary | ICD-10-CM | POA: Insufficient documentation

## 2015-10-03 DIAGNOSIS — M109 Gout, unspecified: Secondary | ICD-10-CM | POA: Insufficient documentation

## 2015-10-03 DIAGNOSIS — B351 Tinea unguium: Secondary | ICD-10-CM | POA: Insufficient documentation

## 2015-10-03 LAB — COMPLETE METABOLIC PANEL WITH GFR
ALBUMIN: 3.6 g/dL (ref 3.6–5.1)
ALK PHOS: 55 U/L (ref 40–115)
ALT: 21 U/L (ref 9–46)
AST: 15 U/L (ref 10–35)
BILIRUBIN TOTAL: 1.1 mg/dL (ref 0.2–1.2)
BUN: 16 mg/dL (ref 7–25)
CALCIUM: 9.4 mg/dL (ref 8.6–10.3)
CO2: 28 mmol/L (ref 20–31)
Chloride: 102 mmol/L (ref 98–110)
Creat: 1.12 mg/dL (ref 0.70–1.33)
GFR, EST AFRICAN AMERICAN: 85 mL/min (ref >=60)
GFR, EST NON AFRICAN AMERICAN: 74 mL/min (ref >=60)
GLUCOSE: 148 mg/dL — AB (ref 65–99)
POTASSIUM: 4.7 mmol/L (ref 3.5–5.3)
SODIUM: 136 mmol/L (ref 135–146)
TOTAL PROTEIN: 7.8 g/dL (ref 6.1–8.1)

## 2015-10-03 LAB — POCT GLYCOSYLATED HEMOGLOBIN (HGB A1C): Hemoglobin A1C: 10.4

## 2015-10-03 LAB — GLUCOSE, POCT (MANUAL RESULT ENTRY): POC Glucose: 156 mg/dl — AB (ref 70–99)

## 2015-10-03 MED ORDER — LEVOTHYROXINE SODIUM 50 MCG PO TABS: 50.0000 ug | ORAL_TABLET | Freq: Every day | ORAL | Status: DC

## 2015-10-03 MED ORDER — LISINOPRIL 5 MG PO TABS: 5.0000 mg | ORAL_TABLET | Freq: Every day | ORAL | Status: DC

## 2015-10-03 MED ORDER — METFORMIN HCL 500 MG PO TABS: 500.0000 mg | ORAL_TABLET | Freq: Two times a day (BID) | ORAL | Status: DC

## 2015-10-03 MED FILL — ?LEVOTHYROXINE 50 MCG TABLE: 50 | 30 days supply | Qty: 30 | Fill #0

## 2015-10-03 MED FILL — LISINOPRIL 5 MG TABLET: 5 | 30 days supply | Qty: 30 | Fill #0

## 2015-10-03 MED FILL — metFORMIN HCL 500 MG TABS: 500 | 30 days supply | Qty: 60 | Fill #0

## 2015-10-03 NOTE — Progress Notes (Signed)
Patient ID: Eric Larson, male   DOB: 1961-04-23, 55 y.o.   MRN: [id number]  CC: diabetes, thyroid  HPI: Eric Larson is a 55 y.o. male here today for a follow up visit.  Patient has past medical history of thyroid disease and diabetes. Patient has not been seen here in the past two years and has not been going to any facility for care. He states that he only started back taking the Metformin last month but has been attempting to control it with diet. Patient has not been on Synthroid in several months as well. He reports improving blurred vision. Denies neuropathy, polyuria, polydipsia.  No Known Allergies Past Medical History  Diagnosis Date  . Acid reflux   . Gout   . Diabetes mellitus without complication ([place])    Current Outpatient Prescriptions on File Prior to Visit  Medication Sig Dispense Refill  . levothyroxine (SYNTHROID, LEVOTHROID) 50 MCG tablet Take 1 tablet (50 mcg total) by mouth daily before breakfast. 30 tablet 3  . lisinopril (PRINIVIL,ZESTRIL) 5 MG tablet Take 1 tablet (5 mg total) by mouth daily. 30 tablet 3  . metFORMIN (GLUCOPHAGE) 500 MG tablet Take 1 tablet (500 mg total) by mouth 2 (two) times daily with a meal. 60 tablet 0  . Blood Glucose Monitoring Suppl (TRUE METRIX METER) W/DEVICE KIT 1 each by Does not apply route 3 (three) times daily. 1 kit 0  . glucose blood (TRUE METRIX BLOOD GLUCOSE TEST) test strip Use as instructed 100 each 12   No current facility-administered medications on file prior to visit.   Family History  Problem Relation Age of Onset  . Diabetes Mellitus II Neg Hx   . CAD Neg Hx    Social History   Social History  . Marital Status: Single    Spouse Name: N/A  . Number of Children: N/A  . Years of Education: N/A   Occupational History  . Not on file.   Social History Main Topics  . Smoking status: Never Smoker   . Smokeless tobacco: Never Used  . Alcohol Use: No  . Drug Use: No  . Sexual Activity: Not on file   Other  Topics Concern  . Not on file   Social History Narrative    Review of Systems: Other than what is stated in HPI, all other systems are negative.   Objective:   Filed Vitals:   10/03/15 1606  BP: 138/94  Pulse: 74  Temp: 98 F (36.7 C)  Resp: 16    Physical Exam  Constitutional: He is oriented to person, place, and time.  Cardiovascular: Normal rate, regular rhythm and normal heart sounds.   Pulses:      Dorsalis pedis pulses are 2+ on the right side, and 2+ on the left side.       Posterior tibial pulses are 2+ on the right side, and 2+ on the left side.  Pulmonary/Chest: Effort normal and breath sounds normal.  Musculoskeletal: He exhibits no edema.  Feet:  Right Foot:  Protective Sensation: 10 sites tested.10 sites sensed. Skin Integrity: Negative for skin breakdown.  Left Foot:  Protective Sensation: 10 sites tested. 10 sites sensed. Skin Integrity: Negative for skin breakdown.  Neurological: He is alert and oriented to person, place, and time.  Skin:  Onychomycosis on BLE.      Lab Results  Component Value Date   WBC 4.7 08/24/2015   HGB 13.9 08/24/2015   HCT 38.5* 08/24/2015   MCV 84.4 08/24/2015  PLT 125* 08/24/2015   Lab Results  Component Value Date   CREATININE 1.21 08/24/2015   BUN 17 08/24/2015   NA 132* 08/24/2015   K 4.4 08/24/2015   CL 99* 08/24/2015   CO2 26 08/24/2015    Lab Results  Component Value Date   HGBA1C 10.40 10/03/2015   Lipid Panel     Component Value Date/Time   CHOL 186 10/06/2013 0458   TRIG 377* 10/06/2013 0458   HDL 25* 10/06/2013 0458   CHOLHDL 7.4 10/06/2013 0458   VLDL 75* 10/06/2013 0458   LDLCALC 86 10/06/2013 0458       Assessment and plan:   Eric Larson was seen today for new patient (initial visit).  Diagnoses and all orders for this visit:  Type 2 diabetes mellitus without complication, without long-term current use of insulin (HCC) -     Glucose (CBG) -     HgB A1c -     lisinopril  (PRINIVIL,ZESTRIL) 5 MG tablet; Take 1 tablet (5 mg total) by mouth daily. For BP/kidney protection -     metFORMIN (GLUCOPHAGE) 500 MG tablet; Take 1 tablet (500 mg total) by mouth 2 (two) times daily with a meal. -     COMPLETE METABOLIC PANEL WITH GFR -     Microalbumin, urine Uncontrolled diabetes due to medication non-compliance. I have stressed the long term complications of diabetes with patient. Explained that he wants strict control to preserve kidney function.   Hypothyroidism, unspecified hypothyroidism type -    Refilled levothyroxine (SYNTHROID, LEVOTHROID) 50 MCG tablet; Take 1 tablet (50 mcg total) by mouth daily before breakfast. -     TSH; Future I will not check his TSH today since he has not been on medication. I will start him back on his last dose and recheck him 2 months.   Onychomycosis Patient refused treatement today when I explained that he will need to come back in 1 month to check liver function. He states that he will wait until he is seen by podiatry.   Refused influenza vaccine Explained that annual influenza is recommended per CDC guidelines and is highly suggested to anyone who has has CHF, COPD, DM or immunocompromised. Benefits of influenza described in detail.      Return in about 2 months (around 12/01/2015) for Lab Visit and 3 mo PCP .   Lance Bosch, [place] [facility] [phone] 10/03/2015, 4:30 PM

## 2015-10-03 NOTE — Patient Instructions (Signed)
Diabetes Mellitus and Food It is important for you to manage your blood sugar (glucose) level. Your blood glucose level can be greatly affected by what you eat. Eating healthier foods in the appropriate amounts throughout the day at about the same time each day will help you control your blood glucose level. It can also help slow or prevent worsening of your diabetes mellitus. Healthy eating may even help you improve the level of your blood pressure and reach or maintain a healthy weight.  General recommendations for healthful eating and cooking habits include:  Eating meals and snacks regularly. Avoid going long periods of time without eating to lose weight.  Eating a diet that consists mainly of plant-based foods, such as fruits, vegetables, nuts, legumes, and whole grains.  Using low-heat cooking methods, such as baking, instead of high-heat cooking methods, such as deep frying. Work with your dietitian to make sure you understand how to use the Nutrition Facts information on food labels. HOW CAN FOOD AFFECT ME? Carbohydrates Carbohydrates affect your blood glucose level more than any other type of food. Your dietitian will help you determine how many carbohydrates to eat at each meal and teach you how to count carbohydrates. Counting carbohydrates is important to keep your blood glucose at a healthy level, especially if you are using insulin or taking certain medicines for diabetes mellitus. Alcohol Alcohol can cause sudden decreases in blood glucose (hypoglycemia), especially if you use insulin or take certain medicines for diabetes mellitus. Hypoglycemia can be a life-threatening condition. Symptoms of hypoglycemia (sleepiness, dizziness, and disorientation) are similar to symptoms of having too much alcohol.  If your health care provider has given you approval to drink alcohol, do so in moderation and use the following guidelines:  Women should not have more than one drink per day, and men  should not have more than two drinks per day. One drink is equal to:  12 oz of beer.  5 oz of wine.  1 oz of hard liquor.  Do not drink on an empty stomach.  Keep yourself hydrated. Have water, diet soda, or unsweetened iced tea.  Regular soda, juice, and other mixers might contain a lot of carbohydrates and should be counted. WHAT FOODS ARE NOT RECOMMENDED? As you make food choices, it is important to remember that all foods are not the same. Some foods have fewer nutrients per serving than other foods, even though they might have the same number of calories or carbohydrates. It is difficult to get your body what it needs when you eat foods with fewer nutrients. Examples of foods that you should avoid that are high in calories and carbohydrates but low in nutrients include:  Trans fats (most processed foods list trans fats on the Nutrition Facts label).  Regular soda.  Juice.  Candy.  Sweets, such as cake, pie, doughnuts, and cookies.  Fried foods. WHAT FOODS CAN I EAT? Eat nutrient-rich foods, which will nourish your body and keep you healthy. The food you should eat also will depend on several factors, including:  The calories you need.  The medicines you take.  Your weight.  Your blood glucose level.  Your blood pressure level.  Your cholesterol level. You should eat a variety of foods, including:  Protein.  Lean cuts of meat.  Proteins low in saturated fats, such as fish, egg whites, and beans. Avoid processed meats.  Fruits and vegetables.  Fruits and vegetables that may help control blood glucose levels, such as apples, mangoes, and   yams.  Dairy products.  Choose fat-free or low-fat dairy products, such as milk, yogurt, and cheese.  Grains, bread, pasta, and rice.  Choose whole grain products, such as multigrain bread, whole oats, and brown rice. These foods may help control blood pressure.  Fats.  Foods containing healthful fats, such as nuts,  avocado, olive oil, canola oil, and fish. DOES EVERYONE WITH DIABETES MELLITUS HAVE THE SAME MEAL PLAN? Because every person with diabetes mellitus is different, there is not one meal plan that works for everyone. It is very important that you meet with a dietitian who will help you create a meal plan that is just right for you.   This information is not intended to replace advice given to you by your health care provider. Make sure you discuss any questions you have with your health care provider.   Document Released: 05/16/2005 Document Revised: 09/09/2014 Document Reviewed: 07/16/2013 Elsevier Interactive Patient Education 2016 Elsevier Inc.  

## 2015-10-03 NOTE — Progress Notes (Signed)
Patient here for follow up on his diabetes htn and thyroid disease Patient also in need of medication refills

## 2015-10-04 LAB — MICROALBUMIN, URINE: MICROALB UR: 3.9 mg/dL

## 2015-10-06 ENCOUNTER — Telehealth: Payer: Self-pay

## 2015-10-06 NOTE — Telephone Encounter (Signed)
-----   Message from Ambrose Finland, NP sent at 10/06/2015  1:10 PM EST ----- Labs normal

## 2015-10-06 NOTE — Telephone Encounter (Signed)
Called home number listed in chart Person who answered told me i had the wrong number

## 2015-10-09 ENCOUNTER — Ambulatory Visit: Payer: Self-pay

## 2015-11-06 MED FILL — LISINOPRIL 5 MG TABLET: 5 | 30 days supply | Qty: 30 | Fill #1

## 2015-11-06 MED FILL — metFORMIN HCL 500 MG TABS: 500 | 30 days supply | Qty: 60 | Fill #1

## 2015-11-06 MED FILL — LEVOTHYROXINE 50 MCG TABLET: 50 | 30 days supply | Qty: 30 | Fill #1

## 2015-11-08 ENCOUNTER — Ambulatory Visit: Payer: Self-pay | Attending: Internal Medicine | Admitting: Internal Medicine

## 2015-11-08 ENCOUNTER — Encounter: Payer: Self-pay | Admitting: Internal Medicine

## 2015-11-08 VITALS — BP 135/92 | HR 72 | Temp 97.9°F | Resp 15 | Ht 72.0 in | Wt 199.0 lb

## 2015-11-08 DIAGNOSIS — Z7984 Long term (current) use of oral hypoglycemic drugs: Secondary | ICD-10-CM | POA: Insufficient documentation

## 2015-11-08 DIAGNOSIS — E119 Type 2 diabetes mellitus without complications: Secondary | ICD-10-CM | POA: Insufficient documentation

## 2015-11-08 DIAGNOSIS — Z79899 Other long term (current) drug therapy: Secondary | ICD-10-CM | POA: Insufficient documentation

## 2015-11-08 DIAGNOSIS — E1165 Type 2 diabetes mellitus with hyperglycemia: Secondary | ICD-10-CM

## 2015-11-08 LAB — GLUCOSE, POCT (MANUAL RESULT ENTRY): POC Glucose: 145 mg/dl — AB (ref 70–99)

## 2015-11-08 NOTE — Progress Notes (Signed)
Here to follow up on his DM Did not bring a log or meter but states the lowest his sugar has been is 96 and the highest 190 Has refills on meds

## 2015-11-08 NOTE — Progress Notes (Signed)
Patient ID: Eric Larson, male   DOB: 22-Jan-1961, 55 y.o.   MRN: [id number] SUBJECTIVE: 55 y.o. male for follow up of diabetes. Diabetic Review of Systems - medication compliance: compliant most of the time, diabetic diet compliance: compliant most of the time, home glucose monitoring: is performed regularly, fasting values range 96-190, most days 120-140, further diabetic ROS: no polyuria or polydipsia, no chest pain, dyspnea or TIA's, no numbness, tingling or pain in extremities, no unusual visual symptoms, no hypoglycemia.  Patient did not bring sugar log with him today.   Current Outpatient Prescriptions  Medication Sig Dispense Refill  . Blood Glucose Monitoring Suppl (TRUE METRIX METER) W/DEVICE KIT 1 each by Does not apply route 3 (three) times daily. 1 kit 0  . glucose blood (TRUE METRIX BLOOD GLUCOSE TEST) test strip Use as instructed 100 each 12  . levothyroxine (SYNTHROID, LEVOTHROID) 50 MCG tablet Take 1 tablet (50 mcg total) by mouth daily before breakfast. 30 tablet 2  . lisinopril (PRINIVIL,ZESTRIL) 5 MG tablet Take 1 tablet (5 mg total) by mouth daily. For BP/kidney protection 30 tablet 3  . metFORMIN (GLUCOPHAGE) 500 MG tablet Take 1 tablet (500 mg total) by mouth 2 (two) times daily with a meal. 60 tablet 3   No current facility-administered medications for this visit.    OBJECTIVE: Appearance: alert, well appearing, and in no distress, oriented to person, place, and time and normal appearing weight. BP 135/92 mmHg  Pulse 72  Temp(Src) 97.9 F (36.6 C)  Resp 15  Ht 6' (1.829 m)  Wt 199 lb (90.266 kg)  BMI 26.98 kg/m2  SpO2 96%  Exam: heart sounds normal rate, regular rhythm, normal S1, S2, no murmurs, rubs, clicks or gallops, no JVD, feet: warm, good capillary refill, no trophic changes or ulcerative lesions, normal DP and PT pulses, normal monofilament exam, normal sensory exam and onychomycosis  ASSESSMENT: Diabetes Mellitus: numbers are improving but he needs to  bring log on next visit. I am unable to make changes to regimen today since he did not bring log book. He will return in May for a repeat A1C and diabetes follow up. If at that time he is still above 7.5% I will increase his Metformin dose.    PLAN: See orders for this visit as documented in the electronic medical record. Issues reviewed with him: low cholesterol diet, weight control and daily exercise discussed, foot care discussed and Podiatry visits discussed, annual eye examinations at Ophthalmology discussed and long term diabetic complications discussed.  Return for Beginning of May PCP-DM f/u.  Lance Bosch, NP 11/08/2015 2:50 PM

## 2015-12-06 MED FILL — ?LEVOTHYROXINE 50 MCG TABLE: 50 | 30 days supply | Qty: 30 | Fill #2

## 2015-12-06 MED FILL — ?METFORMIN HCL 500MG TABLET: 500 | 30 days supply | Qty: 60 | Fill #2

## 2015-12-06 MED FILL — LISINOPRIL 5 MG TABLET: 5 | 30 days supply | Qty: 30 | Fill #2

## 2016-01-08 ENCOUNTER — Other Ambulatory Visit: Payer: Self-pay | Admitting: Internal Medicine

## 2016-01-08 MED FILL — metFORMIN HCL 500 MG TABS: 500 | 30 days supply | Qty: 60 | Fill #3

## 2016-01-08 MED FILL — LISINOPRIL 5 MG TABLET: 5 | 30 days supply | Qty: 30 | Fill #3

## 2016-01-08 MED FILL — ?LEVOTHYROXINE 50 MCG TABLE: 50 | 30 days supply | Qty: 30 | Fill #0

## 2016-03-15 ENCOUNTER — Telehealth: Payer: Self-pay | Admitting: Internal Medicine

## 2016-03-15 DIAGNOSIS — E119 Type 2 diabetes mellitus without complications: Secondary | ICD-10-CM

## 2016-03-15 MED ORDER — LEVOTHYROXINE SODIUM 50 MCG PO TABS: 50.0000 ug | ORAL_TABLET | Freq: Every day | ORAL | Status: DC

## 2016-03-15 MED ORDER — LISINOPRIL 5 MG PO TABS: 5.0000 mg | ORAL_TABLET | Freq: Every day | ORAL | Status: DC

## 2016-03-15 MED ORDER — METFORMIN HCL 500 MG PO TABS: 500.0000 mg | ORAL_TABLET | Freq: Two times a day (BID) | ORAL | Status: DC

## 2016-03-15 MED FILL — ?METFORMIN HCL 500MG TABLET: 500 | 30 days supply | Qty: 60 | Fill #0

## 2016-03-15 MED FILL — ?LEVOTHYROXINE 50 MCG TABLE: 50 | 30 days supply | Qty: 30 | Fill #0

## 2016-03-15 MED FILL — ?LISINOPRIL 5 MG TABLET: 5 | 30 days supply | Qty: 30 | Fill #0

## 2016-03-15 NOTE — Telephone Encounter (Signed)
Pt. Came into facility requesting a refill on the following medications:  levothyroxine (SYNTHROID, LEVOTHROID) 50 MCG tablet lisinopril (PRINIVIL,ZESTRIL) 5 MG tablet metFORMIN (GLUCOPHAGE) 500 MG tablet  Pt. States he has not had he's medication in 3 weeks. Please f/u

## 2016-03-15 NOTE — Telephone Encounter (Signed)
Refilled requested medications. Patient needs office visit for further refills.

## 2016-08-28 ENCOUNTER — Telehealth: Payer: Self-pay | Admitting: General Practice

## 2016-08-28 DIAGNOSIS — E119 Type 2 diabetes mellitus without complications: Secondary | ICD-10-CM

## 2016-08-28 MED ORDER — LEVOTHYROXINE SODIUM 50 MCG PO TABS: 50.0000 ug | ORAL_TABLET | Freq: Every day | ORAL | 0 refills | Status: DC

## 2016-08-28 MED ORDER — METFORMIN HCL 500 MG PO TABS: 500.0000 mg | ORAL_TABLET | Freq: Two times a day (BID) | ORAL | 0 refills | Status: DC

## 2016-08-28 MED ORDER — LISINOPRIL 5 MG PO TABS: 5.0000 mg | ORAL_TABLET | Freq: Every day | ORAL | 0 refills | Status: DC

## 2016-08-28 MED FILL — ?METFORMIN HCL 500MG TABLET: 500 | 30 days supply | Qty: 60 | Fill #0

## 2016-08-28 MED FILL — LEVOTHYROXINE 50 MCG TABLET: 50 | 30 days supply | Qty: 30 | Fill #0

## 2016-08-28 NOTE — Telephone Encounter (Signed)
Requested medications refilled - must keep office visit for any further refills.

## 2016-08-28 NOTE — Telephone Encounter (Signed)
Relative of patient called requesting medication, levothyroxine (SYNTHROID, LEVOTHROID) 50 MCG tablet lisinopril (PRINIVIL,ZESTRIL) 5 MG tablet metFORMIN (GLUCOPHAGE) 500 MG tablet., Patient aware he needs to make an appt. Patient has appt on1/3.

## 2016-09-04 ENCOUNTER — Encounter: Payer: Self-pay | Admitting: Family Medicine

## 2016-09-04 ENCOUNTER — Ambulatory Visit: Payer: Self-pay | Attending: Family Medicine | Admitting: Family Medicine

## 2016-09-04 VITALS — BP 140/82 | HR 64 | Ht 72.0 in | Wt 211.4 lb

## 2016-09-04 DIAGNOSIS — E1100 Type 2 diabetes mellitus with hyperosmolarity without nonketotic hyperglycemic-hyperosmolar coma (NKHHC): Secondary | ICD-10-CM

## 2016-09-04 DIAGNOSIS — Z7984 Long term (current) use of oral hypoglycemic drugs: Secondary | ICD-10-CM | POA: Insufficient documentation

## 2016-09-04 DIAGNOSIS — E1101 Type 2 diabetes mellitus with hyperosmolarity with coma: Secondary | ICD-10-CM

## 2016-09-04 DIAGNOSIS — E119 Type 2 diabetes mellitus without complications: Secondary | ICD-10-CM

## 2016-09-04 DIAGNOSIS — E039 Hypothyroidism, unspecified: Secondary | ICD-10-CM

## 2016-09-04 DIAGNOSIS — Z79899 Other long term (current) drug therapy: Secondary | ICD-10-CM | POA: Insufficient documentation

## 2016-09-04 LAB — BASIC METABOLIC PANEL
BUN: 15 mg/dL (ref 7–25)
CO2: 25 mmol/L (ref 20–31)
Calcium: 9.3 mg/dL (ref 8.6–10.3)
Chloride: 102 mmol/L (ref 98–110)
Creat: 1.17 mg/dL (ref 0.70–1.33)
GLUCOSE: 208 mg/dL — AB (ref 65–99)
POTASSIUM: 4.3 mmol/L (ref 3.5–5.3)
Sodium: 137 mmol/L (ref 135–146)

## 2016-09-04 LAB — POCT GLYCOSYLATED HEMOGLOBIN (HGB A1C): Hemoglobin A1C: 10

## 2016-09-04 LAB — LIPID PANEL
CHOLESTEROL: 166 mg/dL (ref <200)
HDL: 33 mg/dL — ABNORMAL LOW (ref >40)
LDL Cholesterol: 88 mg/dL (ref <100)
Total CHOL/HDL Ratio: 5 Ratio — ABNORMAL HIGH (ref <5.0)
Triglycerides: 226 mg/dL — ABNORMAL HIGH (ref <150)
VLDL: 45 mg/dL — AB (ref <30)

## 2016-09-04 LAB — TSH: TSH: 15.71 mIU/L — ABNORMAL HIGH (ref 0.40–4.50)

## 2016-09-04 LAB — GLUCOSE, POCT (MANUAL RESULT ENTRY): POC GLUCOSE: 237 mg/dL — AB (ref 70–99)

## 2016-09-04 MED ORDER — METFORMIN HCL 500 MG PO TABS
500.0000 mg | ORAL_TABLET | Freq: Two times a day (BID) | ORAL | 2 refills | Status: DC
Start: 2016-09-04 — End: 2022-01-13

## 2016-09-04 MED ORDER — LEVOTHYROXINE SODIUM 50 MCG PO TABS: 50.0000 ug | ORAL_TABLET | Freq: Every day | ORAL | 1 refills | Status: DC

## 2016-09-04 MED ORDER — TRUEPLUS LANCETS 28G MISC: 1.0000 | Freq: Once | 12 refills | Status: AC

## 2016-09-04 MED ORDER — LISINOPRIL 10 MG PO TABS: 10.0000 mg | ORAL_TABLET | Freq: Every day | ORAL | 2 refills | Status: DC

## 2016-09-04 MED FILL — ?LISINOPRIL 10 MG TABLET: 10 | 30 days supply | Qty: 30 | Fill #0

## 2016-09-04 NOTE — Progress Notes (Signed)
Subjective:  Patient ID: Eric Larson, male    DOB: 1960/11/04  Age: 56 y.o. MRN: [id number]  CC: Diabetes and Hypothyroidism  HPI [name] presents to establish care for DM and hypothyroidism. He reports CBG at home range in low 200's. Non-smoker.He denies any vision changes. He denies any wounds. He denies any SOB or CP.   Outpatient Medications Prior to Visit  Medication Sig Dispense Refill  . Blood Glucose Monitoring Suppl (TRUE METRIX METER) W/DEVICE KIT 1 each by Does not apply route 3 (three) times daily. 1 kit 0  . glucose blood (TRUE METRIX BLOOD GLUCOSE TEST) test strip Use as instructed 100 each 12  . levothyroxine (SYNTHROID, LEVOTHROID) 50 MCG tablet Take 1 tablet (50 mcg total) by mouth daily before breakfast. Must have office visit for refills 30 tablet 0  . lisinopril (PRINIVIL,ZESTRIL) 5 MG tablet Take 1 tablet (5 mg total) by mouth daily. Must have office visit 30 tablet 0  . metFORMIN (GLUCOPHAGE) 500 MG tablet Take 1 tablet (500 mg total) by mouth 2 (two) times daily with a meal. Must have office visit 60 tablet 0   No facility-administered medications prior to visit.     ROS Review of Systems  Eyes: Negative.   Respiratory: Negative.   Cardiovascular: Negative.   Genitourinary: Negative.   Neurological: Negative for dizziness, light-headedness and headaches.   Objective:  BP (!) 162/90 (BP Location: Left Arm, Patient Position: Sitting, Cuff Size: Small)   Pulse 64   Ht 6' (1.829 m)   Wt 211 lb 6.4 oz (95.9 kg)   SpO2 98%   BMI 28.67 kg/m   BP/Weight 09/04/2016 11/08/2015 01/10/2584  Systolic BP 277 824 235  Diastolic BP 82 92 94  Wt. (Lbs) 211.4 199 193  BMI 28.67 26.98 26.17   Physical Exam  Constitutional: He is oriented to person, place, and time. He appears well-developed and well-nourished.  Eyes: Conjunctivae are normal. Pupils are equal, round, and reactive to light.  Neck: No JVD present.  Pulmonary/Chest: Effort normal and breath  sounds normal.  Abdominal: Soft. Bowel sounds are normal. He exhibits no mass. There is no tenderness.  Neurological: He is alert and oriented to person, place, and time.  Skin: Skin is warm and dry.  Monofilament exam is wnl.   Assessment & Plan:   Problem List Items Addressed This Visit      Endocrine   Type 2 diabetes mellitus with hyperosmolar nonketotic hyperglycemia (HCC) - Primary   Relevant Orders   POCT glucose (manual entry) (Completed)   POCT glycosylated hemoglobin (Hb A1C) (Completed)     Meds ordered this encounter  Medications  . lisinopril (PRINIVIL,ZESTRIL) 10 MG tablet    Sig: Take 1 tablet (10 mg total) by mouth daily.    Dispense:  30 tablet    Refill:  2    Order Specific Question:   Supervising Provider    Answer:   Tresa Garter [id number]  . metFORMIN (GLUCOPHAGE) 500 MG tablet    Sig: Take 1 tablet (500 mg total) by mouth 2 (two) times daily with a meal.    Dispense:  60 tablet    Refill:  2    Order Specific Question:   Supervising Provider    Answer:   Tresa Garter [id number]  . TRUEPLUS LANCETS 28G MISC    Sig: 1 kit by Does not apply route once.    Dispense:  100 each    Refill:  12  Order Specific Question:   Supervising Provider    Answer:   Tresa Garter [id number]  . levothyroxine (SYNTHROID, LEVOTHROID) 50 MCG tablet    Sig: Take 1 tablet (50 mcg total) by mouth daily before breakfast.    Dispense:  30 tablet    Refill:  1    Order Specific Question:   Supervising Provider    Answer:   Tresa Garter [[id number]]    Follow-up: Return in about 3 months (around 12/03/2016) for Diabetes & Hypertension.   Alfonse Spruce FNP

## 2016-09-04 NOTE — Patient Instructions (Signed)
Type 2 Diabetes Mellitus, Self Care, Adult When you have type 2 diabetes (type 2 diabetes mellitus), you must keep your blood sugar (glucose) under control. You can do this with:  Nutrition.  Exercise.  Lifestyle changes.  Medicines or insulin, if needed.  Support from your doctors and others. How do I manage my blood sugar?  Check your blood sugar level every day, as often as told.  Call your doctor if your blood sugar is above your goal numbers for 2 tests in a row.  Have your A1c (hemoglobin A1c) level checked at least two times a year. Have it checked more often if your doctor tells you to. Your doctor will set treatment goals for you. Generally, you should have these blood sugar levels:  Before meals (preprandial): 80-130 mg/dL (4.4-7.2 mmol/L).  After meals (postprandial): lower than 180 mg/dL (10 mmol/L).  A1c level: less than 7%. What do I need to know about high blood sugar? High blood sugar is called hyperglycemia. Know the signs of high blood sugar. Signs may include:  Feeling:  Thirsty.  Hungry.  Very tired.  Needing to pee (urinate) more than usual.  Blurry vision. What do I need to know about low blood sugar? Low blood sugar is called hypoglycemia. This is when blood sugar is at or below 70 mg/dL (3.9 mmol/L). Symptoms may include:  Feeling:  Hungry.  Worried or nervous (anxious).  Sweaty and clammy.  Confused.  Dizzy.  Sleepy.  Sick to your stomach (nauseous).  Having:  A fast heartbeat (palpitations).  A headache.  A change in your vision.  Jerky movements that you cannot control (seizure).  Nightmares.  Tingling or no feeling (numbness) around the mouth, lips, or tongue.  Having trouble with:  Talking.  Paying attention (concentrating).  Moving (coordination).  Sleeping.  Shaking.  Passing out (fainting).  Getting upset easily (irritability). Treating low blood sugar  To treat low blood sugar, eat or drink  something sugary right away. If you can think clearly and swallow safely, follow the 15:15 rule:  Take 15 grams of a fast-acting carb (carbohydrate). Some fast-acting carbs are:  1 tube of glucose gel.  3 sugar tablets (glucose pills).  6-8 pieces of hard candy.  4 oz (120 mL) of fruit juice.  4 oz (120 mL) regular (not diet) soda.  Check your blood sugar 15 minutes after you take the carb.  If your blood sugar is still at or below 70 mg/dL (3.9 mmol/L), take 15 grams of a carb again.  If your blood sugar does not go above 70 mg/dL (3.9 mmol/L) after 3 tries, get help right away.  After your blood sugar goes back to normal, eat a meal or a snack within 1 hour. Treating very low blood sugar  If your blood sugar is at or below 54 mg/dL (3 mmol/L), you have very low blood sugar (severe hypoglycemia). This is an emergency. Do not wait to see if the symptoms will go away. Get medical help right away. Call your local emergency services (911 in the [place] Do not drive yourself to the hospital. If you have very low blood sugar and you cannot eat or drink, you may need a glucagon shot (injection). A family member or friend should learn how to check your blood sugar and how to give you a glucagon shot. Ask your doctor if you need to have a glucagon shot kit at home. What else is important to manage my diabetes? Medicine  Follow these instructions   about insulin and diabetes medicines:  Take them as told by your doctor.  Adjust them as told by your doctor.  Do not run out of them. Having diabetes can raise your risk for other long-term conditions. These include heart or kidney disease. Your doctor may prescribe medicines to help prevent problems from diabetes. Food   Make healthy food choices. These include:  Chicken, fish, egg whites, and beans.  Oats, whole wheat, bulgur, brown rice, quinoa, and millet.  Fresh fruits and vegetables.  Low-fat dairy products.  Nuts, avocado, olive  oil, and canola oil.  Make a food plan with a specialist (dietitian).  Follow instructions from your doctor about what you cannot eat or drink.  Drink enough fluid to keep your pee (urine) clear or pale yellow.  Eat healthy snacks between healthy meals.  Keep track of carbs that you eat. Read food labels. Learn food serving sizes.  Follow your sick day plan when you cannot eat or drink normally. Make this plan with your doctor so it is ready to use. Activity  Exercise at least 3 times a week.  Do not go more than 2 days without exercising.  Talk with your doctor before you start a new exercise. Your doctor may need to adjust your insulin, medicines, or food. Lifestyle   Do not use any tobacco products. These include cigarettes, chewing tobacco, and e-cigarettes.If you need help quitting, ask your doctor.  Ask your doctor how much alcohol is safe for you.  Learn to deal with stress. If you need help with this, ask your doctor. Body care  Stay up to date with your shots (immunizations).  Have your eyes and feet checked by a doctor as often as told.  Check your skin and feet every day. Check for cuts, bruises, redness, blisters, or sores.  Brush your teeth and gums two times a day.  Floss at least one time a day.  Go to the dentist least one time every 6 months.  Stay at a healthy weight. General instructions   Take over-the-counter and prescription medicines only as told by your doctor.  Share your diabetes care plan with:  Your work or school.  People you live with.  Check your pee (urine) for ketones:  When you are sick.  As told by your doctor.  Carry a card or wear jewelry that says that you have diabetes.  Ask your doctor:  Do I need to meet with a diabetes educator?  Where can I find a support group for people with diabetes?  Keep all follow-up visits as told by your doctor. This is important. Where to find more information: To learn more about  diabetes, visit:  American Diabetes Association: www.diabetes.org  American Association of Diabetes Educators: www.diabeteseducator.org/patient-resources This information is not intended to replace advice given to you by your health care provider. Make sure you discuss any questions you have with your health care provider. Document Released: 12/11/2015 Document Revised: 01/25/2016 Document Reviewed: 09/22/2015 Elsevier Interactive Patient Education  2017 Elsevier Inc.  

## 2016-09-05 LAB — MICROALBUMIN / CREATININE URINE RATIO
CREATININE, URINE: 201 mg/dL (ref 20–370)
MICROALB UR: 10.1 mg/dL
Microalb Creat Ratio: 50 mcg/mg creat — ABNORMAL HIGH (ref <30)

## 2016-09-06 ENCOUNTER — Other Ambulatory Visit: Payer: Self-pay | Admitting: Family Medicine

## 2016-09-06 DIAGNOSIS — E782 Mixed hyperlipidemia: Secondary | ICD-10-CM

## 2016-09-06 DIAGNOSIS — E039 Hypothyroidism, unspecified: Secondary | ICD-10-CM

## 2016-09-06 MED ORDER — GEMFIBROZIL 600 MG PO TABS: 600.0000 mg | ORAL_TABLET | Freq: Two times a day (BID) | ORAL | 2 refills | Status: DC

## 2016-09-06 MED ORDER — LEVOTHYROXINE SODIUM 75 MCG PO TABS: 75.0000 ug | ORAL_TABLET | Freq: Every day | ORAL | 2 refills | Status: DC

## 2016-09-09 ENCOUNTER — Other Ambulatory Visit: Payer: Self-pay | Admitting: Family Medicine

## 2016-09-09 DIAGNOSIS — E781 Pure hyperglyceridemia: Secondary | ICD-10-CM

## 2016-09-09 DIAGNOSIS — E039 Hypothyroidism, unspecified: Secondary | ICD-10-CM

## 2016-09-10 ENCOUNTER — Telehealth: Payer: Self-pay | Admitting: *Deleted

## 2016-09-10 NOTE — Telephone Encounter (Signed)
Medical Assistant left message on patient's home and cell voicemail. Voicemail states to give a call back to Shareka Casale with [facility] at [phone].  

## 2016-09-10 NOTE — Telephone Encounter (Signed)
-----   Message from Lizbeth Bark, FNP sent at 09/06/2016  2:41 PM EST ----- -Triglycerides and lipids were elevated. This can increase your risk of heart disease.  You were prescribed gemfibrozil (Lopid) to lower these levels. -TSH level which looks at your thyroid function was elevated. I have increased your dose of levothyroxine to 75 mcg.  -Microalbumin/creatinine ratio level was elevated. This tests for protein in your urine that can indicate early signs of kidney damage. Continue to take your lisinopril as prescribed. -Follow up with labs to check your thyroid function and lipid levels again in 6 weeks.

## 2016-10-04 MED FILL — LEVOTHYROXINE 75 MCG TABLET: 75 | 30 days supply | Qty: 30 | Fill #0

## 2016-10-04 MED FILL — ?METFORMIN HCL 500MG TABLET: 500 | 30 days supply | Qty: 60 | Fill #0

## 2016-10-04 MED FILL — LISINOPRIL 5 MG TABLET: 5 | 30 days supply | Qty: 30 | Fill #0

## 2016-11-12 ENCOUNTER — Other Ambulatory Visit: Payer: Self-pay | Admitting: Internal Medicine

## 2016-11-12 DIAGNOSIS — E119 Type 2 diabetes mellitus without complications: Secondary | ICD-10-CM

## 2016-11-12 MED FILL — ?METFORMIN HCL 500MG TABLET: 500 | 30 days supply | Qty: 60 | Fill #1

## 2016-11-12 MED FILL — LEVOTHYROXINE 75 MCG TABLET: 75 | 30 days supply | Qty: 30 | Fill #1

## 2016-11-13 ENCOUNTER — Other Ambulatory Visit: Payer: Self-pay | Admitting: Pharmacist

## 2016-11-13 ENCOUNTER — Other Ambulatory Visit: Payer: Self-pay | Admitting: Internal Medicine

## 2016-11-13 DIAGNOSIS — E119 Type 2 diabetes mellitus without complications: Secondary | ICD-10-CM

## 2016-11-13 MED ORDER — LISINOPRIL 10 MG PO TABS: 10.0000 mg | ORAL_TABLET | Freq: Every day | ORAL | 0 refills | Status: DC

## 2016-11-13 MED FILL — ?LISINOPRIL 10 MG TABLET: 10 | 30 days supply | Qty: 30 | Fill #0

## 2016-11-18 ENCOUNTER — Other Ambulatory Visit: Payer: Self-pay | Admitting: Internal Medicine

## 2017-09-08 ENCOUNTER — Other Ambulatory Visit: Payer: Self-pay | Admitting: Family Medicine

## 2017-09-08 DIAGNOSIS — E119 Type 2 diabetes mellitus without complications: Secondary | ICD-10-CM

## 2017-09-08 DIAGNOSIS — E039 Hypothyroidism, unspecified: Secondary | ICD-10-CM

## 2017-11-03 ENCOUNTER — Emergency Department (HOSPITAL_COMMUNITY)
Admission: EM | Admit: 2017-11-03 | Discharge: 2017-11-03 | Disposition: A | Discharge status: Home / Self Care | Payer: Medicaid Other | Attending: Emergency Medicine | Admitting: Emergency Medicine

## 2017-11-03 ENCOUNTER — Encounter (HOSPITAL_COMMUNITY): Payer: Self-pay

## 2017-11-03 ENCOUNTER — Other Ambulatory Visit: Payer: Self-pay

## 2017-11-03 DIAGNOSIS — E1122 Type 2 diabetes mellitus with diabetic chronic kidney disease: Secondary | ICD-10-CM | POA: Insufficient documentation

## 2017-11-03 DIAGNOSIS — R1310 Dysphagia, unspecified: Secondary | ICD-10-CM | POA: Insufficient documentation

## 2017-11-03 DIAGNOSIS — Z7984 Long term (current) use of oral hypoglycemic drugs: Secondary | ICD-10-CM | POA: Diagnosis not present

## 2017-11-03 DIAGNOSIS — R590 Localized enlarged lymph nodes: Secondary | ICD-10-CM | POA: Diagnosis not present

## 2017-11-03 DIAGNOSIS — H7392 Unspecified disorder of tympanic membrane, left ear: Secondary | ICD-10-CM

## 2017-11-03 DIAGNOSIS — H9202 Otalgia, left ear: Secondary | ICD-10-CM | POA: Insufficient documentation

## 2017-11-03 DIAGNOSIS — B9789 Other viral agents as the cause of diseases classified elsewhere: Secondary | ICD-10-CM | POA: Insufficient documentation

## 2017-11-03 DIAGNOSIS — R0981 Nasal congestion: Secondary | ICD-10-CM | POA: Diagnosis not present

## 2017-11-03 DIAGNOSIS — E039 Hypothyroidism, unspecified: Secondary | ICD-10-CM | POA: Diagnosis not present

## 2017-11-03 DIAGNOSIS — R05 Cough: Secondary | ICD-10-CM | POA: Diagnosis present

## 2017-11-03 DIAGNOSIS — J029 Acute pharyngitis, unspecified: Secondary | ICD-10-CM | POA: Insufficient documentation

## 2017-11-03 DIAGNOSIS — N189 Chronic kidney disease, unspecified: Secondary | ICD-10-CM | POA: Insufficient documentation

## 2017-11-03 DIAGNOSIS — J028 Acute pharyngitis due to other specified organisms: Secondary | ICD-10-CM | POA: Insufficient documentation

## 2017-11-03 DIAGNOSIS — R0789 Other chest pain: Secondary | ICD-10-CM | POA: Insufficient documentation

## 2017-11-03 LAB — RAPID STREP SCREEN (MED CTR MEBANE ONLY): STREPTOCOCCUS, GROUP A SCREEN (DIRECT): NEGATIVE

## 2017-11-03 MED ORDER — ACETIC ACID 2 % OT SOLN: 4.0000 [drp] | Freq: Three times a day (TID) | OTIC | 0 refills | Status: AC

## 2017-11-03 MED ORDER — ACETAMINOPHEN 325 MG PO TABS
650.0000 mg | ORAL_TABLET | Freq: Once | ORAL | Status: AC
  Administered 2017-11-03: 650 mg via ORAL
  Filled 2017-11-03: qty 2

## 2017-11-03 NOTE — Discharge Instructions (Signed)
Use eardrops as prescribed.  Take [phone] mg of Tylenol every 6 hours for pain.  Use warm water salt gargles 3 times daily, warm teas, and over-the-counter sprays and lozenges for sore throat.  You may also take over-the-counter allergy medicines for your nasal congestion and sore throat.  Drink plenty of fluids and get plenty of rest.  Follow-up with your primary care physician for reevaluation of symptoms.  Return to the emergency department if any concerning signs or symptoms develop such as fever, shortness of breath, worsening cough, worsening chest pain, or increase in your blood sugars.

## 2017-11-03 NOTE — ED Notes (Signed)
Called pt for traige, no answer.  

## 2017-11-03 NOTE — ED Triage Notes (Signed)
Pt endorses cough, sore throat, congestion x 1 week. VSS, Afebrile.

## 2017-11-03 NOTE — ED Provider Notes (Signed)
[facility] EMERGENCY DEPARTMENT Provider Note   CSN: [id number] Arrival date & time: 11/03/17  1528     History   Chief Complaint Chief Complaint  Patient presents with  . URI    HPI Eric Larson is a 57 y.o. male with history of DM, hypothyroidism, GERD, hypertension, CKD presents today for evaluation of acute onset, mildly improving URI symptoms for 1 week.  He endorses nonproductive cough, sore throat, and mild nasal congestion.  He notes dull pain in his left ear constantly with sharp pain when he coughs.  He also notes sharp pain in the anterior chest wall when he coughs.  He denies shortness of breath.  He is a non-smoker. He denies facial swelling or drooling.  He notes increased pain when he swallows but is able to swallow without difficulty and tolerates p.o. food and fluids without difficulty.His blood sugar has been running around 130s and has not increased while he has been experiencing the symptoms.  He has been using over-the-counter cold medicines without significant relief of his symptoms.  The history is provided by the patient.    Past Medical History:  Diagnosis Date  . Acid reflux   . Diabetes mellitus without complication ([place])   . Gout     Patient Active Problem List   Diagnosis Date Noted  . Diabetes mellitus ([place]) 10/22/2013  . Hypothyroidism 10/08/2013  . AKI (acute kidney injury) ([place]) 10/07/2013  . Hyperglycemia without ketosis 10/06/2013  . DKA (diabetic ketoacidoses) ([place]) 10/06/2013  . Elevated blood pressure 10/06/2013  . History of gout 10/06/2013  . History of goiter 10/06/2013  . Type 2 diabetes mellitus with hyperosmolar nonketotic hyperglycemia ([place]) 10/06/2013  . CKD (chronic kidney disease) 10/06/2013  . Obesity 10/06/2013  . GERD (gastroesophageal reflux disease) 10/06/2013    Past Surgical History:  Procedure Laterality Date  . THYROID SURGERY         Home Medications    Prior to Admission medications    Medication Sig Start Date End Date Taking? Authorizing Provider  acetic acid 2 % otic solution Place 4 drops into the left ear 3 (three) times daily for 5 days. 11/03/17 11/08/17  Rodell Perna A, PA-C  Blood Glucose Monitoring Suppl (TRUE METRIX METER) W/DEVICE KIT 1 each by Does not apply route 3 (three) times daily. 08/25/15   Tresa Garter, MD  gemfibrozil (LOPID) 600 MG tablet Take 1 tablet (600 mg total) by mouth 2 (two) times daily before a meal. 09/06/16 12/05/16  Fredia Beets R, FNP  glucose blood test strip Use as instructed 11/18/16   Alfonse Spruce, FNP  levothyroxine (SYNTHROID, LEVOTHROID) 75 MCG tablet Take 1 tablet (75 mcg total) by mouth daily. 09/06/16   Alfonse Spruce, FNP  lisinopril (PRINIVIL,ZESTRIL) 10 MG tablet Take 1 tablet (10 mg total) by mouth daily. 11/13/16   Alfonse Spruce, FNP  metFORMIN (GLUCOPHAGE) 500 MG tablet Take 1 tablet (500 mg total) by mouth 2 (two) times daily with a meal. 09/04/16   Alfonse Spruce, FNP    Family History Family History  Problem Relation Age of Onset  . Diabetes Mellitus II Neg Hx   . CAD Neg Hx     Social History Social History   Tobacco Use  . Smoking status: Never Smoker  . Smokeless tobacco: Never Used  Substance Use Topics  . Alcohol use: No  . Drug use: No     Allergies   Patient has no known allergies.  Review of Systems Review of Systems  Constitutional: Negative for chills and fever.  HENT: Positive for congestion and sore throat. Negative for trouble swallowing.   Respiratory: Positive for cough. Negative for shortness of breath.   Cardiovascular: Positive for chest pain (with cough only).     Physical Exam Updated Vital Signs BP (!) 149/102 (BP Location: Right Arm)   Pulse 85   Temp 98 F (36.7 C) (Oral)   Ht 6' (1.829 m)   Wt 97.5 kg (215 lb)   SpO2 100%   BMI 29.16 kg/m   Physical Exam  Constitutional: He appears well-developed and well-nourished. No distress.  HENT:   Head: Normocephalic and atraumatic.  Right Ear: External ear normal.  Left Ear: External ear normal.  Left TM with very mild erythema but no bulging or perforation.  No mastoid tenderness bilaterally.  Middle ear effusion noted bilaterally.  No frontal or maxillary sinus tenderness.  Septum midline with mild mucosal edema bilaterally.  Posterior oropharynx with tonsillar hypertrophy and erythema but no exudates or uvular deviation.  No trismus or sublingual abnormalities.  Eyes: Conjunctivae and EOM are normal. Pupils are equal, round, and reactive to light. Right eye exhibits no discharge. Left eye exhibits no discharge.  Neck: Normal range of motion and full passive range of motion without pain. Neck supple. No JVD present. No tracheal deviation present.  Left anterior cervical lymphadenopathy.  Cardiovascular: Normal rate, regular rhythm and normal heart sounds.  Pulmonary/Chest: Effort normal and breath sounds normal. No stridor. No respiratory distress. He has no wheezes. He has no rales. He exhibits tenderness.  Mild anterior parasternal tenderness to palpation bilaterally with no deformity, crepitus, or paradoxical wall motion.  Equal rise and fall of chest, no increased work of breathing.  Speaking in full sentences without difficulty.  Abdominal: Soft. Bowel sounds are normal. He exhibits no distension. There is no tenderness.  Musculoskeletal: He exhibits no edema.  Lymphadenopathy:    He has cervical adenopathy.  Neurological: He is alert.  Skin: Skin is warm and dry. No erythema.  Psychiatric: He has a normal mood and affect. His behavior is normal.  Nursing note and vitals reviewed.    ED Treatments / Results  Labs (all labs ordered are listed, but only abnormal results are displayed) Labs Reviewed  RAPID STREP SCREEN (NOT AT [facility])  CULTURE, GROUP A STREP [facility])    EKG  EKG Interpretation None       Radiology No results found.  Procedures Procedures (including  critical care time)  Medications Ordered in ED Medications  acetaminophen (TYLENOL) tablet 650 mg (not administered)     Initial Impression / Assessment and Plan / ED Course  I have reviewed the triage vital signs and the nursing notes.  Pertinent labs & imaging results that were available during my care of the patient were reviewed by me and considered in my medical decision making (see chart for details).     Pt afebrile without tonsillar exudate, negative strep. Presents with mild cervical lymphadenopathy, & dysphagia; diagnosis of viral pharyngitis. Also has mild left TM erythema but does not appear to be acute suppurative AOM.  No abx indicated.  Chest pain is reproducible on palpation and does not appear to be cardiac in etiology.  Lungs clear to auscultation bilaterally and I doubt pneumonia in the absence of fever.  Blood sugars have been trending well at home.  Discharge with symptomatic tx for pain  Pt does not appear dehydrated, but did discuss importance  of water rehydration. Presentation non concerning for PTA or infxn spread to soft tissue.  No meningeal signs to suggest meningitis.  No trismus or uvula deviation. Specific return precautions discussed. Pt able to drink water in ED without difficulty with intact air way. Recommended PCP follow up. Pt verbalized understanding of and agreement with plan and is safe for discharge home at this time.  Final Clinical Impressions(s) / ED Diagnoses   Final diagnoses:  Acute viral pharyngitis  Tympanic membrane irritation, left    ED Discharge Orders        Ordered    acetic acid 2 % otic solution  3 times daily     11/03/17 1707       Renita Papa, PA-C 11/03/17 [place], [place], DO 11/03/17 1721

## 2017-11-05 LAB — CULTURE, GROUP A STREP (THRC)

## 2017-11-06 ENCOUNTER — Telehealth: Payer: Self-pay | Admitting: *Deleted

## 2017-11-06 NOTE — Progress Notes (Signed)
ED Antimicrobial Stewardship Positive Culture Follow Up   Eric Larson is an 57 y.o. male who presented to [facility] on 11/03/2017 with a chief complaint of non-productive cough, sore throat, and congestion.   Chief Complaint  Patient presents with  . URI    Recent Results (from the past 720 hour(s))  Rapid strep screen     Status: None   Collection Time: 11/03/17  4:03 PM  Result Value Ref Range Status   Streptococcus, Group A Screen (Direct) NEGATIVE NEGATIVE Final    Comment: (NOTE) A Rapid Antigen test may result negative if the antigen level in the sample is below the detection level of this test. The FDA has not cleared this test as a stand-alone test therefore the rapid antigen negative result has reflexed to a Group A Strep culture.   Culture, group A strep     Status: None   Collection Time: 11/03/17  4:03 PM  Result Value Ref Range Status   Specimen Description THROAT  Final   Special Requests NONE Reflexed from [id number]  Final   Culture ABUNDANT GROUP A STREP (S.PYOGENES) ISOLATED  Final   Report Status 11/05/2017 FINAL  Final    [x]  Patient discharged originally without antimicrobial agent and treatment is now indicated  New antibiotic prescription: Amoxicillin 500 mg po BID x 10 days.    ED Provider: Mathews Robinsons, PA-C  Adline Potter, PharmD Pharmacy Resident Pager: [phone]

## 2017-11-21 NOTE — Telephone Encounter (Signed)
Letter to address on file has been returned.  Unable to make contact with patient.  No further treatment has been initiated.

## 2018-01-06 ENCOUNTER — Other Ambulatory Visit: Payer: Self-pay

## 2018-01-06 ENCOUNTER — Emergency Department (HOSPITAL_COMMUNITY)
Admission: EM | Admit: 2018-01-06 | Discharge: 2018-01-06 | Disposition: A | Discharge status: Home / Self Care | Payer: Medicaid Other | Attending: Emergency Medicine | Admitting: Emergency Medicine

## 2018-01-06 ENCOUNTER — Emergency Department (HOSPITAL_COMMUNITY): Payer: Medicaid Other

## 2018-01-06 ENCOUNTER — Encounter (HOSPITAL_COMMUNITY): Payer: Self-pay

## 2018-01-06 DIAGNOSIS — E1122 Type 2 diabetes mellitus with diabetic chronic kidney disease: Secondary | ICD-10-CM | POA: Diagnosis not present

## 2018-01-06 DIAGNOSIS — N189 Chronic kidney disease, unspecified: Secondary | ICD-10-CM | POA: Insufficient documentation

## 2018-01-06 DIAGNOSIS — E039 Hypothyroidism, unspecified: Secondary | ICD-10-CM | POA: Insufficient documentation

## 2018-01-06 DIAGNOSIS — N132 Hydronephrosis with renal and ureteral calculous obstruction: Secondary | ICD-10-CM | POA: Insufficient documentation

## 2018-01-06 DIAGNOSIS — I129 Hypertensive chronic kidney disease with stage 1 through stage 4 chronic kidney disease, or unspecified chronic kidney disease: Secondary | ICD-10-CM | POA: Diagnosis not present

## 2018-01-06 DIAGNOSIS — R109 Unspecified abdominal pain: Secondary | ICD-10-CM | POA: Diagnosis present

## 2018-01-06 DIAGNOSIS — Z79899 Other long term (current) drug therapy: Secondary | ICD-10-CM | POA: Diagnosis not present

## 2018-01-06 DIAGNOSIS — N200 Calculus of kidney: Secondary | ICD-10-CM

## 2018-01-06 DIAGNOSIS — I10 Essential (primary) hypertension: Secondary | ICD-10-CM

## 2018-01-06 DIAGNOSIS — Z7984 Long term (current) use of oral hypoglycemic drugs: Secondary | ICD-10-CM | POA: Insufficient documentation

## 2018-01-06 LAB — LIPASE, BLOOD: LIPASE: 43 U/L (ref 11–51)

## 2018-01-06 LAB — COMPREHENSIVE METABOLIC PANEL
ALBUMIN: 3.8 g/dL (ref 3.5–5.0)
ALT: 42 U/L (ref 17–63)
AST: 31 U/L (ref 15–41)
Alkaline Phosphatase: 43 U/L (ref 38–126)
Anion gap: 9 (ref 5–15)
BUN: 14 mg/dL (ref 6–20)
CALCIUM: 9.4 mg/dL (ref 8.9–10.3)
CHLORIDE: 101 mmol/L (ref 101–111)
CO2: 26 mmol/L (ref 22–32)
Creatinine, Ser: 1.5 mg/dL — ABNORMAL HIGH (ref 0.61–1.24)
GFR calc Af Amer: 58 mL/min — ABNORMAL LOW (ref >60)
GFR calc non Af Amer: 50 mL/min — ABNORMAL LOW (ref >60)
GLUCOSE: 188 mg/dL — AB (ref 65–99)
POTASSIUM: 3.7 mmol/L (ref 3.5–5.1)
Sodium: 136 mmol/L (ref 135–145)
Total Bilirubin: 1 mg/dL (ref 0.3–1.2)
Total Protein: 8.7 g/dL — ABNORMAL HIGH (ref 6.5–8.1)

## 2018-01-06 LAB — URINALYSIS, ROUTINE W REFLEX MICROSCOPIC
BACTERIA UA: NONE SEEN
BILIRUBIN URINE: NEGATIVE
GLUCOSE, UA: 150 mg/dL — AB
Ketones, ur: 5 mg/dL — AB
Leukocytes, UA: NEGATIVE
NITRITE: NEGATIVE
Protein, ur: 100 mg/dL — AB
SPECIFIC GRAVITY, URINE: 1.014 (ref 1.005–1.030)
pH: 6 (ref 5.0–8.0)

## 2018-01-06 LAB — CBC
HCT: 43 % (ref 39.0–52.0)
HEMOGLOBIN: 15 g/dL (ref 13.0–17.0)
MCH: 29.9 pg (ref 26.0–34.0)
MCHC: 34.9 g/dL (ref 30.0–36.0)
MCV: 85.7 fL (ref 78.0–100.0)
PLATELETS: 162 10*3/uL (ref 150–400)
RBC: 5.02 MIL/uL (ref 4.22–5.81)
RDW: 13.6 % (ref 11.5–15.5)
WBC: 7.2 10*3/uL (ref 4.0–10.5)

## 2018-01-06 MED ORDER — ONDANSETRON 4 MG PO TBDP
4.0000 mg | ORAL_TABLET | Freq: Once | ORAL | Status: AC | PRN
  Administered 2018-01-06: 4 mg via ORAL
  Filled 2018-01-06: qty 1

## 2018-01-06 MED ORDER — OXYCODONE-ACETAMINOPHEN 5-325 MG PO TABS: 1.0000 | ORAL_TABLET | ORAL | 0 refills | Status: DC | PRN

## 2018-01-06 MED ORDER — OXYCODONE-ACETAMINOPHEN 5-325 MG PO TABS
1.0000 | ORAL_TABLET | ORAL | Status: DC | PRN
  Administered 2018-01-06: 1 via ORAL
  Filled 2018-01-06: qty 1

## 2018-01-06 MED ORDER — KETOROLAC TROMETHAMINE 30 MG/ML IJ SOLN
30.0000 mg | Freq: Once | INTRAMUSCULAR | Status: AC
  Administered 2018-01-06: 30 mg via INTRAMUSCULAR
  Filled 2018-01-06: qty 1

## 2018-01-06 MED ORDER — TAMSULOSIN HCL 0.4 MG PO CAPS: 0.4000 mg | ORAL_CAPSULE | Freq: Every day | ORAL | 0 refills | Status: DC

## 2018-01-06 NOTE — ED Notes (Signed)
Offered pt a warm blanket or ice pack. Pt stated that it wouldn't help because he is in to much pain.

## 2018-01-06 NOTE — ED Provider Notes (Signed)
[facility] EMERGENCY DEPARTMENT Provider Note   CSN: [id number] Arrival date & time: 01/06/18  3532     History   Chief Complaint Chief Complaint  Patient presents with  . Flank Pain    HPI Eric Larson is a 57 y.o. male.  HPI   57 year old male presents today with complaints of right-sided flank pain.  Patient reports yesterday he had acute onset of sharp pain in his right flank and mid abdomen.  Patient denies any change in symptoms, denies any aggravating features.  He did not take any medication for this.  He denies any associated nausea vomiting fever, denies any dysuria or abnormal bowel movements.  No history of the same.  Past Medical History:  Diagnosis Date  . Acid reflux   . Diabetes mellitus without complication ([place])   . Gout     Patient Active Problem List   Diagnosis Date Noted  . Diabetes mellitus ([place]) 10/22/2013  . Hypothyroidism 10/08/2013  . AKI (acute kidney injury) ([place]) 10/07/2013  . Hyperglycemia without ketosis 10/06/2013  . DKA (diabetic ketoacidoses) ([place]) 10/06/2013  . Elevated blood pressure 10/06/2013  . History of gout 10/06/2013  . History of goiter 10/06/2013  . Type 2 diabetes mellitus with hyperosmolar nonketotic hyperglycemia ([place]) 10/06/2013  . CKD (chronic kidney disease) 10/06/2013  . Obesity 10/06/2013  . GERD (gastroesophageal reflux disease) 10/06/2013    Past Surgical History:  Procedure Laterality Date  . THYROID SURGERY          Home Medications    Prior to Admission medications   Medication Sig Start Date End Date Taking? Authorizing Provider  Blood Glucose Monitoring Suppl (TRUE METRIX METER) W/DEVICE KIT 1 each by Does not apply route 3 (three) times daily. 08/25/15   Tresa Garter, MD  gemfibrozil (LOPID) 600 MG tablet Take 1 tablet (600 mg total) by mouth 2 (two) times daily before a meal. 09/06/16 12/05/16  Fredia Beets R, FNP  glucose blood test strip Use as instructed 11/18/16    Alfonse Spruce, FNP  levothyroxine (SYNTHROID, LEVOTHROID) 75 MCG tablet Take 1 tablet (75 mcg total) by mouth daily. 09/06/16   Alfonse Spruce, FNP  lisinopril (PRINIVIL,ZESTRIL) 10 MG tablet Take 1 tablet (10 mg total) by mouth daily. 11/13/16   Alfonse Spruce, FNP  metFORMIN (GLUCOPHAGE) 500 MG tablet Take 1 tablet (500 mg total) by mouth 2 (two) times daily with a meal. 09/04/16   Hairston, Maylon Peppers, FNP  oxyCODONE-acetaminophen (PERCOCET/ROXICET) 5-325 MG tablet Take 1 tablet by mouth every 4 (four) hours as needed for severe pain. 01/06/18   Everett Ricciardelli, Dellis Filbert, PA-C  tamsulosin (FLOMAX) 0.4 MG CAPS capsule Take 1 capsule (0.4 mg total) by mouth daily. 01/06/18   Okey Regal, PA-C    Family History Family History  Problem Relation Age of Onset  . Diabetes Mellitus II Neg Hx   . CAD Neg Hx     Social History Social History   Tobacco Use  . Smoking status: Never Smoker  . Smokeless tobacco: Never Used  Substance Use Topics  . Alcohol use: No  . Drug use: No     Allergies   Patient has no known allergies.   Review of Systems Review of Systems  All other systems reviewed and are negative.    Physical Exam Updated Vital Signs BP (!) 176/102 (BP Location: Right Arm) Comment: RN notified/ Pt states he did not take his BP medication today  Pulse (!) 56   Temp  98.1 F (36.7 C) (Oral)   Resp 17   SpO2 95%   Physical Exam  Constitutional: He is oriented to person, place, and time. He appears well-developed and well-nourished.  HENT:  Head: Normocephalic and atraumatic.  Eyes: Pupils are equal, round, and reactive to light. Conjunctivae are normal. Right eye exhibits no discharge. Left eye exhibits no discharge. No scleral icterus.  Neck: Normal range of motion. No JVD present. No tracheal deviation present.  Pulmonary/Chest: Effort normal. No stridor.  Abdominal: Soft. He exhibits no distension and no mass. There is no tenderness. There is no rebound and  no guarding. No hernia.  No CVA tenderness  Neurological: He is alert and oriented to person, place, and time. Coordination normal.  Psychiatric: He has a normal mood and affect. His behavior is normal. Judgment and thought content normal.  Nursing note and vitals reviewed.    ED Treatments / Results  Labs (all labs ordered are listed, but only abnormal results are displayed) Labs Reviewed  COMPREHENSIVE METABOLIC PANEL - Abnormal; Notable for the following components:      Result Value   Glucose, Bld 188 (*)    Creatinine, Ser 1.50 (*)    Total Protein 8.7 (*)    GFR calc non Af Amer 50 (*)    GFR calc Af Amer 58 (*)    All other components within normal limits  URINALYSIS, ROUTINE W REFLEX MICROSCOPIC - Abnormal; Notable for the following components:   Color, Urine STRAW (*)    Glucose, UA 150 (*)    Hgb urine dipstick SMALL (*)    Ketones, ur 5 (*)    Protein, ur 100 (*)    All other components within normal limits  LIPASE, BLOOD  CBC    EKG None  Radiology Ct Renal Stone Study  Result Date: 01/06/2018 CLINICAL DATA:  Right-sided flank pain.  Nausea vomiting. EXAM: CT ABDOMEN AND PELVIS WITHOUT CONTRAST TECHNIQUE: Multidetector CT imaging of the abdomen and pelvis was performed following the standard protocol without IV contrast. COMPARISON:  November 23, 2016 FINDINGS: Lower chest: Mild dependent atelectasis. Cardiomegaly. No other abnormalities in the lung bases. Hepatobiliary: There is a cyst in the right hepatic lobe. The liver and gallbladder are otherwise normal. Pancreas: Low-attenuation the pancreatic tail on axial image 28 is consistent with a small the positive fat of no significance. This is a stable finding. The pancreas is otherwise normal. Spleen: Normal in size without focal abnormality. Adrenals/Urinary Tract: Small bilateral renal stones. Mild hydronephrosis and moderate perinephric stranding on the right. Right ureterectasis due to a distal right ureteral stone  measuring 4 mm. No other abnormalities in the kidneys, ureters, or bladder. Adrenal glands are normal. Stomach/Bowel: The distal esophagus, stomach, and small bowel are normal. The colon is unremarkable. The appendix is normal. Vascular/Lymphatic: Mild atherosclerosis in the nonaneurysmal aorta. Atherosclerotic changes extend into the iliac and femoral vessels. No adenopathy. Reproductive: Prostate is unremarkable. Other: No free air or free fluid. Musculoskeletal: No acute or significant osseous findings. IMPRESSION: 1. There is a 4 mm stone in the distal right ureter resulting in mild hydronephrosis, mild ureterectasis, and moderate right perinephric stranding. This is consistent with the patient's symptoms. 2. Multiple small stones in both kidneys. 3. Atherosclerosis in the abdominal aorta. Electronically Signed   By: Dorise Bullion III M.D   On: 01/06/2018 14:00    Procedures Procedures (including critical care time)  Medications Ordered in ED Medications  oxyCODONE-acetaminophen (PERCOCET/ROXICET) 5-325 MG per tablet 1 tablet (  1 tablet Oral Given 01/06/18 0919)  ondansetron (ZOFRAN-ODT) disintegrating tablet 4 mg (4 mg Oral Given 01/06/18 0919)  ketorolac (TORADOL) 30 MG/ML injection 30 mg (30 mg Intramuscular Given 01/06/18 1254)     Initial Impression / Assessment and Plan / ED Course  I have reviewed the triage vital signs and the nursing notes.  Pertinent labs & imaging results that were available during my care of the patient were reviewed by me and considered in my medical decision making (see chart for details).      Labs: UA, Lipase, CMP, CBC, CT Renal stone study   Imaging: CT renal study  Consults:  Therapeutics: Zofran, Percocet, Toradol  Discharge Meds:   Assessment/Plan: 57 year old male presents today with ureterolithiasis.  Patient has a 4 mm stone at the right UVJ.  He has hydronephrosis, no signs of infection, reassuring urine, pain has completely resolved with Toradol  here.  Patient does not have a history of kidney stones, he will be referred to urology, he is given strict return precautions, he verbalized understanding and agreement to today's plan had no further questions or concerns.   Final Clinical Impressions(s) / ED Diagnoses   Final diagnoses:  Kidney stones  Hypertension, unspecified type    ED Discharge Orders        Ordered    oxyCODONE-acetaminophen (PERCOCET/ROXICET) 5-325 MG tablet  Every 4 hours PRN     01/06/18 1429    tamsulosin (FLOMAX) 0.4 MG CAPS capsule  Daily     01/06/18 1429       Okey Regal, PA-C 01/06/18 1443    Tegeler, Gwenyth Allegra, MD 01/06/18 2355

## 2018-01-06 NOTE — ED Triage Notes (Signed)
Pt reports some right side flank pain that started last night. Pt states the pain radiates around to his back. Denies dysuria. Pt reports nausea and vomiting.

## 2018-01-06 NOTE — Discharge Instructions (Addendum)
Please read attached information. If you experience any new or worsening signs or symptoms please return to the emergency room for evaluation. Please follow-up with your primary care provider or specialist as discussed. Please use medication prescribed only as directed and discontinue taking if you have any concerning signs or symptoms.   °

## 2018-01-06 NOTE — ED Notes (Signed)
Patient transported to CT 

## 2018-01-21 ENCOUNTER — Emergency Department (HOSPITAL_COMMUNITY)
Admission: EM | Admit: 2018-01-21 | Discharge: 2018-01-21 | Disposition: A | Discharge status: Home / Self Care | Payer: Medicaid Other | Attending: Emergency Medicine | Admitting: Emergency Medicine

## 2018-01-21 ENCOUNTER — Other Ambulatory Visit: Payer: Self-pay

## 2018-01-21 ENCOUNTER — Encounter (HOSPITAL_COMMUNITY): Payer: Self-pay

## 2018-01-21 DIAGNOSIS — R109 Unspecified abdominal pain: Secondary | ICD-10-CM | POA: Insufficient documentation

## 2018-01-21 DIAGNOSIS — M79602 Pain in left arm: Secondary | ICD-10-CM | POA: Insufficient documentation

## 2018-01-21 NOTE — ED Triage Notes (Signed)
Pt endorses being seen here recently for right side abd pain/flank pain and dx with kidney stones. Pt states "the pain has not gotten better" Pt also here for left arm pain after slipping and falling Sunday. VSS.

## 2018-01-26 ENCOUNTER — Emergency Department (HOSPITAL_COMMUNITY)
Admission: EM | Admit: 2018-01-26 | Discharge: 2018-01-26 | Disposition: A | Discharge status: Home / Self Care | Payer: Medicaid Other | Attending: Emergency Medicine | Admitting: Emergency Medicine

## 2018-01-26 ENCOUNTER — Emergency Department (HOSPITAL_COMMUNITY): Payer: Medicaid Other

## 2018-01-26 ENCOUNTER — Encounter (HOSPITAL_COMMUNITY): Payer: Self-pay

## 2018-01-26 DIAGNOSIS — W0110XA Fall on same level from slipping, tripping and stumbling with subsequent striking against unspecified object, initial encounter: Secondary | ICD-10-CM | POA: Diagnosis not present

## 2018-01-26 DIAGNOSIS — E039 Hypothyroidism, unspecified: Secondary | ICD-10-CM | POA: Diagnosis not present

## 2018-01-26 DIAGNOSIS — E1122 Type 2 diabetes mellitus with diabetic chronic kidney disease: Secondary | ICD-10-CM | POA: Diagnosis not present

## 2018-01-26 DIAGNOSIS — Z7984 Long term (current) use of oral hypoglycemic drugs: Secondary | ICD-10-CM | POA: Insufficient documentation

## 2018-01-26 DIAGNOSIS — S52022A Displaced fracture of olecranon process without intraarticular extension of left ulna, initial encounter for closed fracture: Secondary | ICD-10-CM | POA: Insufficient documentation

## 2018-01-26 DIAGNOSIS — Y999 Unspecified external cause status: Secondary | ICD-10-CM | POA: Insufficient documentation

## 2018-01-26 DIAGNOSIS — Y929 Unspecified place or not applicable: Secondary | ICD-10-CM | POA: Diagnosis not present

## 2018-01-26 DIAGNOSIS — Y9301 Activity, walking, marching and hiking: Secondary | ICD-10-CM | POA: Insufficient documentation

## 2018-01-26 DIAGNOSIS — Z79899 Other long term (current) drug therapy: Secondary | ICD-10-CM | POA: Diagnosis not present

## 2018-01-26 DIAGNOSIS — N189 Chronic kidney disease, unspecified: Secondary | ICD-10-CM | POA: Diagnosis not present

## 2018-01-26 DIAGNOSIS — S59912A Unspecified injury of left forearm, initial encounter: Secondary | ICD-10-CM | POA: Diagnosis present

## 2018-01-26 MED ORDER — HYDROCODONE-ACETAMINOPHEN 5-325 MG PO TABS: 1.0000 | ORAL_TABLET | Freq: Four times a day (QID) | ORAL | 0 refills | Status: DC | PRN

## 2018-01-26 NOTE — ED Provider Notes (Signed)
[facility] EMERGENCY DEPARTMENT Provider Note   CSN: [id number] Arrival date & time: 01/26/18  1609     History   Chief Complaint Chief Complaint  Patient presents with  . Elbow Pain    HPI Eric Larson is a 57 y.o. male with a history of gout, diabetes, hypertension, who presents today for evaluation of left elbow pain.  He reports that about 2 weeks ago he had a mechanical trip and fall landing directly on his bent left elbow.  He reports that he "thought I could just ignore it and it would get better."  He reports that it was getting better, however over the past few nights he has had pain when he tries to sleep.  He denies any fevers or chills.  He does report that it is swollen.  HPI  Past Medical History:  Diagnosis Date  . Acid reflux   . Diabetes mellitus without complication ([place])   . Gout     Patient Active Problem List   Diagnosis Date Noted  . Diabetes mellitus ([place]) 10/22/2013  . Hypothyroidism 10/08/2013  . AKI (acute kidney injury) ([place]) 10/07/2013  . Hyperglycemia without ketosis 10/06/2013  . DKA (diabetic ketoacidoses) ([place]) 10/06/2013  . Elevated blood pressure 10/06/2013  . History of gout 10/06/2013  . History of goiter 10/06/2013  . Type 2 diabetes mellitus with hyperosmolar nonketotic hyperglycemia ([place]) 10/06/2013  . CKD (chronic kidney disease) 10/06/2013  . Obesity 10/06/2013  . GERD (gastroesophageal reflux disease) 10/06/2013    Past Surgical History:  Procedure Laterality Date  . THYROID SURGERY          Home Medications    Prior to Admission medications   Medication Sig Start Date End Date Taking? Authorizing Provider  Blood Glucose Monitoring Suppl (TRUE METRIX METER) W/DEVICE KIT 1 each by Does not apply route 3 (three) times daily. 08/25/15   Tresa Garter, MD  gemfibrozil (LOPID) 600 MG tablet Take 1 tablet (600 mg total) by mouth 2 (two) times daily before a meal. 09/06/16 12/05/16  Fredia Beets  R, FNP  glucose blood test strip Use as instructed 11/18/16   Alfonse Spruce, FNP  HYDROcodone-acetaminophen (NORCO/VICODIN) 5-325 MG tablet Take 1 tablet by mouth every 6 (six) hours as needed. 01/26/18   Lorin Glass, PA-C  levothyroxine (SYNTHROID, LEVOTHROID) 75 MCG tablet Take 1 tablet (75 mcg total) by mouth daily. 09/06/16   Alfonse Spruce, FNP  lisinopril (PRINIVIL,ZESTRIL) 10 MG tablet Take 1 tablet (10 mg total) by mouth daily. 11/13/16   Alfonse Spruce, FNP  metFORMIN (GLUCOPHAGE) 500 MG tablet Take 1 tablet (500 mg total) by mouth 2 (two) times daily with a meal. 09/04/16   Hairston, Maylon Peppers, FNP  oxyCODONE-acetaminophen (PERCOCET/ROXICET) 5-325 MG tablet Take 1 tablet by mouth every 4 (four) hours as needed for severe pain. 01/06/18   Hedges, Dellis Filbert, PA-C  tamsulosin (FLOMAX) 0.4 MG CAPS capsule Take 1 capsule (0.4 mg total) by mouth daily. 01/06/18   Okey Regal, PA-C    Family History Family History  Problem Relation Age of Onset  . Diabetes Mellitus II Neg Hx   . CAD Neg Hx     Social History Social History   Tobacco Use  . Smoking status: Never Smoker  . Smokeless tobacco: Never Used  Substance Use Topics  . Alcohol use: No  . Drug use: No     Allergies   Patient has no known allergies.   Review of Systems Review  of Systems  Constitutional: Negative for chills and fever.  Musculoskeletal:       Left elbow pain  Neurological: Negative for weakness and numbness.  All other systems reviewed and are negative.    Physical Exam Updated Vital Signs BP (!) 167/90 (BP Location: Right Arm)   Pulse (!) 58   Temp 98.5 F (36.9 C) (Oral)   Resp 16   SpO2 99%   Physical Exam  Constitutional: He appears well-developed and well-nourished.  HENT:  Head: Normocephalic and atraumatic.  Cardiovascular: Intact distal pulses.  Musculoskeletal:  Slight swelling over left posterior elbow.  There is no obvious redness, induration, or wounds.   Patient has full flexion of left elbow, is able to extend arm to about 170 degrees.  Grip strength intact to left hand.  Neurological:  Sensation intact to bilateral upper extremities.  Skin: Skin is warm and dry. He is not diaphoretic.  Psychiatric: He has a normal mood and affect. His behavior is normal.  Nursing note and vitals reviewed.    ED Treatments / Results  Labs (all labs ordered are listed, but only abnormal results are displayed) Labs Reviewed - No data to display  EKG None  Radiology Dg Elbow Complete Left  Result Date: 01/26/2018 CLINICAL DATA:  Left elbow pain after fall several weeks ago. EXAM: LEFT ELBOW - COMPLETE 3+ VIEW COMPARISON:  None. FINDINGS: No joint effusion is noted. Large bone fragment is seen posterior to the olecranon which is concerning for moderately displaced fracture. Visualized portion of distal humerus and proximal radius are unremarkable. IMPRESSION: Large bone fragment seen is seen posterior to olecranon which may represent moderately displaced fracture. CT scan of the elbow is recommended for further evaluation. Electronically Signed   By: Marijo Conception, M.D.   On: 01/26/2018 18:09   Ct Elbow Left Wo Contrast  Result Date: 01/26/2018 CLINICAL DATA:  Status post fall.  Left elbow pain. EXAM: CT OF THE UPPER LEFT EXTREMITY WITHOUT CONTRAST TECHNIQUE: Multidetector CT imaging of the upper left extremity was performed according to the standard protocol. COMPARISON:  None. FINDINGS: Bones/Joint/Cartilage Acute displaced avulsion fracture of the olecranon with 12 mm of displacement. No other acute fracture or dislocation. Joint spaces are maintained. No joint effusion. No intra-articular loose body. Ligaments Suboptimally assessed by CT. Muscles and Tendons Muscles are normal. No muscle atrophy. Triceps tendon is grossly intact. Biceps tendon is intact. Soft tissues No fluid collection or hematoma. Soft tissue swelling around the avulsion fracture along  the dorsal aspect of the elbow and forearm. IMPRESSION: 1. Acute displaced avulsion fracture of the olecranon with 12 mm of displacement. Electronically Signed   By: Kathreen Devoid   On: 01/26/2018 19:41    Procedures .Splint Application Date/Time: 2/87/6811 1:10 AM Performed by: Lorin Glass, PA-C Authorized by: Lorin Glass, PA-C   Consent:    Consent obtained:  Verbal   Consent given by:  Patient   Risks discussed:  Discoloration, numbness, pain and swelling   Alternatives discussed:  Referral, alternative treatment and no treatment Pre-procedure details:    Sensation:  Normal Procedure details:    Laterality:  Left   Location:  Elbow   Elbow:  L elbow   Splint type:  Long arm   Supplies:  Ortho-Glass Post-procedure details:    Pain:  Improved   Sensation:  Normal   Skin color:  Unchanged   Patient tolerance of procedure:  Tolerated well, no immediate complications Comments:     Splint  was performed by [facility], I have evaluated patient after placement   (including critical care time)   Medications Ordered in ED Medications - No data to display   Initial Impression / Assessment and Plan / ED Course  I have reviewed the triage vital signs and the nursing notes.  Pertinent labs & imaging results that were available during my care of the patient were reviewed by me and considered in my medical decision making (see chart for details).  Clinical Course as of Jan 27 110  Mon Jan 26, 2018  2002 Posterior splint and office follow up this week.   [EH]    Clinical Course User Index [EH] Lorin Glass, PA-C   Patient presents today for evaluation of continued left elbow pain after a mechanical fall 2 weeks ago.  X-rays were obtained with concern for fracture, CT was recommended by radiologist.  CT was obtained showing a displaced avulsion fracture of the left olecranon process by 12 mm.  I spoke with Dr. Alma Friendly on-call for orthopedics who recommended  posterior splint, pain control, and office follow-up later this week.  Splint was placed after which I evaluated the patient and the splint.  Patient was given orthopedics follow-up.  Return precautions were discussed and he states his understanding.  He was given a very short course of Vicodin to help him sleep at night as he reports that the pain and not being able to sleep was why he came in.  While in the emergency room patient's blood pressure was mildly elevated, he was instructed to follow-up with his PCP.  Patient neurovascularly intact after splinting, discharged home.  Final Clinical Impressions(s) / ED Diagnoses   Final diagnoses:  Closed fracture of olecranon process of left ulna, initial encounter    ED Discharge Orders        Ordered    HYDROcodone-acetaminophen (NORCO/VICODIN) 5-325 MG tablet  Every 6 hours PRN     01/26/18 2119       Lorin Glass, PA-C 01/27/18 3818    Blanchie Dessert, MD 01/27/18 2245

## 2018-01-26 NOTE — ED Notes (Signed)
Patient left at this time with all belongings. 

## 2018-01-26 NOTE — Discharge Instructions (Addendum)
Please take Ibuprofen (Advil, motrin) and Tylenol (acetaminophen) to relieve your pain.  You may take up to 600 MG (3 pills) of normal strength ibuprofen every 8 hours as needed.  In between doses of ibuprofen you make take tylenol, up to 1,000 mg (two extra strength pills).  Do not take more than 3,000 mg tylenol in a 24 hour period.  Please check all medication labels as many medications such as pain and cold medications may contain tylenol.  Do not drink alcohol while taking these medications.  Do not take other NSAID'S while taking ibuprofen (such as aleve or naproxen).  Please take ibuprofen with food to decrease stomach upset. You may take aleve (instead of ibuprofen).   While in the ED your blood pressure was high.  Please follow up with your primary care doctor or the wellness clinic for repeat evaluation as you may need medication.  High blood pressure can cause long term, potentially serious, damage if left untreated.

## 2018-01-26 NOTE — ED Notes (Signed)
Ortho tech called for splint.

## 2018-01-26 NOTE — ED Triage Notes (Signed)
Pt presents for evaluation of L elbow pain. States he fell on elbow a few weeks ago and has been hurting since. Full ROM. Elbow is swollen and warm. Hx of gout. No daily meds for gout.

## 2018-01-26 NOTE — Progress Notes (Signed)
Orthopedic Tech Progress Note Patient Details:  Eric Larson 14-Jul-1961 [id number]  Ortho Devices Type of Ortho Device: Post (long arm) splint, Arm sling Ortho Device/Splint Location: lue Ortho Device/Splint Interventions: Ordered, Application, Adjustment   Post Interventions Patient Tolerated: Well Instructions Provided: Care of device, Adjustment of device   [name] 01/26/2018, 10:01 PM

## 2018-12-02 ENCOUNTER — Encounter (HOSPITAL_COMMUNITY): Payer: Self-pay | Admitting: Emergency Medicine

## 2018-12-02 ENCOUNTER — Emergency Department (HOSPITAL_COMMUNITY)
Admission: EM | Admit: 2018-12-02 | Discharge: 2018-12-02 | Disposition: A | Discharge status: Home / Self Care | Payer: Medicaid Other | Attending: Emergency Medicine | Admitting: Emergency Medicine

## 2018-12-02 ENCOUNTER — Other Ambulatory Visit: Payer: Self-pay

## 2018-12-02 DIAGNOSIS — E1122 Type 2 diabetes mellitus with diabetic chronic kidney disease: Secondary | ICD-10-CM | POA: Insufficient documentation

## 2018-12-02 DIAGNOSIS — I129 Hypertensive chronic kidney disease with stage 1 through stage 4 chronic kidney disease, or unspecified chronic kidney disease: Secondary | ICD-10-CM | POA: Insufficient documentation

## 2018-12-02 DIAGNOSIS — Z7984 Long term (current) use of oral hypoglycemic drugs: Secondary | ICD-10-CM | POA: Insufficient documentation

## 2018-12-02 DIAGNOSIS — I10 Essential (primary) hypertension: Secondary | ICD-10-CM

## 2018-12-02 DIAGNOSIS — N189 Chronic kidney disease, unspecified: Secondary | ICD-10-CM | POA: Insufficient documentation

## 2018-12-02 DIAGNOSIS — B9789 Other viral agents as the cause of diseases classified elsewhere: Secondary | ICD-10-CM

## 2018-12-02 DIAGNOSIS — J988 Other specified respiratory disorders: Secondary | ICD-10-CM | POA: Insufficient documentation

## 2018-12-02 MED ORDER — BENZONATATE 100 MG PO CAPS: 100.0000 mg | ORAL_CAPSULE | Freq: Three times a day (TID) | ORAL | 0 refills | Status: DC

## 2018-12-02 NOTE — ED Notes (Signed)
Patient verbalizes understanding of discharge instructions. Opportunity for questioning and answers were provided. Armband removed by staff, pt discharged from ED. Pt ambulatory to lobby. Work note and prescriptions/pharmacy reviewed.

## 2018-12-02 NOTE — ED Provider Notes (Signed)
[facility] EMERGENCY DEPARTMENT Provider Note   CSN: [id number] Arrival date & time: 12/02/18  1050    History   Chief Complaint Chief Complaint  Patient presents with  . Sore Throat  . Cough    HPI Eric Larson is a 58 y.o. male.      HPI    58 year old male with a significant past medical history of diabetes, hypertension presents today with complaints of cough and sore throat.  He notes symptoms started yesterday with minor sore throat nonproductive cough.  Patient denies any shortness of breath, chest pain, fever, notes a very minimal rhinorrhea nasal congestion.  He denies any pain with swallowing or difficulty swallowing.  He denies any close sick contacts or recent travel, no known coronavirus contacts.  He took NyQuil last night no medications today.  He did not take his blood pressure medication today.  He denies any smoking or pulmonary history.  Past Medical History:  Diagnosis Date  . Acid reflux   . Diabetes mellitus without complication ([place])   . Gout     Patient Active Problem List   Diagnosis Date Noted  . Diabetes mellitus ([place]) 10/22/2013  . Hypothyroidism 10/08/2013  . AKI (acute kidney injury) ([place]) 10/07/2013  . Hyperglycemia without ketosis 10/06/2013  . DKA (diabetic ketoacidoses) ([place]) 10/06/2013  . Elevated blood pressure 10/06/2013  . History of gout 10/06/2013  . History of goiter 10/06/2013  . Type 2 diabetes mellitus with hyperosmolar nonketotic hyperglycemia ([place]) 10/06/2013  . CKD (chronic kidney disease) 10/06/2013  . Obesity 10/06/2013  . GERD (gastroesophageal reflux disease) 10/06/2013    Past Surgical History:  Procedure Laterality Date  . THYROID SURGERY          Home Medications    Prior to Admission medications   Medication Sig Start Date End Date Taking? Authorizing Provider  benzonatate (TESSALON) 100 MG capsule Take 1 capsule (100 mg total) by mouth every 8 (eight) hours. 12/02/18   Yasha Tibbett,  Dellis Filbert, PA-C  Blood Glucose Monitoring Suppl (TRUE METRIX METER) W/DEVICE KIT 1 each by Does not apply route 3 (three) times daily. 08/25/15   Tresa Garter, MD  gemfibrozil (LOPID) 600 MG tablet Take 1 tablet (600 mg total) by mouth 2 (two) times daily before a meal. 09/06/16 12/05/16  Fredia Beets R, FNP  glucose blood test strip Use as instructed 11/18/16   Alfonse Spruce, FNP  HYDROcodone-acetaminophen (NORCO/VICODIN) 5-325 MG tablet Take 1 tablet by mouth every 6 (six) hours as needed. 01/26/18   Lorin Glass, PA-C  levothyroxine (SYNTHROID, LEVOTHROID) 75 MCG tablet Take 1 tablet (75 mcg total) by mouth daily. 09/06/16   Alfonse Spruce, FNP  lisinopril (PRINIVIL,ZESTRIL) 10 MG tablet Take 1 tablet (10 mg total) by mouth daily. 11/13/16   Alfonse Spruce, FNP  metFORMIN (GLUCOPHAGE) 500 MG tablet Take 1 tablet (500 mg total) by mouth 2 (two) times daily with a meal. 09/04/16   Hairston, Maylon Peppers, FNP  oxyCODONE-acetaminophen (PERCOCET/ROXICET) 5-325 MG tablet Take 1 tablet by mouth every 4 (four) hours as needed for severe pain. 01/06/18   Nelly Scriven, Dellis Filbert, PA-C  tamsulosin (FLOMAX) 0.4 MG CAPS capsule Take 1 capsule (0.4 mg total) by mouth daily. 01/06/18   Okey Regal, PA-C    Family History Family History  Problem Relation Age of Onset  . Diabetes Mellitus II Neg Hx   . CAD Neg Hx     Social History Social History   Tobacco Use  . Smoking  status: Never Smoker  . Smokeless tobacco: Never Used  Substance Use Topics  . Alcohol use: No  . Drug use: No     Allergies   Patient has no known allergies.   Review of Systems Review of Systems  All other systems reviewed and are negative.    Physical Exam Updated Vital Signs BP (!) 175/104 (BP Location: Right Arm)   Pulse 70   Temp 98.6 F (37 C) (Oral)   Resp 17   Ht 6' (1.829 m)   Wt 99.8 kg   SpO2 98%   BMI 29.84 kg/m   Physical Exam Vitals signs and nursing note reviewed.   Constitutional:      Appearance: He is well-developed.  HENT:     Head: Normocephalic and atraumatic.     Comments: Oropharynx clear no erythema, edema or exudate, no rhinorrhea noted Eyes:     General: No scleral icterus.       Right eye: No discharge.        Left eye: No discharge.     Conjunctiva/sclera: Conjunctivae normal.     Pupils: Pupils are equal, round, and reactive to light.  Neck:     Musculoskeletal: Normal range of motion.     Vascular: No JVD.     Trachea: No tracheal deviation.  Pulmonary:     Effort: Pulmonary effort is normal. No respiratory distress.     Breath sounds: Normal breath sounds. No stridor. No wheezing, rhonchi or rales.     Comments: No cough noted Neurological:     Mental Status: He is alert and oriented to person, place, and time.     Coordination: Coordination normal.  Psychiatric:        Behavior: Behavior normal.        Thought Content: Thought content normal.        Judgment: Judgment normal.      ED Treatments / Results  Labs (all labs ordered are listed, but only abnormal results are displayed) Labs Reviewed - No data to display  EKG None  Radiology No results found.  Procedures Procedures (including critical care time)  Medications Ordered in ED Medications - No data to display   Initial Impression / Assessment and Plan / ED Course  I have reviewed the triage vital signs and the nursing notes.  Pertinent labs & imaging results that were available during my care of the patient were reviewed by me and considered in my medical decision making (see chart for details).        58 year old male presents today with likely viral illness.  He has no signs of severe infection.  Consideration for influenza and coronavirus taken but given his very minimal symptoms lower suspicion.  Patient discharged with symptomatic care and strict return precaution.  He verbalized understanding and agreement to today's plan.. Patient will  continue taking antihypertensive medication.  He is asymptomatic from this at this time.  Eric Larson was evaluated in Emergency Department on 12/02/2018 for the symptoms described in the history of present illness. He was evaluated in the context of the global COVID-19 pandemic, which necessitated consideration that the patient might be at risk for infection with the SARS-CoV-2 virus that causes COVID-19. Institutional protocols and algorithms that pertain to the evaluation of patients at risk for COVID-19 are in a state of rapid change based on information released by regulatory bodies including the CDC and federal and state organizations. These policies and algorithms were followed during the patient's care in  the ED.    Final Clinical Impressions(s) / ED Diagnoses   Final diagnoses:  Viral respiratory illness  Hypertension, unspecified type    ED Discharge Orders         Ordered    benzonatate (TESSALON) 100 MG capsule  Every 8 hours     12/02/18 1108           Okey Regal, PA-C 12/02/18 1109    Blanchie Dessert, MD 12/02/18 [phone]

## 2018-12-02 NOTE — Discharge Instructions (Signed)
Please read attached information. If you experience any new or worsening signs or symptoms please return to the emergency room for evaluation. Please follow-up with your primary care provider or specialist as discussed. Please use medication prescribed only as directed and discontinue taking if you have any concerning signs or symptoms.   °

## 2018-12-02 NOTE — ED Triage Notes (Signed)
Pt. Stated, I HAVE A SORE THROAT and cough that started yesterday

## 2019-11-18 IMAGING — CT CT RENAL STONE PROTOCOL
2 of 4 series · 16 of 46 positions shown, 18 images · non-contrast
Comparison: November 23, 2016

CLINICAL DATA: Right-sided flank pain.  Nausea vomiting.

EXAM:
CT ABDOMEN AND PELVIS WITHOUT CONTRAST
TECHNIQUE: Multidetector CT imaging of the abdomen and pelvis was performed
following the standard protocol without IV contrast.

[Series 3: stone study 5.0 i30f 2 · axial · 0.89mm/px · z∈[+1034,+1518]mm · 13 of 107 slices shown, 15 images]
[im 5/107  soft-tissue]
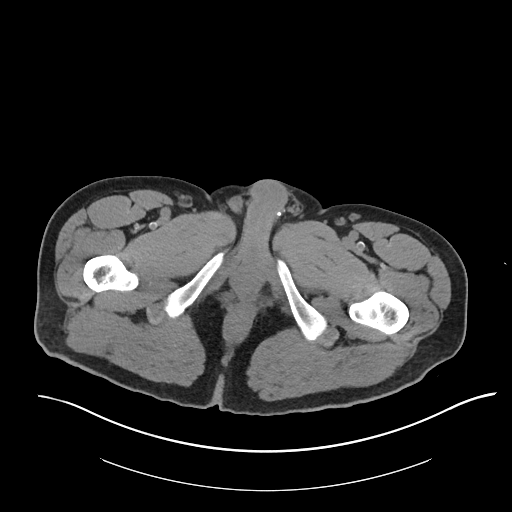
[im 5/107  bone]
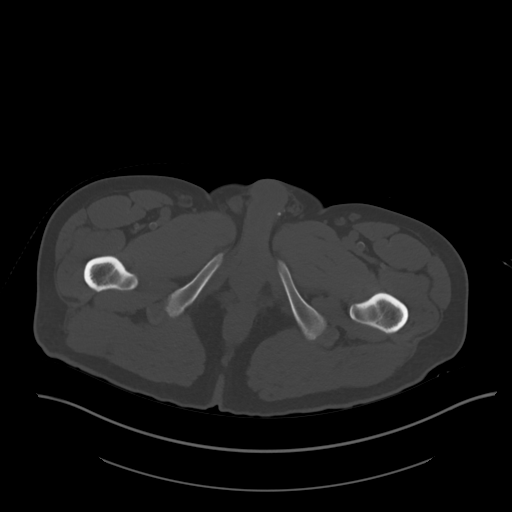
[im 13/107  soft-tissue]
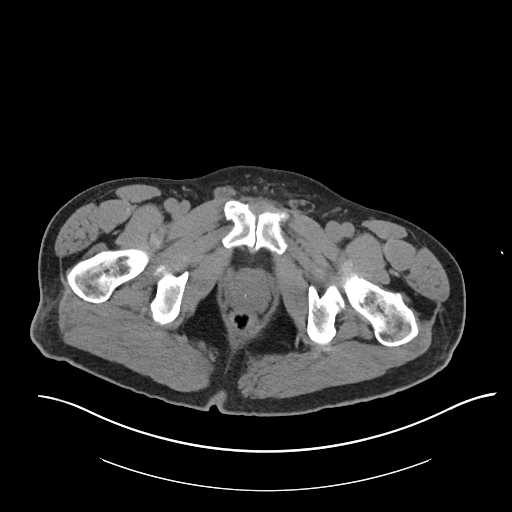
[im 21/107  soft-tissue]
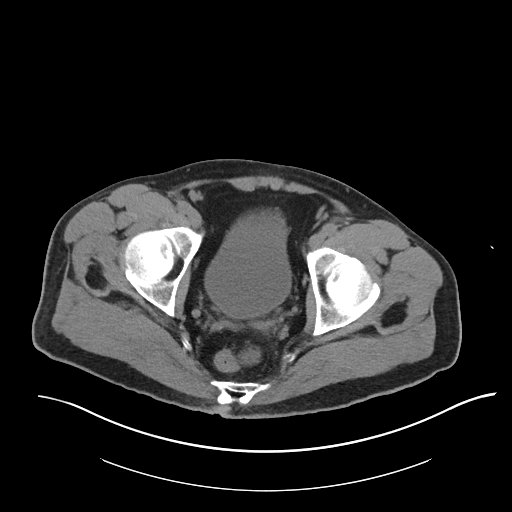
[im 29/107  soft-tissue]
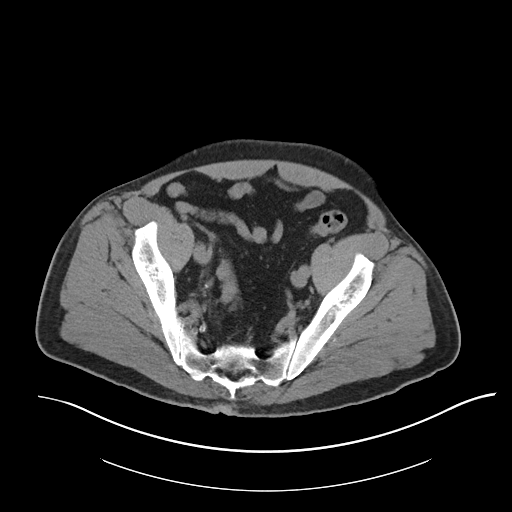
[im 37/107  soft-tissue]
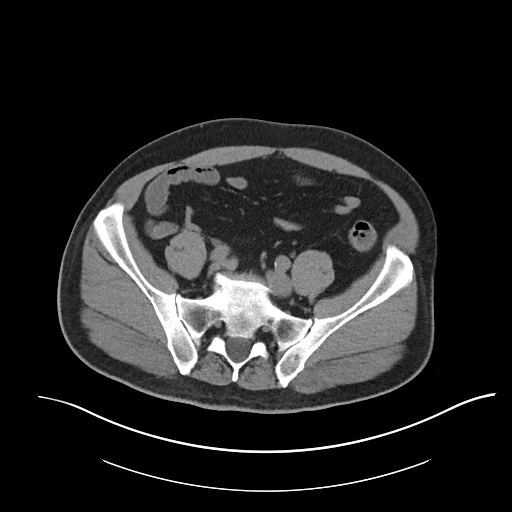
[im 45/107  soft-tissue]
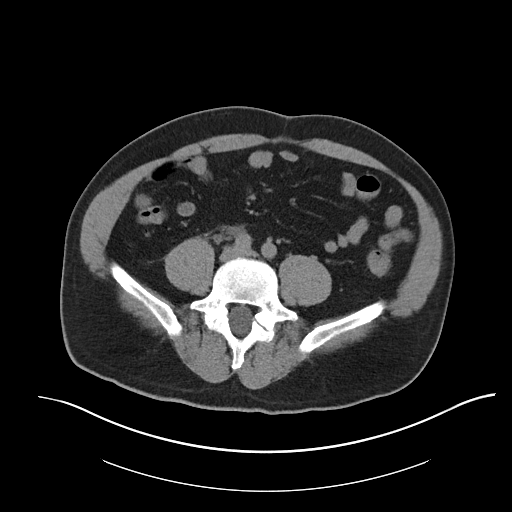
[im 54/107  soft-tissue]
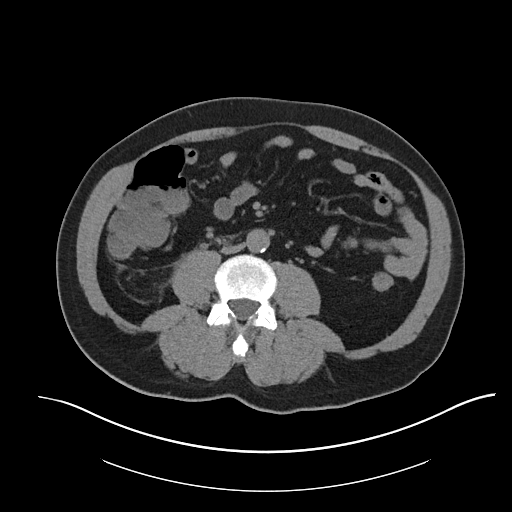
[im 62/107  soft-tissue]
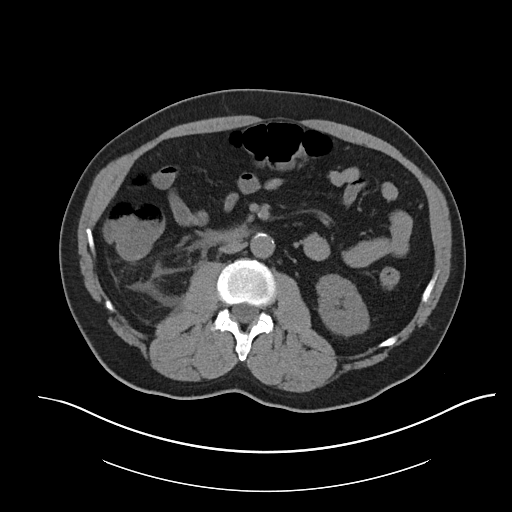
[im 70/107  soft-tissue]
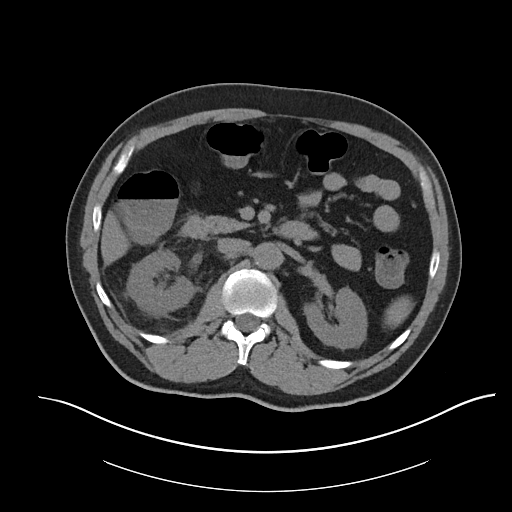
[im 70/107  bone]
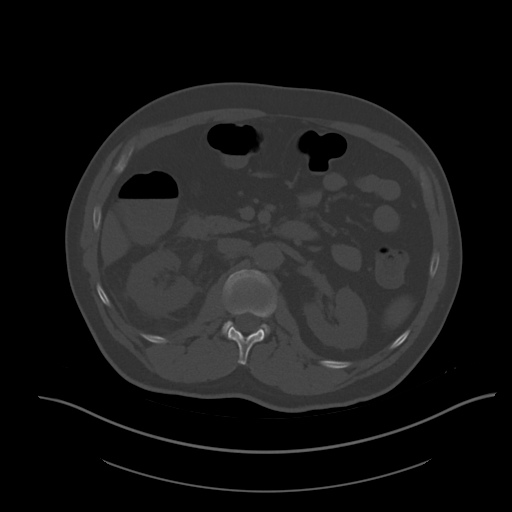
[im 78/107  soft-tissue]
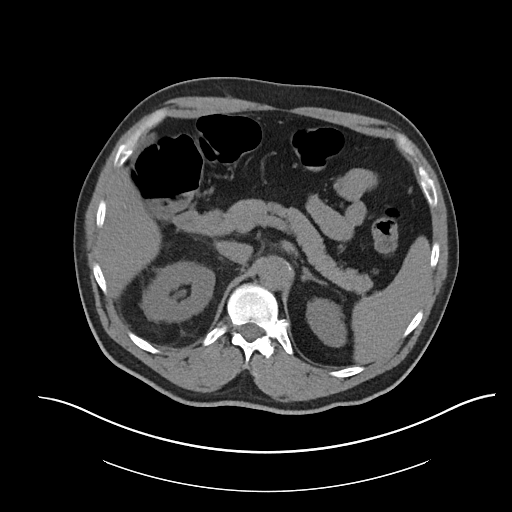
[im 86/107  soft-tissue]
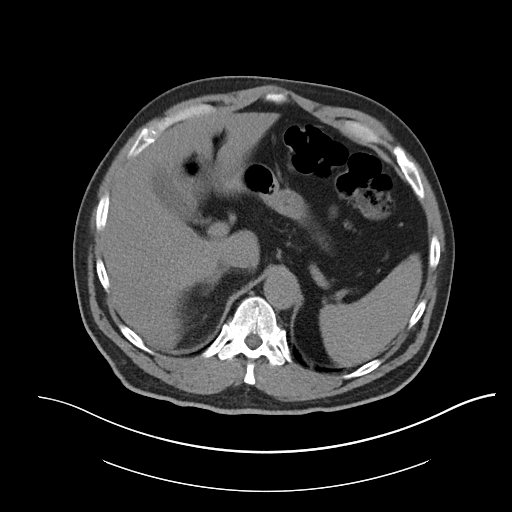
[im 94/107  soft-tissue]
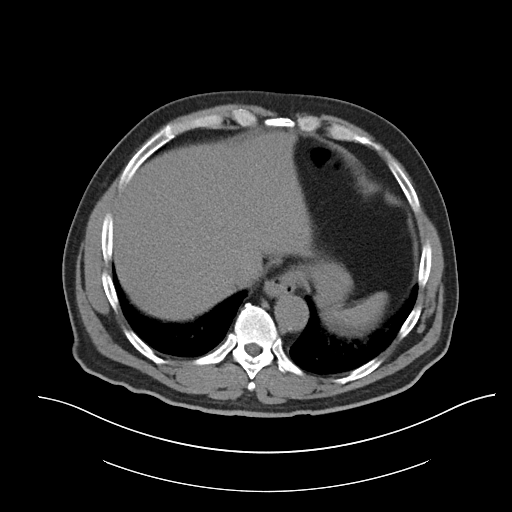
[im 102/107  soft-tissue]
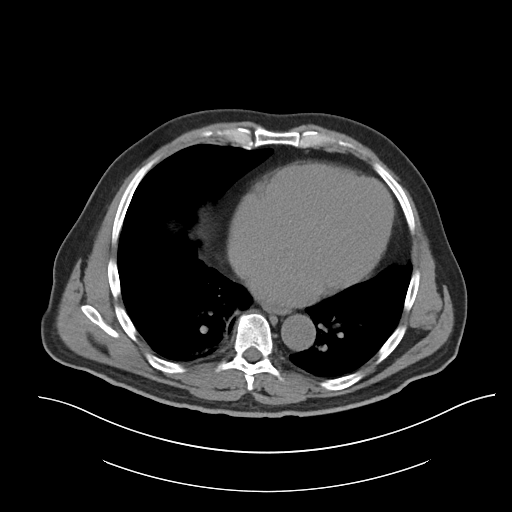

[Series 6: coronal soft tissue · coronal · 0.80mm/px · 3 of 111 slices shown]
[im 37/111  soft-tissue]
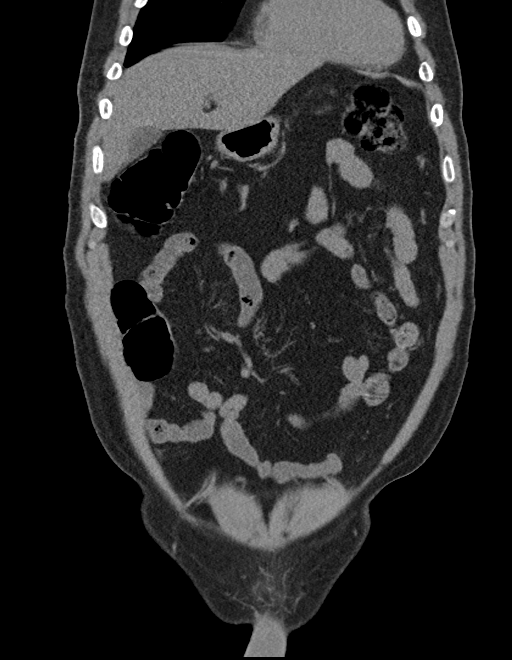
[im 49/111  soft-tissue]
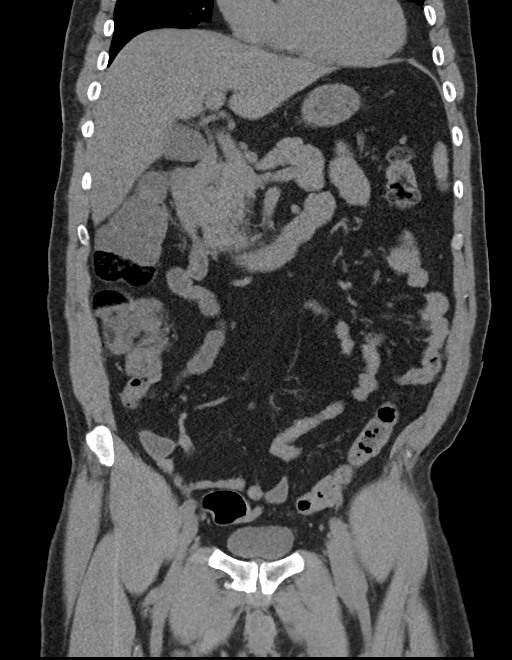
[im 62/111  soft-tissue]
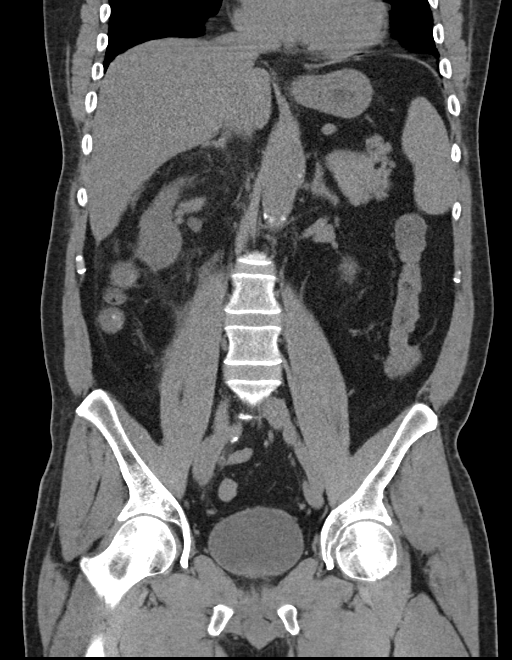

[16 of 46 positions shown; findings below may reference images not displayed]

FINDINGS: Lower chest: Mild dependent atelectasis. Cardiomegaly. No other
abnormalities in the lung bases.

Hepatobiliary: There is a cyst in the right hepatic lobe. The liver
and gallbladder are otherwise normal.

Pancreas: Low-attenuation the pancreatic tail on axial image 28 is
consistent with a small the positive fat of no significance. This is
a stable finding. The pancreas is otherwise normal.

Spleen: Normal in size without focal abnormality.

Adrenals/Urinary Tract: Small bilateral renal stones. Mild
hydronephrosis and moderate perinephric stranding on the right.
Right ureterectasis due to a distal right ureteral stone measuring 4
mm. No other abnormalities in the kidneys, ureters, or bladder.
Adrenal glands are normal.

Stomach/Bowel: The distal esophagus, stomach, and small bowel are
normal. The colon is unremarkable. The appendix is normal.

Vascular/Lymphatic: Mild atherosclerosis in the nonaneurysmal aorta.
Atherosclerotic changes extend into the iliac and femoral vessels.
No adenopathy.

Reproductive: Prostate is unremarkable.

Other: No free air or free fluid.

Musculoskeletal: No acute or significant osseous findings.
IMPRESSION: 1. There is a 4 mm stone in the distal right ureter resulting in
mild hydronephrosis, mild ureterectasis, and moderate right
perinephric stranding. This is consistent with the patient's
symptoms.
2. Multiple small stones in both kidneys.
3. Atherosclerosis in the abdominal aorta.

## 2021-02-08 ENCOUNTER — Other Ambulatory Visit: Payer: Self-pay

## 2021-02-08 ENCOUNTER — Emergency Department (HOSPITAL_COMMUNITY)
Admission: EM | Admit: 2021-02-08 | Discharge: 2021-02-08 | Disposition: A | Discharge status: Home / Self Care | Payer: Self-pay | Attending: Emergency Medicine | Admitting: Emergency Medicine

## 2021-02-08 ENCOUNTER — Encounter (HOSPITAL_COMMUNITY): Payer: Self-pay | Admitting: Emergency Medicine

## 2021-02-08 DIAGNOSIS — E039 Hypothyroidism, unspecified: Secondary | ICD-10-CM | POA: Insufficient documentation

## 2021-02-08 DIAGNOSIS — N189 Chronic kidney disease, unspecified: Secondary | ICD-10-CM | POA: Insufficient documentation

## 2021-02-08 DIAGNOSIS — J029 Acute pharyngitis, unspecified: Secondary | ICD-10-CM | POA: Insufficient documentation

## 2021-02-08 DIAGNOSIS — Z79899 Other long term (current) drug therapy: Secondary | ICD-10-CM | POA: Insufficient documentation

## 2021-02-08 DIAGNOSIS — Z20822 Contact with and (suspected) exposure to covid-19: Secondary | ICD-10-CM | POA: Insufficient documentation

## 2021-02-08 DIAGNOSIS — E1122 Type 2 diabetes mellitus with diabetic chronic kidney disease: Secondary | ICD-10-CM | POA: Insufficient documentation

## 2021-02-08 DIAGNOSIS — Z7984 Long term (current) use of oral hypoglycemic drugs: Secondary | ICD-10-CM | POA: Insufficient documentation

## 2021-02-08 DIAGNOSIS — J020 Streptococcal pharyngitis: Secondary | ICD-10-CM

## 2021-02-08 DIAGNOSIS — Z28310 Unvaccinated for covid-19: Secondary | ICD-10-CM | POA: Insufficient documentation

## 2021-02-08 LAB — GROUP A STREP BY PCR: Group A Strep by PCR: DETECTED — AB

## 2021-02-08 LAB — SARS CORONAVIRUS 2 (TAT 6-24 HRS): SARS Coronavirus 2: NEGATIVE

## 2021-02-08 MED ORDER — AMOXICILLIN 500 MG PO CAPS: 500.0000 mg | ORAL_CAPSULE | Freq: Two times a day (BID) | ORAL | 0 refills | Status: AC

## 2021-02-08 NOTE — Discharge Instructions (Addendum)
Your results will be available in your MyChart account in the next 24 hours. If your strep test is positive, [facility] scription [facility] [address]. If your COVID test is positive, contact your PCP to discuss antiviral therapy.

## 2021-02-08 NOTE — ED Provider Notes (Signed)
[facility] EMERGENCY DEPARTMENT Provider Note   CSN: [id number] Arrival date & time: 02/08/21  1137     History Chief Complaint  Patient presents with   Sore Throat    Eric Larson is a 60 y.o. male.  60 year old male with history of diabetes presents with complaint of sore throat onset this morning upon waking.  Denies associated cough, congestion, postnasal drip, fevers, headache, nausea, vomiting, body aches.  No known exposure to anyone who has tested positive for COVID, patient is not vaccinated against COVID.  Sore throat is worse with swallowing however is able to tolerate secretions and eat and drink without difficulty.  Patient states he is here to be tested for COVID.      Past Medical History:  Diagnosis Date   Acid reflux    Diabetes mellitus without complication ([place])    Gout     Patient Active Problem List   Diagnosis Date Noted   Diabetes mellitus ([place]) 10/22/2013   Hypothyroidism 10/08/2013   AKI (acute kidney injury) ([place]) 10/07/2013   Hyperglycemia without ketosis 10/06/2013   DKA (diabetic ketoacidoses) 10/06/2013   Elevated blood pressure 10/06/2013   History of gout 10/06/2013   History of goiter 10/06/2013   Type 2 diabetes mellitus with hyperosmolar nonketotic hyperglycemia ([place]) 10/06/2013   CKD (chronic kidney disease) 10/06/2013   Obesity 10/06/2013   GERD (gastroesophageal reflux disease) 10/06/2013    Past Surgical History:  Procedure Laterality Date   THYROID SURGERY         Family History  Problem Relation Age of Onset   Diabetes Mellitus II Neg Hx    CAD Neg Hx     Social History   Tobacco Use   Smoking status: Never   Smokeless tobacco: Never  Substance Use Topics   Alcohol use: No   Drug use: No    Home Medications Prior to Admission medications   Medication Sig Start Date End Date Taking? Authorizing Provider  amoxicillin (AMOXIL) 500 MG capsule Take 1 capsule (500 mg total) by mouth 2 (two)  times daily for 10 days. 02/08/21 02/18/21 Yes Tacy Learn, PA-C  benzonatate (TESSALON) 100 MG capsule Take 1 capsule (100 mg total) by mouth every 8 (eight) hours. 12/02/18   Hedges, Dellis Filbert, PA-C  Blood Glucose Monitoring Suppl (TRUE METRIX METER) W/DEVICE KIT 1 each by Does not apply route 3 (three) times daily. 08/25/15   Tresa Garter, MD  gemfibrozil (LOPID) 600 MG tablet Take 1 tablet (600 mg total) by mouth 2 (two) times daily before a meal. 09/06/16 12/05/16  Fredia Beets R, FNP  glucose blood test strip Use as instructed 11/18/16   Alfonse Spruce, FNP  HYDROcodone-acetaminophen (NORCO/VICODIN) 5-325 MG tablet Take 1 tablet by mouth every 6 (six) hours as needed. 01/26/18   Lorin Glass, PA-C  levothyroxine (SYNTHROID, LEVOTHROID) 75 MCG tablet Take 1 tablet (75 mcg total) by mouth daily. 09/06/16   Alfonse Spruce, FNP  lisinopril (PRINIVIL,ZESTRIL) 10 MG tablet Take 1 tablet (10 mg total) by mouth daily. 11/13/16   Alfonse Spruce, FNP  metFORMIN (GLUCOPHAGE) 500 MG tablet Take 1 tablet (500 mg total) by mouth 2 (two) times daily with a meal. 09/04/16   Hairston, Maylon Peppers, FNP  oxyCODONE-acetaminophen (PERCOCET/ROXICET) 5-325 MG tablet Take 1 tablet by mouth every 4 (four) hours as needed for severe pain. 01/06/18   Hedges, Dellis Filbert, PA-C  tamsulosin (FLOMAX) 0.4 MG CAPS capsule Take 1 capsule (0.4 mg total) by mouth  daily. 01/06/18   Okey Regal, PA-C    Allergies    Patient has no known allergies.  Review of Systems   Review of Systems  Constitutional:  Negative for chills, diaphoresis and fever.  HENT:  Positive for sore throat. Negative for congestion, postnasal drip, rhinorrhea, sinus pressure, sinus pain, trouble swallowing and voice change.   Respiratory:  Negative for cough and shortness of breath.   Gastrointestinal:  Negative for diarrhea, nausea and vomiting.  Musculoskeletal:  Negative for arthralgias, myalgias, neck pain and neck stiffness.   Allergic/Immunologic: Positive for immunocompromised state.  Neurological:  Negative for headaches.  Hematological:  Negative for adenopathy.  Psychiatric/Behavioral:  Negative for confusion.   All other systems reviewed and are negative.  Physical Exam Updated Vital Signs BP (!) 155/99   Pulse 89   Temp 98.8 F (37.1 C)   Resp 14   SpO2 97%   Physical Exam Vitals and nursing note reviewed.  Constitutional:      General: He is not in acute distress.    Appearance: He is well-developed. He is not diaphoretic.  HENT:     Head: Normocephalic and atraumatic.     Nose: No congestion.     Mouth/Throat:     Mouth: Mucous membranes are moist.     Pharynx: Uvula midline. Posterior oropharyngeal erythema present. No pharyngeal swelling, oropharyngeal exudate or uvula swelling.     Tonsils: No tonsillar exudate or tonsillar abscesses. 1+ on the right. 1+ on the left.  Eyes:     Conjunctiva/sclera: Conjunctivae normal.  Cardiovascular:     Rate and Rhythm: Normal rate and regular rhythm.  Pulmonary:     Effort: Pulmonary effort is normal.     Breath sounds: Normal breath sounds.  Musculoskeletal:     Cervical back: Neck supple.  Lymphadenopathy:     Cervical: No cervical adenopathy.  Neurological:     Mental Status: He is alert and oriented to person, place, and time.  Psychiatric:        Behavior: Behavior normal.    ED Results / Procedures / Treatments   Labs (all labs ordered are listed, but only abnormal results are displayed) Labs Reviewed  GROUP A STREP BY PCR - Abnormal; Notable for the following components:      Result Value   Group A Strep by PCR DETECTED (*)    All other components within normal limits  SARS CORONAVIRUS 2 (TAT 6-24 HRS)    EKG None  Radiology No results found.  Procedures Procedures   Medications Ordered in ED Medications - No data to display  ED Course  I have reviewed the triage vital signs and the nursing notes.  Pertinent labs  & imaging results that were available during my care of the patient were reviewed by me and considered in my medical decision making (see chart for details).  Clinical Course as of 02/08/21 1449  Thu Feb 08, 5030  2360 60 year old male with complaint of sore throat requesting COVID test.  On exam, found to have mild erythema of the oropharynx, uvula is midline, no exudate.  No cervical lymphadenopathy.  Heart and lung exam unremarkable.  Vitals reviewed, mildly hypertensive otherwise unremarkable. And is to test for strep and COVID, advised if strep positive we will send antibiotics to his pharmacy.  If COVID-positive patient follow-up PCP for further management. [LM]  2831 Patient's rapid strep is positive, [facility] and patient was contacted with these test  results. [LM]    Clinical Course User Index [LM] Roque Lias   MDM Rules/Calculators/A&P                           Final Clinical Impression(s) / ED Diagnoses Final diagnoses:  Pharyngitis, unspecified etiology  Strep throat    Rx / DC Orders ED Discharge Orders          Ordered    amoxicillin (AMOXIL) 500 MG capsule  2 times daily        02/08/21 1448             Tacy Learn, PA-C 02/08/21 1449    Davonna Belling, MD 02/08/21 2000

## 2021-02-08 NOTE — ED Triage Notes (Signed)
Patient here for evaluation of sore throat that started this morning, denies any other symptoms. Patient alert, oriented, and in no apparent distress at this time.

## 2021-02-08 NOTE — ED Notes (Signed)
Patient Alert and oriented to baseline. Stable and ambulatory to baseline. Patient verbalized understanding of the discharge instructions.  Patient belongings were taken by the patient.   

## 2021-07-16 ENCOUNTER — Emergency Department (HOSPITAL_COMMUNITY): Payer: Self-pay

## 2021-07-16 ENCOUNTER — Emergency Department (HOSPITAL_COMMUNITY)
Admission: EM | Admit: 2021-07-16 | Discharge: 2021-07-17 | Disposition: A | Discharge status: Home / Self Care | Payer: Self-pay | Attending: Emergency Medicine | Admitting: Emergency Medicine

## 2021-07-16 DIAGNOSIS — M79602 Pain in left arm: Secondary | ICD-10-CM | POA: Insufficient documentation

## 2021-07-16 DIAGNOSIS — Z5321 Procedure and treatment not carried out due to patient leaving prior to being seen by health care provider: Secondary | ICD-10-CM | POA: Insufficient documentation

## 2021-07-16 DIAGNOSIS — H538 Other visual disturbances: Secondary | ICD-10-CM | POA: Insufficient documentation

## 2021-07-16 LAB — BASIC METABOLIC PANEL
Anion gap: 10 (ref 5–15)
BUN: 18 mg/dL (ref 8–23)
CO2: 23 mmol/L (ref 22–32)
Calcium: 9.4 mg/dL (ref 8.9–10.3)
Chloride: 103 mmol/L (ref 98–111)
Creatinine, Ser: 1.24 mg/dL (ref 0.61–1.24)
GFR, Estimated: >60 mL/min (ref >60)
Glucose, Bld: 135 mg/dL — ABNORMAL HIGH (ref 70–99)
Potassium: 3.8 mmol/L (ref 3.5–5.1)
Sodium: 136 mmol/L (ref 135–145)

## 2021-07-16 LAB — CBC WITH DIFFERENTIAL/PLATELET
Abs Immature Granulocytes: 0.03 10*3/uL (ref 0.00–0.07)
Basophils Absolute: 0 10*3/uL (ref 0.0–0.1)
Basophils Relative: 1 %
Eosinophils Absolute: 0.1 10*3/uL (ref 0.0–0.5)
Eosinophils Relative: 2 %
HCT: 41.5 % (ref 39.0–52.0)
Hemoglobin: 14.7 g/dL (ref 13.0–17.0)
Immature Granulocytes: 0 %
Lymphocytes Relative: 36 %
Lymphs Abs: 2.6 10*3/uL (ref 0.7–4.0)
MCH: 30.8 pg (ref 26.0–34.0)
MCHC: 35.4 g/dL (ref 30.0–36.0)
MCV: 86.8 fL (ref 80.0–100.0)
Monocytes Absolute: 0.6 10*3/uL (ref 0.1–1.0)
Monocytes Relative: 8 %
Neutro Abs: 3.9 10*3/uL (ref 1.7–7.7)
Neutrophils Relative %: 53 %
Platelets: 164 10*3/uL (ref 150–400)
RBC: 4.78 MIL/uL (ref 4.22–5.81)
RDW: 13.4 % (ref 11.5–15.5)
WBC: 7.3 10*3/uL (ref 4.0–10.5)
nRBC: 0 % (ref 0.0–0.2)

## 2021-07-16 LAB — CBG MONITORING, ED: Glucose-Capillary: 137 mg/dL — ABNORMAL HIGH (ref 70–99)

## 2021-07-16 NOTE — ED Triage Notes (Signed)
Pt here POV with c/o of left arm pain X1 week. Pt woke up today 0700 and could not see out of left eye. Can see outline of things only.

## 2021-07-16 NOTE — ED Provider Notes (Signed)
Emergency Medicine Provider Triage Evaluation Note  Romello Hoehn , a 60 y.o. male  was evaluated in triage.  Pt complains of left arm pain x1 week, and abrupt onset of left eye blurriness starting at 7 AM.  Patient states that he was seen at his primary doctor for this left arm pain, and was given a muscle relaxer with little to no relief.  There was no injury.  He states that ever since he woke up this morning at 7 AM the vision in his left eye has been blurry, there has been no change in this since this morning.  Review of Systems  Positive: Vision change, left arm pain Negative: Chest pain, shortness of breath, headache, numbness, tingling  Physical Exam  There were no vitals taken for this visit. Gen:   Awake, no distress   Resp:  Normal effort  MSK:   Moves extremities without difficulty  Other:  5/5 strength in all extremities, PERRLA, EOMI, decreased visual acuity on left eye, sensation intact in all extremities  Medical Decision Making  Medically screening exam initiated at 3:53 PM.  Appropriate orders placed.  Hommer Cunliffe was informed that the remainder of the evaluation will be completed by another provider, this initial triage assessment does not replace that evaluation, and the importance of remaining in the ED until their evaluation is complete.     Jeanella Flattery 07/16/21 1554    Milagros Loll, MD 07/17/21 2302

## 2021-07-17 NOTE — ED Notes (Signed)
Patient states he will try to come back later on today

## 2021-12-13 ENCOUNTER — Emergency Department (HOSPITAL_COMMUNITY)
Admission: EM | Admit: 2021-12-13 | Discharge: 2021-12-13 | Disposition: A | Discharge status: Home / Self Care | Payer: Medicaid Other | Attending: Emergency Medicine | Admitting: Emergency Medicine

## 2021-12-13 ENCOUNTER — Encounter (HOSPITAL_COMMUNITY): Payer: Self-pay

## 2021-12-13 ENCOUNTER — Other Ambulatory Visit: Payer: Self-pay

## 2021-12-13 ENCOUNTER — Emergency Department (HOSPITAL_COMMUNITY): Payer: Medicaid Other

## 2021-12-13 DIAGNOSIS — Z5321 Procedure and treatment not carried out due to patient leaving prior to being seen by health care provider: Secondary | ICD-10-CM | POA: Insufficient documentation

## 2021-12-13 DIAGNOSIS — R9431 Abnormal electrocardiogram [ECG] [EKG]: Secondary | ICD-10-CM | POA: Diagnosis not present

## 2021-12-13 DIAGNOSIS — I509 Heart failure, unspecified: Secondary | ICD-10-CM | POA: Diagnosis not present

## 2021-12-13 DIAGNOSIS — Z7984 Long term (current) use of oral hypoglycemic drugs: Secondary | ICD-10-CM | POA: Diagnosis not present

## 2021-12-13 DIAGNOSIS — R778 Other specified abnormalities of plasma proteins: Secondary | ICD-10-CM | POA: Diagnosis not present

## 2021-12-13 DIAGNOSIS — R0601 Orthopnea: Secondary | ICD-10-CM | POA: Diagnosis not present

## 2021-12-13 DIAGNOSIS — R0602 Shortness of breath: Secondary | ICD-10-CM | POA: Insufficient documentation

## 2021-12-13 DIAGNOSIS — E119 Type 2 diabetes mellitus without complications: Secondary | ICD-10-CM | POA: Diagnosis not present

## 2021-12-13 LAB — CBC WITH DIFFERENTIAL/PLATELET
Abs Immature Granulocytes: 0.01 10*3/uL (ref 0.00–0.07)
Basophils Absolute: 0 10*3/uL (ref 0.0–0.1)
Basophils Relative: 1 %
Eosinophils Absolute: 0.1 10*3/uL (ref 0.0–0.5)
Eosinophils Relative: 3 %
HCT: 38.6 % — ABNORMAL LOW (ref 39.0–52.0)
Hemoglobin: 12.9 g/dL — ABNORMAL LOW (ref 13.0–17.0)
Immature Granulocytes: 0 %
Lymphocytes Relative: 46 %
Lymphs Abs: 1.4 10*3/uL (ref 0.7–4.0)
MCH: 31.1 pg (ref 26.0–34.0)
MCHC: 33.4 g/dL (ref 30.0–36.0)
MCV: 93 fL (ref 80.0–100.0)
Monocytes Absolute: 0.3 10*3/uL (ref 0.1–1.0)
Monocytes Relative: 9 %
Neutro Abs: 1.3 10*3/uL — ABNORMAL LOW (ref 1.7–7.7)
Neutrophils Relative %: 41 %
Platelets: 123 10*3/uL — ABNORMAL LOW (ref 150–400)
RBC: 4.15 MIL/uL — ABNORMAL LOW (ref 4.22–5.81)
RDW: 14.9 % (ref 11.5–15.5)
WBC: 3.1 10*3/uL — ABNORMAL LOW (ref 4.0–10.5)
nRBC: 0 % (ref 0.0–0.2)

## 2021-12-13 LAB — BASIC METABOLIC PANEL
Anion gap: 10 (ref 5–15)
BUN: 17 mg/dL (ref 8–23)
CO2: 18 mmol/L — ABNORMAL LOW (ref 22–32)
Calcium: 9 mg/dL (ref 8.9–10.3)
Chloride: 110 mmol/L (ref 98–111)
Creatinine, Ser: 1.52 mg/dL — ABNORMAL HIGH (ref 0.61–1.24)
GFR, Estimated: 52 mL/min — ABNORMAL LOW (ref >60)
Glucose, Bld: 178 mg/dL — ABNORMAL HIGH (ref 70–99)
Potassium: 4.3 mmol/L (ref 3.5–5.1)
Sodium: 138 mmol/L (ref 135–145)

## 2021-12-13 LAB — BRAIN NATRIURETIC PEPTIDE: B Natriuretic Peptide: 1224.5 pg/mL — ABNORMAL HIGH (ref 0.0–100.0)

## 2021-12-13 NOTE — ED Notes (Signed)
Call patient x4 no answer for recheck vitals ?

## 2021-12-13 NOTE — ED Provider Triage Note (Signed)
Emergency Medicine Provider Triage Evaluation Note ? ?Masonjames Schieffer , a 61 y.o. male  was evaluated in triage.  Pt complains of sob. ? ?Review of Systems  ?Positive: Sob, leg swelling, abd swelling ?Negative: Fever, cp ? ?Physical Exam  ?BP (!) 164/118 (BP Location: Right Arm)   Pulse 97   Temp 98 ?F (36.7 ?C) (Oral)   Resp 16   SpO2 97%  ?Gen:   Awake, no distress   ?Resp:  Normal effort  ?MSK:   Moves extremities without difficulty  ?Other:  Trace edema to BLE, crackles in lung bases ? ?Medical Decision Making  ?Medically screening exam initiated at 10:07 AM.  Appropriate orders placed.  Hadley Handrich was informed that the remainder of the evaluation will be completed by another provider, this initial triage assessment does not replace that evaluation, and the importance of remaining in the ED until their evaluation is complete. ? ?Pt here with SOB worse with laying down, fluid retention, ongoing x 2 days.  No hx of CHF.  No cp ?  ?Domenic Moras, PA-C ?12/13/21 1010 ? ?

## 2021-12-13 NOTE — ED Triage Notes (Signed)
Trouble breathing for a couple of days now. Can't lay down at all without getting SOB. Respirations regular, non-labored,  ?

## 2022-01-07 ENCOUNTER — Other Ambulatory Visit: Payer: Self-pay

## 2022-01-07 ENCOUNTER — Inpatient Hospital Stay (HOSPITAL_COMMUNITY)
Admission: EM | Admit: 2022-01-07 | Discharge: 2022-01-13 | DRG: 286 | Disposition: A | Discharge status: Home Health | Payer: Medicaid Other | Attending: Cardiology | Admitting: Cardiology

## 2022-01-07 ENCOUNTER — Emergency Department (HOSPITAL_COMMUNITY): Payer: Medicaid Other

## 2022-01-07 ENCOUNTER — Encounter (HOSPITAL_COMMUNITY): Payer: Self-pay | Admitting: Emergency Medicine

## 2022-01-07 DIAGNOSIS — E1122 Type 2 diabetes mellitus with diabetic chronic kidney disease: Secondary | ICD-10-CM | POA: Diagnosis present

## 2022-01-07 DIAGNOSIS — Z79890 Hormone replacement therapy (postmenopausal): Secondary | ICD-10-CM | POA: Diagnosis not present

## 2022-01-07 DIAGNOSIS — Z79899 Other long term (current) drug therapy: Secondary | ICD-10-CM | POA: Diagnosis not present

## 2022-01-07 DIAGNOSIS — E039 Hypothyroidism, unspecified: Secondary | ICD-10-CM | POA: Diagnosis present

## 2022-01-07 DIAGNOSIS — I493 Ventricular premature depolarization: Secondary | ICD-10-CM | POA: Diagnosis not present

## 2022-01-07 DIAGNOSIS — Z597 Insufficient social insurance and welfare support: Secondary | ICD-10-CM

## 2022-01-07 DIAGNOSIS — I428 Other cardiomyopathies: Secondary | ICD-10-CM | POA: Diagnosis present

## 2022-01-07 DIAGNOSIS — I5021 Acute systolic (congestive) heart failure: Secondary | ICD-10-CM | POA: Diagnosis present

## 2022-01-07 DIAGNOSIS — N1831 Chronic kidney disease, stage 3a: Secondary | ICD-10-CM | POA: Diagnosis present

## 2022-01-07 DIAGNOSIS — Z6830 Body mass index (BMI) 30.0-30.9, adult: Secondary | ICD-10-CM | POA: Diagnosis not present

## 2022-01-07 DIAGNOSIS — I420 Dilated cardiomyopathy: Secondary | ICD-10-CM | POA: Diagnosis present

## 2022-01-07 DIAGNOSIS — R7989 Other specified abnormal findings of blood chemistry: Secondary | ICD-10-CM

## 2022-01-07 DIAGNOSIS — I5084 End stage heart failure: Secondary | ICD-10-CM | POA: Diagnosis present

## 2022-01-07 DIAGNOSIS — N183 Chronic kidney disease, stage 3 unspecified: Secondary | ICD-10-CM

## 2022-01-07 DIAGNOSIS — I2510 Atherosclerotic heart disease of native coronary artery without angina pectoris: Secondary | ICD-10-CM | POA: Diagnosis present

## 2022-01-07 DIAGNOSIS — K219 Gastro-esophageal reflux disease without esophagitis: Secondary | ICD-10-CM | POA: Diagnosis present

## 2022-01-07 DIAGNOSIS — R0902 Hypoxemia: Secondary | ICD-10-CM | POA: Diagnosis present

## 2022-01-07 DIAGNOSIS — R609 Edema, unspecified: Principal | ICD-10-CM

## 2022-01-07 DIAGNOSIS — Z8249 Family history of ischemic heart disease and other diseases of the circulatory system: Secondary | ICD-10-CM | POA: Diagnosis not present

## 2022-01-07 DIAGNOSIS — E669 Obesity, unspecified: Secondary | ICD-10-CM | POA: Diagnosis present

## 2022-01-07 DIAGNOSIS — I10 Essential (primary) hypertension: Secondary | ICD-10-CM

## 2022-01-07 DIAGNOSIS — N179 Acute kidney failure, unspecified: Secondary | ICD-10-CM

## 2022-01-07 DIAGNOSIS — I130 Hypertensive heart and chronic kidney disease with heart failure and stage 1 through stage 4 chronic kidney disease, or unspecified chronic kidney disease: Principal | ICD-10-CM | POA: Diagnosis present

## 2022-01-07 DIAGNOSIS — I340 Nonrheumatic mitral (valve) insufficiency: Secondary | ICD-10-CM | POA: Diagnosis present

## 2022-01-07 DIAGNOSIS — I509 Heart failure, unspecified: Secondary | ICD-10-CM

## 2022-01-07 DIAGNOSIS — D696 Thrombocytopenia, unspecified: Secondary | ICD-10-CM | POA: Diagnosis present

## 2022-01-07 DIAGNOSIS — R0602 Shortness of breath: Secondary | ICD-10-CM | POA: Diagnosis present

## 2022-01-07 DIAGNOSIS — Z7984 Long term (current) use of oral hypoglycemic drugs: Secondary | ICD-10-CM | POA: Diagnosis not present

## 2022-01-07 DIAGNOSIS — R600 Localized edema: Secondary | ICD-10-CM

## 2022-01-07 DIAGNOSIS — Z91148 Patient's other noncompliance with medication regimen for other reason: Secondary | ICD-10-CM

## 2022-01-07 DIAGNOSIS — N400 Enlarged prostate without lower urinary tract symptoms: Secondary | ICD-10-CM | POA: Diagnosis present

## 2022-01-07 DIAGNOSIS — E1100 Type 2 diabetes mellitus with hyperosmolarity without nonketotic hyperglycemic-hyperosmolar coma (NKHHC): Secondary | ICD-10-CM | POA: Diagnosis present

## 2022-01-07 DIAGNOSIS — I5082 Biventricular heart failure: Secondary | ICD-10-CM | POA: Diagnosis present

## 2022-01-07 LAB — COMPREHENSIVE METABOLIC PANEL
ALT: 57 U/L — ABNORMAL HIGH (ref 0–44)
AST: 56 U/L — ABNORMAL HIGH (ref 15–41)
Albumin: 3.6 g/dL (ref 3.5–5.0)
Alkaline Phosphatase: 40 U/L (ref 38–126)
Anion gap: 9 (ref 5–15)
BUN: 26 mg/dL — ABNORMAL HIGH (ref 8–23)
CO2: 20 mmol/L — ABNORMAL LOW (ref 22–32)
Calcium: 8.6 mg/dL — ABNORMAL LOW (ref 8.9–10.3)
Chloride: 110 mmol/L (ref 98–111)
Creatinine, Ser: 1.64 mg/dL — ABNORMAL HIGH (ref 0.61–1.24)
GFR, Estimated: 47 mL/min — ABNORMAL LOW (ref >60)
Glucose, Bld: 171 mg/dL — ABNORMAL HIGH (ref 70–99)
Potassium: 3.8 mmol/L (ref 3.5–5.1)
Sodium: 139 mmol/L (ref 135–145)
Total Bilirubin: 1 mg/dL (ref 0.3–1.2)
Total Protein: 7.4 g/dL (ref 6.5–8.1)

## 2022-01-07 LAB — CBC
HCT: 38.1 % — ABNORMAL LOW (ref 39.0–52.0)
Hemoglobin: 12.6 g/dL — ABNORMAL LOW (ref 13.0–17.0)
MCH: 30 pg (ref 26.0–34.0)
MCHC: 33.1 g/dL (ref 30.0–36.0)
MCV: 90.7 fL (ref 80.0–100.0)
Platelets: 144 10*3/uL — ABNORMAL LOW (ref 150–400)
RBC: 4.2 MIL/uL — ABNORMAL LOW (ref 4.22–5.81)
RDW: 14.9 % (ref 11.5–15.5)
WBC: 4.2 10*3/uL (ref 4.0–10.5)
nRBC: 0 % (ref 0.0–0.2)

## 2022-01-07 LAB — GLUCOSE, CAPILLARY
Glucose-Capillary: 135 mg/dL — ABNORMAL HIGH (ref 70–99)
Glucose-Capillary: 250 mg/dL — ABNORMAL HIGH (ref 70–99)
Glucose-Capillary: 174 mg/dL — ABNORMAL HIGH (ref 70–99)

## 2022-01-07 LAB — TROPONIN I (HIGH SENSITIVITY)
Troponin I (High Sensitivity): 27 ng/L — ABNORMAL HIGH (ref <18)
Troponin I (High Sensitivity): 29 ng/L — ABNORMAL HIGH (ref <18)

## 2022-01-07 LAB — HEMOGLOBIN A1C
Hgb A1c MFr Bld: 7.6 % — ABNORMAL HIGH (ref 4.8–5.6)
Mean Plasma Glucose: 171.42 mg/dL

## 2022-01-07 LAB — BRAIN NATRIURETIC PEPTIDE: B Natriuretic Peptide: 1116.3 pg/mL — ABNORMAL HIGH (ref 0.0–100.0)

## 2022-01-07 MED ORDER — ALBUTEROL SULFATE HFA 108 (90 BASE) MCG/ACT IN AERS: 2.0000 | INHALATION_SPRAY | RESPIRATORY_TRACT | Status: DC | PRN

## 2022-01-07 MED ORDER — OXYCODONE-ACETAMINOPHEN 5-325 MG PO TABS
1.0000 | ORAL_TABLET | ORAL | Status: DC | PRN
  Administered 2022-01-08 – 2022-01-10 (×2): 1 via ORAL
  Filled 2022-01-07 (×2): qty 1

## 2022-01-07 MED ORDER — SODIUM CHLORIDE 0.9% FLUSH: 3.0000 mL | INTRAVENOUS | Status: DC | PRN

## 2022-01-07 MED ORDER — ASPIRIN EC 81 MG PO TBEC
81.0000 mg | DELAYED_RELEASE_TABLET | Freq: Every day | ORAL | Status: DC
  Administered 2022-01-07 – 2022-01-09 (×3): 81 mg via ORAL
  Filled 2022-01-07 (×3): qty 1

## 2022-01-07 MED ORDER — FUROSEMIDE 10 MG/ML IJ SOLN: 40.0000 mg | Freq: Once | INTRAMUSCULAR | Status: DC

## 2022-01-07 MED ORDER — FUROSEMIDE 10 MG/ML IJ SOLN
100.0000 mg | Freq: Once | INTRAVENOUS | Status: AC
  Administered 2022-01-07: 100 mg via INTRAVENOUS
  Filled 2022-01-07: qty 10

## 2022-01-07 MED ORDER — CARVEDILOL 3.125 MG PO TABS
3.1250 mg | ORAL_TABLET | Freq: Two times a day (BID) | ORAL | Status: DC
  Administered 2022-01-07 – 2022-01-09 (×4): 3.125 mg via ORAL
  Filled 2022-01-07 (×4): qty 1

## 2022-01-07 MED ORDER — SODIUM CHLORIDE 0.9 % IV SOLN: 250.0000 mL | INTRAVENOUS | Status: DC | PRN

## 2022-01-07 MED ORDER — INSULIN ASPART 100 UNIT/ML IJ SOLN
3.0000 [IU] | Freq: Three times a day (TID) | INTRAMUSCULAR | Status: DC
  Administered 2022-01-07 – 2022-01-08 (×3): 3 [IU] via SUBCUTANEOUS
  Filled 2022-01-07: qty 0.03

## 2022-01-07 MED ORDER — ONDANSETRON HCL 4 MG/2ML IJ SOLN: 4.0000 mg | Freq: Four times a day (QID) | INTRAMUSCULAR | Status: DC | PRN

## 2022-01-07 MED ORDER — FUROSEMIDE 10 MG/ML IJ SOLN: 60.0000 mg | Freq: Two times a day (BID) | INTRAMUSCULAR | Status: DC

## 2022-01-07 MED ORDER — ACETAMINOPHEN 325 MG PO TABS: 650.0000 mg | ORAL_TABLET | ORAL | Status: DC | PRN

## 2022-01-07 MED ORDER — MELATONIN 5 MG PO TABS
5.0000 mg | ORAL_TABLET | Freq: Once | ORAL | Status: AC
  Administered 2022-01-07: 5 mg via ORAL
  Filled 2022-01-07: qty 1

## 2022-01-07 MED ORDER — INSULIN ASPART 100 UNIT/ML IJ SOLN
0.0000 [IU] | Freq: Three times a day (TID) | INTRAMUSCULAR | Status: DC
  Administered 2022-01-07: 1 [IU] via SUBCUTANEOUS
  Administered 2022-01-08 (×2): 2 [IU] via SUBCUTANEOUS
  Administered 2022-01-08: 1 [IU] via SUBCUTANEOUS
  Administered 2022-01-09: 3 [IU] via SUBCUTANEOUS
  Administered 2022-01-09: 1 [IU] via SUBCUTANEOUS
  Administered 2022-01-09 – 2022-01-11 (×6): 2 [IU] via SUBCUTANEOUS
  Administered 2022-01-12: 1 [IU] via SUBCUTANEOUS
  Administered 2022-01-12 – 2022-01-13 (×3): 3 [IU] via SUBCUTANEOUS
  Administered 2022-01-13: 2 [IU] via SUBCUTANEOUS
  Filled 2022-01-07: qty 0.09

## 2022-01-07 MED ORDER — LEVOTHYROXINE SODIUM 50 MCG PO TABS: 75.0000 ug | ORAL_TABLET | Freq: Every day | ORAL | Status: DC

## 2022-01-07 MED ORDER — SODIUM CHLORIDE 0.9% FLUSH
3.0000 mL | Freq: Two times a day (BID) | INTRAVENOUS | Status: DC
  Administered 2022-01-07 – 2022-01-09 (×5): 3 mL via INTRAVENOUS

## 2022-01-07 MED ORDER — INSULIN ASPART 100 UNIT/ML IJ SOLN
0.0000 [IU] | Freq: Every day | INTRAMUSCULAR | Status: DC
  Administered 2022-01-10: 3 [IU] via SUBCUTANEOUS
  Administered 2022-01-11: 5 [IU] via SUBCUTANEOUS
  Administered 2022-01-12: 2 [IU] via SUBCUTANEOUS
  Filled 2022-01-07: qty 0.05

## 2022-01-07 MED ORDER — ENOXAPARIN SODIUM 40 MG/0.4ML IJ SOSY
40.0000 mg | PREFILLED_SYRINGE | INTRAMUSCULAR | Status: DC
  Administered 2022-01-07 – 2022-01-09 (×3): 40 mg via SUBCUTANEOUS
  Filled 2022-01-07 (×3): qty 0.4

## 2022-01-07 MED ORDER — FUROSEMIDE 10 MG/ML IJ SOLN: 80.0000 mg | Freq: Two times a day (BID) | INTRAMUSCULAR | Status: DC

## 2022-01-07 MED ORDER — POTASSIUM CHLORIDE CRYS ER 20 MEQ PO TBCR
40.0000 meq | EXTENDED_RELEASE_TABLET | Freq: Two times a day (BID) | ORAL | Status: AC
Start: 2022-01-07 — End: 2022-01-08
  Administered 2022-01-07 – 2022-01-08 (×2): 40 meq via ORAL
  Filled 2022-01-07 (×2): qty 2

## 2022-01-07 MED ORDER — GEMFIBROZIL 600 MG PO TABS: 600.0000 mg | ORAL_TABLET | Freq: Two times a day (BID) | ORAL | Status: DC

## 2022-01-07 MED ORDER — TAMSULOSIN HCL 0.4 MG PO CAPS: 0.4000 mg | ORAL_CAPSULE | Freq: Every day | ORAL | Status: DC

## 2022-01-07 NOTE — ED Provider Triage Note (Signed)
Emergency Medicine Provider Triage Evaluation Note ? ?Eric Larson , a 61 y.o. male  was evaluated in triage.  Pt complains of shortness of breath, leg swelling and abdominal swelling for 2 weeks.  Patient was seen at outside facility on 4/13 and was diagnosed with new onset heart failure.  He was discharged with prescription for Lasix.  He states that he has been taking Lasix daily, but has not helped with his swelling. ? ?Review of Systems  ?Positive: Shortness of breath, leg swelling, abdominal distention ?Negative: Fever, cough, chest pain ? ?Physical Exam  ?BP (!) 128/108 (BP Location: Left Arm)   Pulse 98   Temp 98.1 ?F (36.7 ?C) (Oral)   Resp 16   SpO2 95%  ?Gen:   Awake, no distress   ?Resp:  Normal effort  ?MSK:   Moves extremities without difficulty  ?Other:   ? ?Medical Decision Making  ?Medically screening exam initiated at 12:46 PM.  Appropriate orders placed.  Eric Larson was informed that the remainder of the evaluation will be completed by another provider, this initial triage assessment does not replace that evaluation, and the importance of remaining in the ED until their evaluation is complete. ? ? ?  ?Eric Monks, PA-C ?01/07/22 1247 ? ?

## 2022-01-07 NOTE — ED Provider Notes (Signed)
?[facility] DEPT ?Provider Note ? ? ?CSN: [id number] ?Arrival date & time: 01/07/22  1207 ? ?  ? ?History ? ?Chief Complaint  ?Patient presents with  ? Shortness of Breath  ? Leg Swelling  ? ? ?Eric Larson is a 61 y.o. male. ? ?The history is provided by the patient and medical records. No language interpreter was used.  ?Shortness of Breath ?Severity:  Severe ?Onset quality:  Gradual ?Duration:  2 weeks ?Timing:  Constant ?Progression:  Worsening ?Chronicity:  New ?Context: not URI   ?Relieved by:  Nothing ?Worsened by:  Exertion ?Ineffective treatments:  None tried ?Associated symptoms: no abdominal pain, no chest pain, no cough, no diaphoresis, no fever, no headaches, no neck pain, no rash, no sputum production, no syncope, no vomiting and no wheezing   ?Risk factors: no hx of PE/DVT   ? ?  ? ?Home Medications ?Prior to Admission medications   ?Medication Sig Start Date End Date Taking? Authorizing Provider  ?Blood Glucose Monitoring Suppl (TRUE METRIX METER) W/DEVICE KIT 1 each by Does not apply route 3 (three) times daily. 08/25/15   Tresa Garter, MD  ?gemfibrozil (LOPID) 600 MG tablet Take 1 tablet (600 mg total) by mouth 2 (two) times daily before a meal. 09/06/16 02/09/22  Alfonse Spruce, FNP  ?glucose blood test strip Use as instructed 11/18/16   Alfonse Spruce, FNP  ?levothyroxine (SYNTHROID, LEVOTHROID) 75 MCG tablet Take 1 tablet (75 mcg total) by mouth daily. 09/06/16   Alfonse Spruce, FNP  ?lisinopril (PRINIVIL,ZESTRIL) 10 MG tablet Take 1 tablet (10 mg total) by mouth daily. 11/13/16   Alfonse Spruce, FNP  ?metFORMIN (GLUCOPHAGE) 500 MG tablet Take 1 tablet (500 mg total) by mouth 2 (two) times daily with a meal. 09/04/16   Fredia Beets R, FNP  ?oxyCODONE-acetaminophen (PERCOCET/ROXICET) 5-325 MG tablet Take 1 tablet by mouth every 4 (four) hours as needed for severe pain. 01/06/18   Hedges, Dellis Filbert, PA-C  ?tamsulosin (FLOMAX) 0.4 MG CAPS  capsule Take 1 capsule (0.4 mg total) by mouth daily. 01/06/18   Okey Regal, PA-C  ?   ? ?Allergies    ?Patient has no known allergies.   ? ?Review of Systems   ?Review of Systems  ?Constitutional:  Positive for fatigue. Negative for chills, diaphoresis and fever.  ?HENT:  Negative for congestion.   ?Respiratory:  Positive for shortness of breath. Negative for cough, sputum production, chest tightness, wheezing and stridor.   ?Cardiovascular:  Positive for leg swelling. Negative for chest pain, palpitations and syncope.  ?Gastrointestinal:  Negative for abdominal pain, constipation, diarrhea, nausea and vomiting.  ?Genitourinary:  Negative for dysuria and flank pain.  ?Musculoskeletal:  Negative for back pain, neck pain and neck stiffness.  ?Skin:  Negative for rash and wound.  ?Neurological:  Negative for weakness, light-headedness, numbness and headaches.  ?Psychiatric/Behavioral:  Negative for agitation and confusion.   ?All other systems reviewed and are negative. ? ?Physical Exam ?Updated Vital Signs ?BP 132/79   Pulse 89   Temp 98.1 ?F (36.7 ?C) (Oral)   Resp 20   SpO2 100%  ?Physical Exam ?Vitals and nursing note reviewed.  ?Constitutional:   ?   General: He is not in acute distress. ?   Appearance: He is well-developed. He is not ill-appearing, toxic-appearing or diaphoretic.  ?HENT:  ?   Head: Normocephalic and atraumatic.  ?Eyes:  ?   Conjunctiva/sclera: Conjunctivae normal.  ?   Pupils: Pupils are equal, round, and  reactive to light.  ?Cardiovascular:  ?   Rate and Rhythm: Normal rate and regular rhythm.  ?   Heart sounds: No murmur heard. ?Pulmonary:  ?   Effort: Pulmonary effort is normal. Tachypnea present. No respiratory distress.  ?   Breath sounds: Rales present. No rhonchi.  ?Chest:  ?   Chest wall: No tenderness.  ?Abdominal:  ?   Palpations: Abdomen is soft.  ?   Tenderness: There is no abdominal tenderness.  ?Musculoskeletal:     ?   General: No swelling.  ?   Cervical back: Neck supple.   ?   Right lower leg: No tenderness. Edema present.  ?   Left lower leg: No tenderness. Edema present.  ?Skin: ?   General: Skin is warm and dry.  ?   Capillary Refill: Capillary refill takes less than 2 seconds.  ?   Findings: No erythema.  ?Neurological:  ?   General: No focal deficit present.  ?   Mental Status: He is alert.  ?Psychiatric:     ?   Mood and Affect: Mood normal.  ? ? ?ED Results / Procedures / Treatments   ?Labs ?(all labs ordered are listed, but only abnormal results are displayed) ?Labs Reviewed  ?CBC - Abnormal; Notable for the following components:  ?    Result Value  ? RBC 4.20 (*)   ? Hemoglobin 12.6 (*)   ? HCT 38.1 (*)   ? Platelets 144 (*)   ? All other components within normal limits  ?COMPREHENSIVE METABOLIC PANEL - Abnormal; Notable for the following components:  ? CO2 20 (*)   ? Glucose, Bld 171 (*)   ? BUN 26 (*)   ? Creatinine, Ser 1.64 (*)   ? Calcium 8.6 (*)   ? AST 56 (*)   ? ALT 57 (*)   ? GFR, Estimated 47 (*)   ? All other components within normal limits  ?BRAIN NATRIURETIC PEPTIDE - Abnormal; Notable for the following components:  ? B Natriuretic Peptide 1,116.3 (*)   ? All other components within normal limits  ?TROPONIN I (HIGH SENSITIVITY) - Abnormal; Notable for the following components:  ? Troponin I (High Sensitivity) 27 (*)   ? All other components within normal limits  ?TROPONIN I (HIGH SENSITIVITY)  ? ? ?EKG ?None ? ?ED ECG REPORT ? ? Date: 01/07/2022 ? Rate: 96 ? Rhythm: normal sinus rhythm ? QRS Axis: normal ? Intervals: normal ? ST/T Wave abnormalities:  simialr t wave inversions to prior ? Conduction Disutrbances:none ? Narrative Interpretation:  ? Old EKG Reviewed: unchanged ? ?I have personally reviewed the EKG tracing and agree with the computerized printout as noted. ? ? ?Radiology ?DG Chest 2 View ? ?Result Date: 01/07/2022 ?CLINICAL DATA:  Shortness of breath EXAM: CHEST - 2 VIEW COMPARISON:  12/13/2021 FINDINGS: Similar cardiomegaly and vascular congestion,  with mid and lower lung interstitial opacities, possibly mild pulmonary edema. Bibasilar atelectasis. No significant pleural effusion or pneumothorax. IMPRESSION: Cardiomegaly with likely mild pulmonary edema. Electronically Signed   By: Merilyn Baba M.D.   On: 01/07/2022 12:52   ? ?Procedures ?Procedures  ? ? ?Medications Ordered in ED ?Medications  ?furosemide (LASIX) injection 40 mg (has no administration in time range)  ? ? ?ED Course/ Medical Decision Making/ A&P ?  ?                        ?Medical Decision Making ?Amount and/or Complexity of Data  Reviewed ?Radiology: ordered. ? ?Risk ?Prescription drug management. ?Decision regarding hospitalization. ? ? ? ?Eric Larson is a 61 y.o. male with a past medical history significant for CKD, diabetes, GERD, hypothyroidism, and concern for development of CHF started on Lasix several weeks ago who presents with worsened shortness of breath, edema, and fatigue.  According to patient, he has been taking the Lasix as directed and he is continue to urinate but feels that his edema is worsening.  He says the last few days he has had even worsened shortness of breath and cannot lay flat and is not sleeping.  He denies any chest pain or palpitations.  Denies nausea, vomiting, constipation, or diarrhea.  He denies any abdominal pain, back pain, or chest pain.  Denies any fevers or chills.  Denies any sick contacts. ? ?On my initial evaluation, patient is tachypneic and short of breath.  He does not have tachycardia initially.  On exam he does have rales in the base of his lungs but I do not appreciate a murmur initially.  Chest abdomen nontender.  Legs are edematous but intact sensation, strength, and pulses.  He is afebrile and not warm to the touch. ? ?Clinically I suspect patient is having worsened fluid overload from possible CHF.  It appears that patient was seen at an emergency department where he was found to have some fluid overload although he does not appear to  have a formal diagnosis of CHF.  Patient had some work-up started in triage.  BNP was found to be persistent elevated at over 1100 and troponin is elevated at 27.  We will trend the troponin.  His metabolic p

## 2022-01-07 NOTE — ED Triage Notes (Signed)
Pt reports SHOB, leg swelling, ankle swelling, and abd swelling x 2 weeks.  ?

## 2022-01-07 NOTE — H&P (Addendum)
? ? ?History and Physical ? ?Eric Larson SPQ:[id number] DOB: 13-Jan-1961 DOA: 01/07/2022 ? ?Eric Larson: Eric Larson, No ?Patient coming from: Home ? ?I have personally briefly reviewed patient's old medical records in [place] ? ? ?Chief Complaint: SOB and orthopnea ? ?HPI: Eric Larson is a 61 y.o. male past medical history of diabetes mellitus type 2 poorly controlled, BPH and hypothyroidism who was recently seen in the ED about 2 weeks prior to admission for shortness of breath was told he had heart failure placed on Lasix comes back to the ED with worsening symptoms orthopnea cannot sleep at night has to sleep in a recliner. ?Not able to walk 100 feet without getting short of breath, he relates his feet are swollen decreased appetite. ? ?In the ED: ?Blood pressure was elevated at 150/107 satting 95% on room air when ambulating dropped down to 84, chest x-ray which I personally reviewed shows pulmonary edema, BNP of 1000, creatinine 1.6, hemoglobin of 12 platelet count 144. ? ? ?Review of Systems: All systems reviewed and apart from history of presenting illness, are negative. ? ?Past Medical History:  ?Diagnosis Date  ? Acid reflux   ? Diabetes mellitus without complication ([place])   ? Gout   ? ?Past Surgical History:  ?Procedure Laterality Date  ? THYROID SURGERY    ? ?Social History:  reports that he has never smoked. He has never used smokeless tobacco. He reports that he does not drink alcohol and does not use drugs. ? ? ?No Known Allergies ? ?Family History  ?Problem Relation Age of Onset  ? Heart attack Father   ? Diabetes Mellitus II Neg Hx   ? CAD Neg Hx   ? ? ?Prior to Admission medications   ?Medication Sig Start Date End Date Taking? Authorizing Provider  ?Blood Glucose Monitoring Suppl (TRUE METRIX METER) W/DEVICE KIT 1 each by Does not apply route 3 (three) times daily. 08/25/15   Tresa Garter, MD  ?gemfibrozil (LOPID) 600 MG tablet Take 1 tablet (600 mg total) by mouth 2 (two) times daily before  a meal. 09/06/16 02/09/22  Alfonse Spruce, FNP  ?glucose blood test strip Use as instructed 11/18/16   Alfonse Spruce, FNP  ?levothyroxine (SYNTHROID, LEVOTHROID) 75 MCG tablet Take 1 tablet (75 mcg total) by mouth daily. 09/06/16   Alfonse Spruce, FNP  ?lisinopril (PRINIVIL,ZESTRIL) 10 MG tablet Take 1 tablet (10 mg total) by mouth daily. 11/13/16   Alfonse Spruce, FNP  ?metFORMIN (GLUCOPHAGE) 500 MG tablet Take 1 tablet (500 mg total) by mouth 2 (two) times daily with a meal. 09/04/16   Fredia Beets R, FNP  ?oxyCODONE-acetaminophen (PERCOCET/ROXICET) 5-325 MG tablet Take 1 tablet by mouth every 4 (four) hours as needed for severe pain. 01/06/18   Hedges, Dellis Filbert, PA-C  ?tamsulosin (FLOMAX) 0.4 MG CAPS capsule Take 1 capsule (0.4 mg total) by mouth daily. 01/06/18   Okey Regal, PA-C  ? ?Physical Exam: ?Vitals:  ? 01/07/22 1245 01/07/22 1300 01/07/22 1315 01/07/22 1426  ?BP: (!) 133/104 (!) 141/101 132/79 (!) 150/107  ?Pulse: 98 83 89 82  ?Resp: (!) 23 (!) 27 20 (!) 30  ?Temp:      ?TempSrc:      ?SpO2: 96% 98% 100% 97%  ? ? ?General exam: Moderately built and nourished patient, sitting up cannot lay in bed ?Head, eyes and ENT: Nontraumatic and normocephalic.  ?Neck: Supple. No JVD, carotid bruit or thyromegaly. ?Lymphatics: No lymphadenopathy. ?Respiratory system: Air movement with crackles at bases  bilaterally. ?Cardiovascular system: S1 and S2 heard, I believe I can appreciate an S3 RRR.  Positive JVD ?Gastrointestinal system: Abdomen is nondistended, soft and nontender. Normal bowel sounds heard. No organomegaly or masses appreciated. ?Central nervous system: Alert and oriented. No focal neurological deficits. ?Extremities: 3+ edema ?Skin: No rashes or acute findings. ?Musculoskeletal system: Negative exam. ?Psychiatry: Pleasant and cooperative. ? ? ?Labs on Admission:  ?Basic Metabolic Panel: ?Recent Labs  ?Lab 01/07/22 ?1315  ?NA 139  ?K 3.8  ?CL 110  ?CO2 20*  ?GLUCOSE 171*  ?BUN 26*   ?CREATININE 1.64*  ?CALCIUM 8.6*  ? ?Liver Function Tests: ?Recent Labs  ?Lab 01/07/22 ?1315  ?AST 56*  ?ALT 57*  ?ALKPHOS 40  ?BILITOT 1.0  ?PROT 7.4  ?ALBUMIN 3.6  ? ?No results for input(s): LIPASE, AMYLASE in the last 168 hours. ?No results for input(s): AMMONIA in the last 168 hours. ?CBC: ?Recent Labs  ?Lab 01/07/22 ?1315  ?WBC 4.2  ?HGB 12.6*  ?HCT 38.1*  ?MCV 90.7  ?PLT 144*  ? ?Cardiac Enzymes: ?No results for input(s): CKTOTAL, CKMB, CKMBINDEX, TROPONINI in the last 168 hours. ? ?BNP (last 3 results) ?No results for input(s): PROBNP in the last 8760 hours. ?CBG: ?No results for input(s): GLUCAP in the last 168 hours. ? ?Radiological Exams on Admission: ?DG Chest 2 View ? ?Result Date: 01/07/2022 ?CLINICAL DATA:  Shortness of breath EXAM: CHEST - 2 VIEW COMPARISON:  12/13/2021 FINDINGS: Similar cardiomegaly and vascular congestion, with mid and lower lung interstitial opacities, possibly mild pulmonary edema. Bibasilar atelectasis. No significant pleural effusion or pneumothorax. IMPRESSION: Cardiomegaly with likely mild pulmonary edema. Electronically Signed   By: Merilyn Baba M.D.   On: 01/07/2022 12:52   ? ?EKG: Independently reviewed.  Twelve-lead EKG shows sinus rhythm with PACs normal axis LVH, T wave inversion in lateral leads. ? ?Assessment/Plan ?Acute heart failure ([place]) ?Agree with IV Lasix, will put on potassium supplementation. ?Limit his oral fluid intake. ?Monitor strict I's and O's and daily weights. ?We will need a 2D echo, cardiac biomarkers have basically remained flat he denies any chest pain and denies any chest pain at all. ? ?Essential hypertension: ?Hold lisinopril might benefit from [facility], will start him on IV Lasix low-dose Coreg. ?Reevaluate in the morning. ? ?Type 2 diabetes mellitus with hyperosmolar nonketotic hyperglycemia ([place]) ?At home he is on metformin we will hold. ?Low-dose long-acting insulin plus sliding scale. ?Check an A1c. ? ?Acute kidney injury on CKD (chronic  kidney disease) stage 3, GFR 30-59 ml/min (HCC) ?Last creatinine back in November 2022 creatinine was 1.2, on admission 1.6 question cardiorenal syndrome. ?We will diurese aggressively and recheck basic metabolic panel tomorrow morning. ? ?Normocytic anemia: ?No signs of overt bleeding he does not when was his last colonoscopy. ?No signs of overt bleeding will need follow-up with Eric Larson. ? ?Mild thrombocytopenia: ?Noted will need to be followed up as an outpatient. ? ?  ?DVT Prophylaxis: lovenox ?Code Status: full  ?Family Communication: none  ?Disposition Plan: inpatient  ? ?Time spent: 75 min ? ? ? ?It is my clinical opinion that admission to INPATIENT is reasonable and necessary in this 61 y.o. male past medical history of diabetes mellitus comes at fluid overloaded hypoxic, needs IV diuresis and 2D echo and further evaluation. ? ? ?Given the aforementioned, the predictability of an adverse outcome is felt to be significant. I expect that the patient will require at least 2 midnights in the hospital to treat this condition. ? ?Charlynne Cousins MD ?  [facility] ? ? ?01/07/2022, 3:48 PM  ? ? ?

## 2022-01-07 NOTE — ED Notes (Addendum)
Ambulated around nurses station.  Pt's oxygen saturations dropped as low as 86% on RA.  Pt tachycardic up to 115 HR.  Pt tachypnea up into the 30's.  MD Tegeler notified and aware.  ?

## 2022-01-08 ENCOUNTER — Inpatient Hospital Stay (HOSPITAL_COMMUNITY): Payer: Medicaid Other

## 2022-01-08 DIAGNOSIS — R0902 Hypoxemia: Secondary | ICD-10-CM

## 2022-01-08 DIAGNOSIS — I10 Essential (primary) hypertension: Secondary | ICD-10-CM

## 2022-01-08 DIAGNOSIS — R0609 Other forms of dyspnea: Secondary | ICD-10-CM

## 2022-01-08 LAB — ECHOCARDIOGRAM COMPLETE
Area-P 1/2: 7.94 cm2
Calc EF: 20.2 %
Height: 72 in
MV M vel: 4.66 m/s
MV Peak grad: 86.9 mmHg
Radius: 0.6 cm
S' Lateral: 5.5 cm
Single Plane A2C EF: 17.7 %
Single Plane A4C EF: 24.7 %
Weight: 3552.05 oz

## 2022-01-08 LAB — BASIC METABOLIC PANEL
Anion gap: 9 (ref 5–15)
BUN: 30 mg/dL — ABNORMAL HIGH (ref 8–23)
CO2: 23 mmol/L (ref 22–32)
Calcium: 9.1 mg/dL (ref 8.9–10.3)
Chloride: 108 mmol/L (ref 98–111)
Creatinine, Ser: 1.85 mg/dL — ABNORMAL HIGH (ref 0.61–1.24)
GFR, Estimated: 41 mL/min — ABNORMAL LOW (ref >60)
Glucose, Bld: 173 mg/dL — ABNORMAL HIGH (ref 70–99)
Potassium: 4.3 mmol/L (ref 3.5–5.1)
Sodium: 140 mmol/L (ref 135–145)

## 2022-01-08 LAB — GLUCOSE, CAPILLARY
Glucose-Capillary: 177 mg/dL — ABNORMAL HIGH (ref 70–99)
Glucose-Capillary: 200 mg/dL — ABNORMAL HIGH (ref 70–99)
Glucose-Capillary: 132 mg/dL — ABNORMAL HIGH (ref 70–99)
Glucose-Capillary: 158 mg/dL — ABNORMAL HIGH (ref 70–99)

## 2022-01-08 LAB — HIV ANTIBODY (ROUTINE TESTING W REFLEX): HIV Screen 4th Generation wRfx: NONREACTIVE

## 2022-01-08 MED ORDER — FUROSEMIDE 10 MG/ML IJ SOLN
20.0000 mg | Freq: Once | INTRAMUSCULAR | Status: AC
  Administered 2022-01-08: 20 mg via INTRAVENOUS
  Filled 2022-01-08: qty 2

## 2022-01-08 MED ORDER — METOLAZONE 2.5 MG PO TABS
2.5000 mg | ORAL_TABLET | Freq: Once | ORAL | Status: DC
  Filled 2022-01-08: qty 1

## 2022-01-08 MED ORDER — ZOLPIDEM TARTRATE 5 MG PO TABS
5.0000 mg | ORAL_TABLET | Freq: Every evening | ORAL | Status: DC | PRN
Start: 2022-01-08 — End: 2022-01-13
  Administered 2022-01-09: 5 mg via ORAL
  Filled 2022-01-08 (×2): qty 1

## 2022-01-08 MED ORDER — PERFLUTREN LIPID MICROSPHERE
1.0000 mL | INTRAVENOUS | Status: AC | PRN
  Administered 2022-01-08: 3 mL via INTRAVENOUS
  Filled 2022-01-08: qty 10

## 2022-01-08 MED ORDER — FUROSEMIDE 10 MG/ML IJ SOLN
80.0000 mg | Freq: Two times a day (BID) | INTRAMUSCULAR | Status: DC
  Administered 2022-01-08 – 2022-01-09 (×3): 80 mg via INTRAVENOUS
  Filled 2022-01-08 (×3): qty 8

## 2022-01-08 MED ORDER — INSULIN ASPART 100 UNIT/ML IJ SOLN
4.0000 [IU] | Freq: Three times a day (TID) | INTRAMUSCULAR | Status: DC
  Administered 2022-01-08 – 2022-01-13 (×13): 4 [IU] via SUBCUTANEOUS

## 2022-01-08 MED ORDER — ALBUTEROL SULFATE HFA 108 (90 BASE) MCG/ACT IN AERS: 2.0000 | INHALATION_SPRAY | RESPIRATORY_TRACT | Status: DC | PRN

## 2022-01-08 MED ORDER — METOLAZONE 2.5 MG PO TABS
2.5000 mg | ORAL_TABLET | Freq: Once | ORAL | Status: AC
  Administered 2022-01-08: 2.5 mg via ORAL
  Filled 2022-01-08: qty 1

## 2022-01-08 MED ORDER — INSULIN DETEMIR 100 UNIT/ML ~~LOC~~ SOLN
5.0000 [IU] | Freq: Every day | SUBCUTANEOUS | Status: DC
Start: 2022-01-08 — End: 2022-01-13
  Administered 2022-01-08 – 2022-01-13 (×5): 5 [IU] via SUBCUTANEOUS
  Filled 2022-01-08 (×6): qty 0.05

## 2022-01-08 NOTE — TOC Initial Note (Addendum)
Transition of Care (TOC) - Initial/Assessment Note  ? ? ?Patient Details  ?Name: Eric Larson ?MRN: [id number] ?Date of Birth: 04-Feb-1961 ? ?Transition of Care [facility]) CM/SW Contact:    ?Leeroy Cha, RN ?Phone Number: ?01/08/2022, 8:09 AM ? ?Clinical Narrative:                 ?Pt will need honme heart failure program.  Has no insurance [facility] is performing charity this week will send to rep. ?Heart failure program accepted by [facility]. ?Expected Discharge Plan: [place] ?Barriers to Discharge: No Barriers Identified ? ? ?Patient Goals and CMS Choice ?Patient states their goals for this hospitalization and ongoing recovery are:: to go hiome ?CMS Medicare.gov Compare Post Acute Care list provided to:: Patient ?Choice offered to / list presented to : Patient ? ?Expected Discharge Plan and Services ?Expected Discharge Plan: [place] ?  ?Discharge Planning Services: CM Consult ?Post Acute Care Choice: Home Health ?Living arrangements for the past 2 months: [place] ?                ?  ?  ?  ?  ?  ?  ?  ?  ?  ?  ? ?Prior Living Arrangements/Services ?Living arrangements for the past 2 months: [place] ?Lives with:: Self ?Patient language and need for interpreter reviewed:: Yes ?Do you feel safe going back to the place where you live?: Yes      ?  ?  ?  ?Criminal Activity/Legal Involvement Pertinent to Current Situation/Hospitalization: No - Comment as needed ? 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 ?Permission Sought/Granted ?  ?  ?   ?   ?   ?   ? ?Emotional Assessment ?Appearance:: Appears stated age ?  ?  ?Orientation: : Oriented to Self, Oriented to Place, Oriented to  Time, Oriented to Situation ?Alcohol / Substance Use: Not Applicable ?Psych Involvement: No (comment) ? ?Admission diagnosis:  Shortness of breath [R06.02] ?Acute heart failure ([place]) [I50.9] ?Peripheral edema [R60.9] ?Hypoxia [R09.02] ?Elevated brain natriuretic peptide (BNP) level [R79.89] ?Patient Active Problem List  ? Diagnosis Date Noted  ? Acute heart failure ([place]) 01/07/2022  ? Essential hypertension 01/07/2022  ? Diabetes mellitus ([place]) 10/22/2013  ? Hypothyroidism 10/08/2013  ? AKI (acute kidney injury) ([place]) 10/07/2013  ? Hyperglycemia without ketosis 10/06/2013  ? DKA (diabetic ketoacidoses) 10/06/2013  ? Elevated blood pressure 10/06/2013  ? History of gout 10/06/2013  ? History of goiter 10/06/2013  ? Type 2 diabetes mellitus with hyperosmolar nonketotic hyperglycemia ([place]) 10/06/2013  ? CKD (chronic kidney disease) stage 3, GFR 30-59 ml/min (HCC) 10/06/2013  ? Obesity 10/06/2013  ? GERD (gastroesophageal reflux disease) 10/06/2013  ? ?PCP:  Pcp, No ?Pharmacy:   ?[id number] - [place] [address] ?[address] ?[place] [address] ?Phone: [phone] Fax: [phone] ? ?  [facility] [address], [address] ?[place] [address] ?Phone: [phone] Fax: [phone] ? ?[id number] - [place] [place] [address] ?[address] ?[place] [address] ?Phone: [phone] Fax: [phone] ? ? ? ? ?Social Determinants of Health (SDOH) Interventions ?   ? ?Readmission Risk Interventions ?   ? View : No data to display.  ?  ?  ?  ? ? ? ?

## 2022-01-08 NOTE — Plan of Care (Signed)
Discussed with patient earlier in the shift plan of care for the evening, pain management and bedtime medications with some teach back displayed. ? ?Problem: Education: ?Goal: Ability to demonstrate management of disease process will improve ?Outcome: Progressing ?  ?

## 2022-01-08 NOTE — Progress Notes (Signed)
[facility] ?PROGRESS NOTE ? ? ? ?Progress Note  ?Eric Larson  [id number] DOB: 02/04/1961 DOA: 01/07/2022 ?PCP: Pcp, No  ? ? ? ?Brief Narrative:  ? ?Eric Larson is an 61 y.o. male past medical history of diabetes mellitus type 2 poorly controlled, BPH and hypothyroidism recently seen in the ED about 2 weeks prior to admission for shortness of breath was told he had heart failure placed on Lasix comes in with worsening orthopnea sleeping in the recliner cannot walk 100 feet. ?BNP was 2000, chest x-ray showed pulmonary edema. ? ? ? ? ?Assessment/Plan:  ? ?Acute heart failure ([place]) ?Continue IV Lasix, monitor potassium and replete as tolerated. ?Images fluid intake, strict I's and O's and daily weights. ?2D echo was sent which is pending. ?Cardiac biomarkers have remained basically flat. ?He appears significantly fluid overloaded ?He is negative about 700 cc. ?Given a single dose of metolazone ? ?Essential hypertension: ?Discontinue lisinopril seems benefit from [place]. ?Continue Lasix and low-dose Coreg. ? ?Type 2 diabetes mellitus with hyperosmolar nonketotic hyperglycemia ([place]) ?Continue to hold metformin. ?A1c of 7.6 on sliding scale insulin blood glucose still elevated add low-dose Lantus. ? ?Acute on CKD (chronic kidney disease) stage 3, GFR 30-59 ml/min (HCC) ?Last creatinine back in November 2022 was 1.2 on admission 1.6. ? ?DVT prophylaxis: lovenox ?Family Communication:none ?Status is: Inpatient ?Remains inpatient appropriate because: Acute unspecified heart failure ? ? ? ?Code Status:  ? ?  ?Code Status Orders  ?(From admission, onward)  ?  ? ? ?  ? ?  Start     Ordered  ? 01/07/22 1751  Full code  Continuous       ? 01/07/22 1750  ? ?  ?  ? ?  ? ?Code Status History   ? ? Date Active Date Inactive Code Status Order ID Comments User Context  ? 10/06/2013 0221 10/08/2013 1759 Full Code [id number]  Rise Patience, MD Inpatient  ? ?  ? ? ? ? ?IV Access:  ? ?Peripheral IV ? ? ?Procedures and  diagnostic studies:  ? ?DG Chest 2 View ? ?Result Date: 01/07/2022 ?CLINICAL DATA:  Shortness of breath EXAM: CHEST - 2 VIEW COMPARISON:  12/13/2021 FINDINGS: Similar cardiomegaly and vascular congestion, with mid and lower lung interstitial opacities, possibly mild pulmonary edema. Bibasilar atelectasis. No significant pleural effusion or pneumothorax. IMPRESSION: Cardiomegaly with likely mild pulmonary edema. Electronically Signed   By: Merilyn Baba M.D.   On: 01/07/2022 12:52   ? ? ?Medical Consultants:  ? ?None. ? ? ?Subjective:  ? ? ?[name] relates he did not breathe as he short of breath. ? ?Objective:  ? ? ?Vitals:  ? 01/07/22 2200 01/08/22 0146 01/08/22 0500 01/08/22 [id number]  ?BP: (!) 128/98 127/74  (!) 145/102  ?Pulse: 63 82  (!) 52  ?Resp: 20 20  16   ?Temp: 97.6 ?F (36.4 ?C) 98 ?F (36.7 ?C)  97.6 ?F (36.4 ?C)  ?TempSrc: Oral Oral  Oral  ?SpO2: 98% 97%  100%  ?Weight:   100.7 kg   ?Height:      ? ?SpO2: 100 % ? ? ?Intake/Output Summary (Last 24 hours) at 01/08/2022 0730 ?Last data filed at 01/08/2022 0600 ?Gross per 24 hour  ?Intake 770 ml  ?Output 1550 ml  ?Net -780 ml  ? ?Filed Weights  ? 01/07/22 1752 01/08/22 0500  ?Weight: 99.5 kg 100.7 kg  ? ? ?Exam: ?General exam: In no acute distress. ?Respiratory system: Good air movement and crackles at bases predominantly on the  right ?Cardiovascular system: S1 & S2 heard, RRR.  JVD to the earlobe ?Gastrointestinal system: Abdomen is nondistended, soft and nontender.  ?Extremities: 3+ pedal edema. ?Skin: No rashes, lesions or ulcers ?Psychiatry: Judgement and insight appear normal. Mood & affect appropriate.  ? ? ?Data Reviewed:  ? ? ?Labs: ?Basic Metabolic Panel: ?Recent Labs  ?Lab 01/07/22 ?1315 01/08/22 ?0352  ?NA 139 140  ?K 3.8 4.3  ?CL 110 108  ?CO2 20* 23  ?GLUCOSE 171* 173*  ?BUN 26* 30*  ?CREATININE 1.64* 1.85*  ?CALCIUM 8.6* 9.1  ? ?GFR ?Estimated Creatinine Clearance: 51.5 mL/min (A) (by C-G formula based on SCr of 1.85 mg/dL (H)). ?Liver Function  Tests: ?Recent Labs  ?Lab 01/07/22 ?1315  ?AST 56*  ?ALT 57*  ?ALKPHOS 40  ?BILITOT 1.0  ?PROT 7.4  ?ALBUMIN 3.6  ? ?No results for input(s): LIPASE, AMYLASE in the last 168 hours. ?No results for input(s): AMMONIA in the last 168 hours. ?Coagulation profile ?No results for input(s): INR, PROTIME in the last 168 hours. ?COVID-19 Labs ? ?No results for input(s): DDIMER, FERRITIN, LDH, CRP in the last 72 hours. ? ?Lab Results  ?Component Value Date  ? [place] NEGATIVE 02/08/2021  ? ? ?CBC: ?Recent Labs  ?Lab 01/07/22 ?1315  ?WBC 4.2  ?HGB 12.6*  ?HCT 38.1*  ?MCV 90.7  ?PLT 144*  ? ?Cardiac Enzymes: ?No results for input(s): CKTOTAL, CKMB, CKMBINDEX, TROPONINI in the last 168 hours. ?BNP (last 3 results) ?No results for input(s): PROBNP in the last 8760 hours. ?CBG: ?Recent Labs  ?Lab 01/07/22 ?1828 01/07/22 ?1956 01/07/22 ?2202 01/08/22 ?[id number]  ?GLUCAP 135* 250* 174* 177*  ? ?D-Dimer: ?No results for input(s): DDIMER in the last 72 hours. ?Hgb A1c: ?Recent Labs  ?  01/07/22 ?1902  ?HGBA1C 7.6*  ? ?Lipid Profile: ?No results for input(s): CHOL, HDL, LDLCALC, TRIG, CHOLHDL, LDLDIRECT in the last 72 hours. ?Thyroid function studies: ?No results for input(s): TSH, T4TOTAL, T3FREE, THYROIDAB in the last 72 hours. ? ?Invalid input(s): FREET3 ?Anemia work up: ?No results for input(s): VITAMINB12, FOLATE, FERRITIN, TIBC, IRON, RETICCTPCT in the last 72 hours. ?Sepsis Labs: ?Recent Labs  ?Lab 01/07/22 ?1315  ?WBC 4.2  ? ?Microbiology ?No results found for this or any previous visit (from the past 240 hour(s)). ? ? ?Medications:  ? ? aspirin EC  81 mg Oral Daily  ? carvedilol  3.125 mg Oral BID WC  ? enoxaparin (LOVENOX) injection  40 mg Subcutaneous Q24H  ? furosemide  60 mg Intravenous Q12H  ? insulin aspart  0-5 Units Subcutaneous QHS  ? insulin aspart  0-9 Units Subcutaneous TID WC  ? insulin aspart  3 Units Subcutaneous TID WC  ? potassium chloride  40 mEq Oral BID  ? sodium chloride flush  3 mL Intravenous Q12H   ? ?Continuous Infusions: ? sodium chloride    ? ? ? ? LOS: 1 day  ? ?Tammi Klippel Aileen Fass  ?[facility] ? ?01/08/2022, 7:30 AM  ? ? ? ? ? ? ?

## 2022-01-09 LAB — HEMOGLOBIN A1C
Hgb A1c MFr Bld: 7.6 % — ABNORMAL HIGH (ref 4.8–5.6)
Mean Plasma Glucose: 171.42 mg/dL

## 2022-01-09 LAB — BASIC METABOLIC PANEL
Anion gap: 12 (ref 5–15)
Anion gap: 10 (ref 5–15)
BUN: 35 mg/dL — ABNORMAL HIGH (ref 8–23)
BUN: 38 mg/dL — ABNORMAL HIGH (ref 8–23)
CO2: 22 mmol/L (ref 22–32)
CO2: 27 mmol/L (ref 22–32)
Calcium: 9 mg/dL (ref 8.9–10.3)
Calcium: 9.6 mg/dL (ref 8.9–10.3)
Chloride: 103 mmol/L (ref 98–111)
Chloride: 100 mmol/L (ref 98–111)
Creatinine, Ser: 1.83 mg/dL — ABNORMAL HIGH (ref 0.61–1.24)
Creatinine, Ser: 2.03 mg/dL — ABNORMAL HIGH (ref 0.61–1.24)
GFR, Estimated: 41 mL/min — ABNORMAL LOW (ref >60)
GFR, Estimated: 37 mL/min — ABNORMAL LOW (ref >60)
Glucose, Bld: 181 mg/dL — ABNORMAL HIGH (ref 70–99)
Glucose, Bld: 197 mg/dL — ABNORMAL HIGH (ref 70–99)
Potassium: 3.8 mmol/L (ref 3.5–5.1)
Potassium: 3.8 mmol/L (ref 3.5–5.1)
Sodium: 137 mmol/L (ref 135–145)
Sodium: 137 mmol/L (ref 135–145)

## 2022-01-09 LAB — GLUCOSE, CAPILLARY
Glucose-Capillary: 159 mg/dL — ABNORMAL HIGH (ref 70–99)
Glucose-Capillary: 239 mg/dL — ABNORMAL HIGH (ref 70–99)
Glucose-Capillary: 148 mg/dL — ABNORMAL HIGH (ref 70–99)
Glucose-Capillary: 201 mg/dL — ABNORMAL HIGH (ref 70–99)

## 2022-01-09 LAB — TSH: TSH: 15.069 u[IU]/mL — ABNORMAL HIGH (ref 0.350–4.500)

## 2022-01-09 LAB — T4, FREE: Free T4: 0.84 ng/dL (ref 0.61–1.12)

## 2022-01-09 MED ORDER — SODIUM CHLORIDE 0.9% FLUSH: 3.0000 mL | INTRAVENOUS | Status: DC | PRN

## 2022-01-09 MED ORDER — FUROSEMIDE 10 MG/ML IJ SOLN
120.0000 mg | Freq: Two times a day (BID) | INTRAVENOUS | Status: DC
  Filled 2022-01-09: qty 12

## 2022-01-09 MED ORDER — SODIUM CHLORIDE 0.9 % IV SOLN: INTRAVENOUS | Status: DC

## 2022-01-09 MED ORDER — SODIUM CHLORIDE 0.9% FLUSH
3.0000 mL | Freq: Two times a day (BID) | INTRAVENOUS | Status: DC
  Administered 2022-01-09 – 2022-01-12 (×2): 3 mL via INTRAVENOUS

## 2022-01-09 MED ORDER — ASPIRIN EC 81 MG PO TBEC
81.0000 mg | DELAYED_RELEASE_TABLET | Freq: Every day | ORAL | Status: DC
  Administered 2022-01-11 – 2022-01-13 (×3): 81 mg via ORAL
  Filled 2022-01-09 (×3): qty 1

## 2022-01-09 MED ORDER — HYDRALAZINE HCL 25 MG PO TABS
25.0000 mg | ORAL_TABLET | Freq: Three times a day (TID) | ORAL | Status: DC
  Administered 2022-01-09 – 2022-01-13 (×12): 25 mg via ORAL
  Filled 2022-01-09 (×12): qty 1

## 2022-01-09 MED ORDER — ASPIRIN 81 MG PO CHEW
81.0000 mg | CHEWABLE_TABLET | ORAL | Status: AC
  Administered 2022-01-10: 81 mg via ORAL
  Filled 2022-01-09: qty 1

## 2022-01-09 MED ORDER — CARVEDILOL 6.25 MG PO TABS: 6.2500 mg | ORAL_TABLET | Freq: Two times a day (BID) | ORAL | Status: DC

## 2022-01-09 MED ORDER — SODIUM CHLORIDE 0.9 % IV SOLN: 250.0000 mL | INTRAVENOUS | Status: DC | PRN

## 2022-01-09 MED ORDER — SACUBITRIL-VALSARTAN 49-51 MG PO TABS
1.0000 | ORAL_TABLET | Freq: Two times a day (BID) | ORAL | Status: DC
  Filled 2022-01-09: qty 1

## 2022-01-09 MED ORDER — LEVOTHYROXINE SODIUM 75 MCG PO TABS
75.0000 ug | ORAL_TABLET | Freq: Every day | ORAL | Status: DC
  Administered 2022-01-09 – 2022-01-13 (×5): 75 ug via ORAL
  Filled 2022-01-09 (×5): qty 1

## 2022-01-09 NOTE — Progress Notes (Signed)
?[facility] Progress Note ? ?Patient: Eric Larson     ?[id number]: 01/07/2022   ?PCP: Pcp, No  ? ?  ?  ?Brief hospital course: ?This is a 61 year old male with diabetes mellitus that is poorly controlled, hypothyroidism and BPH who presents to the hospital for shortness of breath.  He was recently admitted for the same about 2 weeks before.  The patient stated that he had shortness of breath on exertion and was noted to have severe pedal edema in the ED.  He was also noted to have a pulse ox of 95% which dropped down to 84% ambulating, a chest x-ray that revealed pulmonary edema and a BNP of 1000.  The patient was admitted for acute congestive heart failure and started on IV Lasix.  A 2D echo was attained which revealed that he had an EF of 15-25 percent, moderate to severe mitral regurgitation  ?Subjective:  ?RV failure shortness of breath and pedal edema have improved today. ? ?Assessment and Plan: ?Principal Problem: ?  Acute heart failure ([place]) ?-New diagnosis of systolic heart failure with RV failure as well ?- Consulted cardiology as he will most likely need a left and right heart cath along with an evaluation for his mitral regurgitation ?- We will continue IV Lasix today ?- I have involved the care manager as well since he currently has no insurance ?-I will also obtain a fasting lipid panel ? ?Active Problems: ?  Type 2 diabetes mellitus with hyperosmolar nonketotic hyperglycemia (HCC) ?-A1c is 7.6 ?- Apparently he has not been taking his medications at home ?-Current glucoses ranging from greatest to 300 range ?-We will need to initiate Jardiance prior to discharge ? ?Hypothyroidism ?- TSH noted to be 15.069 ?- Free T4 has been ordered and is pending next ?-I have resumed his home dose of Synthroid ? ?  CKD (chronic kidney disease) stage 3, GFR 30-59 ml/min (HCC) ?-Follow creatinine while diuresing ? ?  Essential hypertension ?-Will need to start a beta-blocker and Entresto ?  ?DVT  prophylaxis: Lovenox ?  Code Status: Full Code  ?Consultants: Cardiology ?Level of Care: Level of care: Telemetry ?Disposition Plan:  ?Status is: Inpatient ?Remains inpatient appropriate because: Needs further work-up issues ? ?Objective: ?  ?Vitals:  ? 01/08/22 1957 01/09/22 0412 01/09/22 0451 01/09/22 1329  ?BP: (!) 124/93 (!) 137/99  (!) 131/94  ?Pulse: (!) 59 81  (!) 52  ?Resp: 20 20  (!) 22  ?Temp: 98 ?F (36.7 ?C) 98.4 ?F (36.9 ?C)  97.6 ?F (36.4 ?C)  ?TempSrc: Oral Oral  Oral  ?SpO2: 100% 94%  100%  ?Weight:   100.7 kg   ?Height:      ? ?Filed Weights  ? 01/07/22 1752 01/08/22 0500 01/09/22 0451  ?Weight: 99.5 kg 100.7 kg 100.7 kg  ? ?Exam: ?General exam: Appears comfortable  ?HEENT: PERRLA, oral mucosa moist, no sclera icterus or thrush ?Respiratory system: Clear to auscultation. Respiratory effort normal. ?Cardiovascular system: S1 & S2 heard, regular rate and rhythm ?Gastrointestinal system: Abdomen soft, non-tender, nondistended. Normal bowel sounds   ?Central nervous system: Alert and oriented. No focal neurological deficits. ?Extremities: No cyanosis, clubbing or edema ?Skin: No rashes or ulcers ?Psychiatry:  Mood & affect appropriate.   ? ?Imaging and lab data was personally reviewed ? ? ? CBC: ?Recent Labs  ?Lab 01/07/22 ?1315  ?WBC 4.2  ?HGB 12.6*  ?HCT 38.1*  ?MCV 90.7  ?PLT 144*  ? ?Basic Metabolic Panel: ?Recent Labs  ?Lab 01/07/22 ?  1315 01/08/22 ?0352 01/09/22 ?[id number]  ?NA 139 140 137  ?K 3.8 4.3 3.8  ?CL 110 108 103  ?CO2 20* 23 22  ?GLUCOSE 171* 173* 181*  ?BUN 26* 30* 35*  ?CREATININE 1.64* 1.85* 1.83*  ?CALCIUM 8.6* 9.1 9.0  ? ?GFR: ?Estimated Creatinine Clearance: 52 mL/min (A) (by C-G formula based on SCr of 1.83 mg/dL (H)). ? ?Scheduled Meds: ? aspirin EC  81 mg Oral Daily  ? carvedilol  3.125 mg Oral BID WC  ? enoxaparin (LOVENOX) injection  40 mg Subcutaneous Q24H  ? furosemide  80 mg Intravenous Q12H  ? insulin aspart  0-5 Units Subcutaneous QHS  ? insulin aspart  0-9 Units Subcutaneous  TID WC  ? insulin aspart  4 Units Subcutaneous TID WC  ? insulin detemir  5 Units Subcutaneous Daily  ? sodium chloride flush  3 mL Intravenous Q12H  ? ?Continuous Infusions: ? sodium chloride    ? ? ? LOS: 2 days  ? ?Author: ?Debbe Odea  ?01/09/2022 2:04 PM ?   ?

## 2022-01-09 NOTE — Consult Note (Signed)
?Cardiology Consult:  ? ?Patient ID: Eric Larson ?MRN: [id number]; DOB: 1961/05/26  ? ?Admission date: 01/07/2022 ? ?PCP:  Pcp, No ?  ?[facility] Providers ?Cardiologist:  None      ? ?Chief Complaint: New HF and Severe Functional mitral regurgitation ? ?Patient Profile:  ? ?Eric Larson is a 61 y.o. 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 ? ?History of Present Illness:  ? ?Eric Larson notes that he has had no changes in his health until the past two week.  Notes that he had new left eye blindness in [id number] but otherwise was doing well (he is unsure if he had a stroke, but notes his A1c was 7.5 in the past; documentation from ophthalmology [facility] shows evidence of retinal hemorrhage and A1c 10.8). hx of prior thyroid surgery, he can't tell me clearly that he takes levothyroxine  Maintained his baseline weight of 205 lbs and was still working as a Dealer. ? ?On 12/13/21 saw [facility] for HTN and SODE.  He was give 40 IV lasix with apparent resolution of sx.  He had outpatient follow up scheduled but not performed.  Weight 210 lbs at that ED eval. ? ?Patient notes that he is feeling terrible for the past 2 weeks.  Has had no chest pain, chest pressure, chest tightness, chest stinging .  Discomfort occurs with trying to work as a Dealer, in the form of shortness of breath, to the point that he had to stop working.  Notes shortness of breath at rest.  Noted PND and orthopnea.  Notes weight gain (he is 223 lbs today), leg swelling and abdominal swelling.  No syncope or near syncope . Notes  no palpitations or funny heart beats.    ? ?ED eval 01/08/22:  ?EKG SR with LVH and secondary repolarization; listed as AF but there are P waves on 01/07/22 study ?CXR notable for cardiomegaly ? ?BNP 1116 (improved form 12/13/21 1224) ?TSH 15 Free T4 0.84 ? ?Echo: Severe biventricular failure, ventricular functional, Severe  MR ? ?Tele: SR with frequent PVCs ? ?Started on Insulin, ASA 81 mg PO daily, synthroid, metolazone 2.5 X1, lasix 80 I, coreg. ? ?He is out 4.5 L since 01/07/22 and feels much better. ? ?He does not drink or smoke. ?His care is complicated by lack of insurance ? ? ?Past Medical History:  ?Diagnosis Date  ? Acid reflux   ? Diabetes mellitus without complication ([place])   ? Gout   ? ? ?Past Surgical History:  ?Procedure Laterality Date  ? THYROID SURGERY    ?  ? ?Medications Prior to Admission: ?Prior to Admission medications   ?Medication Sig Start Date End Date Taking? Authorizing Provider  ?furosemide (LASIX) 40 MG tablet Take 40 mg by mouth daily. 12/13/21  Yes [provider]  ?metFORMIN (GLUCOPHAGE) 1000 MG tablet Take 1,000 mg by mouth 2 (two) times daily with a meal.   Yes [provider]  ?Blood Glucose Monitoring Suppl (TRUE METRIX METER) W/DEVICE KIT 1 each by Does not apply route 3 (three) times daily. 08/25/15   Tresa Garter, MD  ?gemfibrozil (LOPID) 600 MG tablet Take 1 tablet (600 mg total) by mouth 2 (two) times daily before a meal. ?Patient not taking: Reported on 01/07/2022 09/06/16 02/09/22  Alfonse Spruce, FNP  ?glucose blood test strip Use as instructed 11/18/16   Alfonse Spruce, FNP  ?levothyroxine (SYNTHROID, LEVOTHROID) 75 MCG  tablet Take 1 tablet (75 mcg total) by mouth daily. ?Patient not taking: Reported on 01/07/2022 09/06/16   Alfonse Spruce, FNP  ?lisinopril (PRINIVIL,ZESTRIL) 10 MG tablet Take 1 tablet (10 mg total) by mouth daily. ?Patient not taking: Reported on 01/07/2022 11/13/16   Alfonse Spruce, FNP  ?metFORMIN (GLUCOPHAGE) 500 MG tablet Take 1 tablet (500 mg total) by mouth 2 (two) times daily with a meal. ?Patient not taking: Reported on 01/07/2022 09/04/16   Alfonse Spruce, FNP  ?oxyCODONE-acetaminophen (PERCOCET/ROXICET) 5-325 MG tablet Take 1 tablet by mouth every 4 (four) hours as needed for severe pain. ?Patient not taking: Reported on  01/07/2022 01/06/18   Okey Regal, PA-C  ?tamsulosin (FLOMAX) 0.4 MG CAPS capsule Take 1 capsule (0.4 mg total) by mouth daily. ?Patient not taking: Reported on 01/07/2022 01/06/18   Okey Regal, PA-C  ?  ? ?Allergies:   No Known Allergies ? ?Social History:   ?Social History  ? ?Socioeconomic History  ? Marital status: Single  ?  Spouse name: Not on file  ? Number of children: Not on file  ? Years of education: Not on file  ? Highest education level: Not on file  ?Occupational History  ? Not on file  ?Tobacco Use  ? Smoking status: Never  ? Smokeless tobacco: Never  ?Substance and Sexual Activity  ? Alcohol use: No  ? Drug use: No  ? Sexual activity: Yes  ?Other Topics Concern  ? Not on file  ?Social History Narrative  ? Not on file  ? ?Social Determinants of Health  ? ?Financial Resource Strain: Not on file  ?Food Insecurity: Not on file  ?Transportation Needs: Not on file  ?Physical Activity: Not on file  ?Stress: Not on file  ?Social Connections: Not on file  ?Intimate Partner Violence: Not on file  ?  ?Family History:   ?The patient's family history includes Heart attack in his father. There is no history of Diabetes Mellitus II or CAD.   ?No family history of HF. ? ?ROS:  ?Please see the history of present illness.  ?All other ROS reviewed and negative.    ? ?Physical Exam/Data:  ? ?Vitals:  ? 01/08/22 1957 01/09/22 0412 01/09/22 0451 01/09/22 1329  ?BP: (!) 124/93 (!) 137/99  (!) 131/94  ?Pulse: (!) 59 81  (!) 52  ?Resp: 20 20  (!) 22  ?Temp: 98 ?F (36.7 ?C) 98.4 ?F (36.9 ?C)  97.6 ?F (36.4 ?C)  ?TempSrc: Oral Oral  Oral  ?SpO2: 100% 94%  100%  ?Weight:   100.7 kg   ?Height:      ? ? ?Intake/Output Summary (Last 24 hours) at 01/09/2022 1503 ?Last data filed at 01/09/2022 1210 ?Gross per 24 hour  ?Intake 433 ml  ?Output 3200 ml  ?Net -2767 ml  ? ? ?  01/09/2022  ?  4:51 AM 01/08/2022  ?  5:00 AM 01/07/2022  ?  5:52 PM  ?Last 3 Weights  ?Weight (lbs) 222 lb 0.1 oz 222 lb 0.1 oz 219 lb 5.7 oz  ?Weight (kg) 100.7 kg  100.7 kg 99.5 kg  ?   ?Body mass index is 30.11 kg/m?.  ? ?Gen: no distress   ?Neck: Elevated JVD, with HJR into his jaw ?Cardiac: No Rubs or Gallops, 3/6 holosystolic murmur, RRR, +2 radial pulses ?Respiratory: Clear to auscultation bilaterally, normal effort, normal  respiratory rate ?GI: Soft, nontender, non-distended  ?MS: +1 bilateral  edema;  moves all extremities ?Integument: Skin feels warm ?Neuro:  At time of evaluation, alert and oriented to person/place/time/situation  ?Psych: Normal affect, patient feels well ? ? ?Laboratory Data: ? ?High Sensitivity Troponin:   ?Recent Labs  ?Lab 01/07/22 ?1315 01/07/22 ?1443  ?TROPONINIHS 27* 29*  ?    ?Chemistry ?Recent Labs  ?Lab 01/08/22 ?0352 01/09/22 ?7711  ?NA 140 137  ?K 4.3 3.8  ?CL 108 103  ?CO2 23 22  ?GLUCOSE 173* 181*  ?BUN 30* 35*  ?CREATININE 1.85* 1.83*  ?CALCIUM 9.1 9.0  ?GFRNONAA 41* 41*  ?ANIONGAP 9 12  ?  ?Recent Labs  ?Lab 01/07/22 ?1315  ?PROT 7.4  ?ALBUMIN 3.6  ?AST 56*  ?ALT 57*  ?ALKPHOS 40  ?BILITOT 1.0  ? ?Lipids No results for input(s): CHOL, TRIG, HDL, LABVLDL, LDLCALC, CHOLHDL in the last 168 hours. ?Hematology ?Recent Labs  ?Lab 01/07/22 ?1315  ?WBC 4.2  ?RBC 4.20*  ?HGB 12.6*  ?HCT 38.1*  ?MCV 90.7  ?MCH 30.0  ?MCHC 33.1  ?RDW 14.9  ?PLT 144*  ? ?Thyroid  ?Recent Labs  ?Lab 01/09/22 ?1156  ?TSH 15.069*  ?FREET4 0.84  ? ?BNP ?Recent Labs  ?Lab 01/07/22 ?1315  ?BNP 1,116.3*  ?  ?DDimer No results for input(s): DDIMER in the last 168 hours. ? ? ?Radiology/Studies:  ?No results found. ? ? ?Assessment and Plan:  ? ?Heart Failure Reduced Ejection Fraction  ?Biventricular Heart Failure, Acute on Chronic ?Complicated by controlled diabetes ?Complicated by untreated hypothyroidism ?Complicated by likely progression of CKD vs AKI ?Complicated by lack of insurance (social determinants of health) ?- with mild normocytic anemia ?With severe ventricular functional mitral regurgitation ?- NYHA class IV, Stage D, hypervolemic, etiology unclear ?-  Diuretic regimen: Lasix 120 mg IV X 1; post procedure will likely return standing diuretics  for 01/10/22 (potentially with milrinone) ?- Discussed the importance of fluid restriction of < 1.2 L, salt restriction, and chec

## 2022-01-10 ENCOUNTER — Inpatient Hospital Stay (HOSPITAL_COMMUNITY)
Admission: EM | Disposition: A | Discharge status: Home Health | Payer: Self-pay | Source: Home / Self Care | Attending: Cardiology

## 2022-01-10 ENCOUNTER — Encounter (HOSPITAL_COMMUNITY): Payer: Self-pay | Admitting: Internal Medicine

## 2022-01-10 ENCOUNTER — Inpatient Hospital Stay: Payer: Self-pay

## 2022-01-10 DIAGNOSIS — I5021 Acute systolic (congestive) heart failure: Secondary | ICD-10-CM

## 2022-01-10 DIAGNOSIS — I2510 Atherosclerotic heart disease of native coronary artery without angina pectoris: Secondary | ICD-10-CM

## 2022-01-10 HISTORY — PX: RIGHT/LEFT HEART CATH AND CORONARY ANGIOGRAPHY: CATH118266

## 2022-01-10 LAB — POCT I-STAT EG7
Acid-Base Excess: 4 mmol/L — ABNORMAL HIGH (ref 0.0–2.0)
Acid-Base Excess: 5 mmol/L — ABNORMAL HIGH (ref 0.0–2.0)
Bicarbonate: 29.9 mmol/L — ABNORMAL HIGH (ref 20.0–28.0)
Bicarbonate: 30.7 mmol/L — ABNORMAL HIGH (ref 20.0–28.0)
Calcium, Ion: 1.23 mmol/L (ref 1.15–1.40)
Calcium, Ion: 1.29 mmol/L (ref 1.15–1.40)
HCT: 38 % — ABNORMAL LOW (ref 39.0–52.0)
HCT: 38 % — ABNORMAL LOW (ref 39.0–52.0)
Hemoglobin: 12.9 g/dL — ABNORMAL LOW (ref 13.0–17.0)
Hemoglobin: 12.9 g/dL — ABNORMAL LOW (ref 13.0–17.0)
O2 Saturation: 57 %
O2 Saturation: 59 %
Potassium: 3.8 mmol/L (ref 3.5–5.1)
Potassium: 4 mmol/L (ref 3.5–5.1)
Sodium: 138 mmol/L (ref 135–145)
Sodium: 139 mmol/L (ref 135–145)
TCO2: 31 mmol/L (ref 22–32)
TCO2: 32 mmol/L (ref 22–32)
pCO2, Ven: 47 mmHg (ref 44–60)
pCO2, Ven: 47.4 mmHg (ref 44–60)
pH, Ven: 7.412 (ref 7.25–7.43)
pH, Ven: 7.419 (ref 7.25–7.43)
pO2, Ven: 30 mmHg — CL (ref 32–45)
pO2, Ven: 31 mmHg — CL (ref 32–45)

## 2022-01-10 LAB — GLUCOSE, CAPILLARY
Glucose-Capillary: 167 mg/dL — ABNORMAL HIGH (ref 70–99)
Glucose-Capillary: 172 mg/dL — ABNORMAL HIGH (ref 70–99)
Glucose-Capillary: 159 mg/dL — ABNORMAL HIGH (ref 70–99)
Glucose-Capillary: 183 mg/dL — ABNORMAL HIGH (ref 70–99)
Glucose-Capillary: 286 mg/dL — ABNORMAL HIGH (ref 70–99)

## 2022-01-10 LAB — CREATININE, SERUM
Creatinine, Ser: 1.82 mg/dL — ABNORMAL HIGH (ref 0.61–1.24)
GFR, Estimated: 42 mL/min — ABNORMAL LOW (ref >60)

## 2022-01-10 LAB — BASIC METABOLIC PANEL
Anion gap: 11 (ref 5–15)
BUN: 42 mg/dL — ABNORMAL HIGH (ref 8–23)
CO2: 27 mmol/L (ref 22–32)
Calcium: 9.6 mg/dL (ref 8.9–10.3)
Chloride: 99 mmol/L (ref 98–111)
Creatinine, Ser: 1.83 mg/dL — ABNORMAL HIGH (ref 0.61–1.24)
GFR, Estimated: 41 mL/min — ABNORMAL LOW (ref >60)
Glucose, Bld: 152 mg/dL — ABNORMAL HIGH (ref 70–99)
Potassium: 3.4 mmol/L — ABNORMAL LOW (ref 3.5–5.1)
Sodium: 137 mmol/L (ref 135–145)

## 2022-01-10 LAB — CBC
HCT: 40.3 % (ref 39.0–52.0)
Hemoglobin: 13.8 g/dL (ref 13.0–17.0)
MCH: 30.2 pg (ref 26.0–34.0)
MCHC: 34.2 g/dL (ref 30.0–36.0)
MCV: 88.2 fL (ref 80.0–100.0)
Platelets: 161 10*3/uL (ref 150–400)
RBC: 4.57 MIL/uL (ref 4.22–5.81)
RDW: 14.2 % (ref 11.5–15.5)
WBC: 4.3 10*3/uL (ref 4.0–10.5)
nRBC: 0 % (ref 0.0–0.2)

## 2022-01-10 LAB — LIPID PANEL
Cholesterol: 197 mg/dL (ref 0–200)
HDL: 24 mg/dL — ABNORMAL LOW (ref >40)
LDL Cholesterol: 112 mg/dL — ABNORMAL HIGH (ref 0–99)
Total CHOL/HDL Ratio: 8.2 RATIO
Triglycerides: 307 mg/dL — ABNORMAL HIGH (ref <150)
VLDL: 61 mg/dL — ABNORMAL HIGH (ref 0–40)

## 2022-01-10 LAB — IRON AND TIBC
Iron: 69 ug/dL (ref 45–182)
Saturation Ratios: 21 % (ref 17.9–39.5)
TIBC: 336 ug/dL (ref 250–450)
UIBC: 267 ug/dL

## 2022-01-10 LAB — FERRITIN: Ferritin: 99 ng/mL (ref 24–336)

## 2022-01-10 SURGERY — RIGHT/LEFT HEART CATH AND CORONARY ANGIOGRAPHY
Anesthesia: LOCAL

## 2022-01-10 MED ORDER — FUROSEMIDE 10 MG/ML IJ SOLN
10.0000 mg/h | INTRAVENOUS | Status: DC
  Administered 2022-01-10 – 2022-01-11 (×2): 10 mg/h via INTRAVENOUS
  Filled 2022-01-10 (×2): qty 20

## 2022-01-10 MED ORDER — MIDAZOLAM HCL 2 MG/2ML IJ SOLN
INTRAMUSCULAR | Status: DC | PRN
  Administered 2022-01-10: 1 mg via INTRAVENOUS

## 2022-01-10 MED ORDER — ENOXAPARIN SODIUM 40 MG/0.4ML IJ SOSY
40.0000 mg | PREFILLED_SYRINGE | INTRAMUSCULAR | Status: DC
  Administered 2022-01-11 – 2022-01-13 (×3): 40 mg via SUBCUTANEOUS
  Filled 2022-01-10 (×3): qty 0.4

## 2022-01-10 MED ORDER — SODIUM CHLORIDE 0.9% FLUSH: 10.0000 mL | INTRAVENOUS | Status: DC | PRN

## 2022-01-10 MED ORDER — LIDOCAINE HCL (PF) 1 % IJ SOLN
INTRAMUSCULAR | Status: DC | PRN
  Administered 2022-01-10 (×2): 2 mL

## 2022-01-10 MED ORDER — IOHEXOL 350 MG/ML SOLN
INTRAVENOUS | Status: DC | PRN
  Administered 2022-01-10: 45 mL

## 2022-01-10 MED ORDER — HYDRALAZINE HCL 20 MG/ML IJ SOLN: 10.0000 mg | INTRAMUSCULAR | Status: AC | PRN

## 2022-01-10 MED ORDER — VERAPAMIL HCL 2.5 MG/ML IV SOLN
INTRAVENOUS | Status: DC | PRN
  Administered 2022-01-10: 10 mL via INTRA_ARTERIAL

## 2022-01-10 MED ORDER — FUROSEMIDE 10 MG/ML IJ SOLN
60.0000 mg | Freq: Once | INTRAMUSCULAR | Status: AC
  Administered 2022-01-10: 60 mg via INTRAVENOUS

## 2022-01-10 MED ORDER — MILRINONE LACTATE IN DEXTROSE 20-5 MG/100ML-% IV SOLN
0.1250 ug/kg/min | INTRAVENOUS | Status: DC
  Administered 2022-01-10 – 2022-01-11 (×2): 0.25 ug/kg/min via INTRAVENOUS
  Administered 2022-01-11: 0.125 ug/kg/min via INTRAVENOUS
  Filled 2022-01-10 (×3): qty 100

## 2022-01-10 MED ORDER — MIDAZOLAM HCL 2 MG/2ML IJ SOLN
INTRAMUSCULAR | Status: AC
  Filled 2022-01-10: qty 2

## 2022-01-10 MED ORDER — LABETALOL HCL 5 MG/ML IV SOLN: 10.0000 mg | INTRAVENOUS | Status: AC | PRN

## 2022-01-10 MED ORDER — HEPARIN (PORCINE) IN NACL 1000-0.9 UT/500ML-% IV SOLN
INTRAVENOUS | Status: AC
  Filled 2022-01-10: qty 500

## 2022-01-10 MED ORDER — SODIUM CHLORIDE 0.9% FLUSH
3.0000 mL | Freq: Two times a day (BID) | INTRAVENOUS | Status: DC
  Administered 2022-01-10 – 2022-01-12 (×4): 3 mL via INTRAVENOUS

## 2022-01-10 MED ORDER — VERAPAMIL HCL 2.5 MG/ML IV SOLN
INTRAVENOUS | Status: AC
  Filled 2022-01-10: qty 2

## 2022-01-10 MED ORDER — POTASSIUM CHLORIDE CRYS ER 20 MEQ PO TBCR
40.0000 meq | EXTENDED_RELEASE_TABLET | Freq: Two times a day (BID) | ORAL | Status: DC
Start: 2022-01-10 — End: 2022-01-10

## 2022-01-10 MED ORDER — POTASSIUM CHLORIDE CRYS ER 20 MEQ PO TBCR
40.0000 meq | EXTENDED_RELEASE_TABLET | Freq: Once | ORAL | Status: AC
  Administered 2022-01-10: 40 meq via ORAL
  Filled 2022-01-10: qty 2

## 2022-01-10 MED ORDER — SODIUM CHLORIDE 0.9 % IV SOLN: 250.0000 mL | INTRAVENOUS | Status: DC | PRN

## 2022-01-10 MED ORDER — SPIRONOLACTONE 12.5 MG HALF TABLET
12.5000 mg | ORAL_TABLET | Freq: Every day | ORAL | Status: DC
Start: 2022-01-10 — End: 2022-01-12
  Administered 2022-01-10 – 2022-01-12 (×3): 12.5 mg via ORAL
  Filled 2022-01-10 (×3): qty 1

## 2022-01-10 MED ORDER — ONDANSETRON HCL 4 MG/2ML IJ SOLN: 4.0000 mg | Freq: Four times a day (QID) | INTRAMUSCULAR | Status: DC | PRN

## 2022-01-10 MED ORDER — FENTANYL CITRATE (PF) 100 MCG/2ML IJ SOLN
INTRAMUSCULAR | Status: DC | PRN
  Administered 2022-01-10: 25 ug via INTRAVENOUS

## 2022-01-10 MED ORDER — HEPARIN (PORCINE) IN NACL 1000-0.9 UT/500ML-% IV SOLN
INTRAVENOUS | Status: DC | PRN
  Administered 2022-01-10 (×3): 500 mL

## 2022-01-10 MED ORDER — DIGOXIN 125 MCG PO TABS
0.1250 mg | ORAL_TABLET | Freq: Every day | ORAL | Status: DC
  Administered 2022-01-10 – 2022-01-13 (×4): 0.125 mg via ORAL
  Filled 2022-01-10 (×4): qty 1

## 2022-01-10 MED ORDER — SODIUM CHLORIDE 0.9% FLUSH
10.0000 mL | Freq: Two times a day (BID) | INTRAVENOUS | Status: DC
  Administered 2022-01-10: 10 mL
  Administered 2022-01-11: 20 mL
  Administered 2022-01-11 – 2022-01-13 (×4): 10 mL

## 2022-01-10 MED ORDER — CHLORHEXIDINE GLUCONATE CLOTH 2 % EX PADS
6.0000 | MEDICATED_PAD | Freq: Every day | CUTANEOUS | Status: DC
  Administered 2022-01-10 – 2022-01-13 (×3): 6 via TOPICAL

## 2022-01-10 MED ORDER — FUROSEMIDE 10 MG/ML IJ SOLN
INTRAMUSCULAR | Status: AC
  Filled 2022-01-10: qty 8

## 2022-01-10 MED ORDER — ISOSORBIDE MONONITRATE ER 30 MG PO TB24
30.0000 mg | ORAL_TABLET | Freq: Every day | ORAL | Status: DC
  Administered 2022-01-10 – 2022-01-13 (×4): 30 mg via ORAL
  Filled 2022-01-10 (×4): qty 1

## 2022-01-10 MED ORDER — FENTANYL CITRATE (PF) 100 MCG/2ML IJ SOLN
INTRAMUSCULAR | Status: AC
  Filled 2022-01-10: qty 2

## 2022-01-10 MED ORDER — HEPARIN SODIUM (PORCINE) 1000 UNIT/ML IJ SOLN
INTRAMUSCULAR | Status: DC | PRN
Start: 2022-01-10 — End: 2022-01-10
  Administered 2022-01-10: 5000 [IU] via INTRAVENOUS

## 2022-01-10 MED ORDER — ACETAMINOPHEN 325 MG PO TABS: 650.0000 mg | ORAL_TABLET | ORAL | Status: DC | PRN

## 2022-01-10 MED ORDER — LIDOCAINE HCL (PF) 1 % IJ SOLN
INTRAMUSCULAR | Status: AC
  Filled 2022-01-10: qty 30

## 2022-01-10 MED ORDER — SODIUM CHLORIDE 0.9% FLUSH
3.0000 mL | INTRAVENOUS | Status: DC | PRN
  Administered 2022-01-11: 3 mL via INTRAVENOUS

## 2022-01-10 MED ORDER — ROSUVASTATIN CALCIUM 20 MG PO TABS
20.0000 mg | ORAL_TABLET | Freq: Every day | ORAL | Status: DC
  Administered 2022-01-10 – 2022-01-13 (×4): 20 mg via ORAL
  Filled 2022-01-10 (×4): qty 1

## 2022-01-10 MED ORDER — HEPARIN SODIUM (PORCINE) 1000 UNIT/ML IJ SOLN
INTRAMUSCULAR | Status: AC
  Filled 2022-01-10: qty 10

## 2022-01-10 MED ORDER — SODIUM CHLORIDE 0.9 % IV SOLN
INTRAVENOUS | Status: AC | PRN
  Administered 2022-01-10: 10 mL/h via INTRAVENOUS

## 2022-01-10 SURGICAL SUPPLY — 12 items
CATH 5FR JL3.5 JR4 ANG PIG MP (CATHETERS) ×1 IMPLANT
CATH BALLN WEDGE 5F 110CM (CATHETERS) ×1 IMPLANT
DEVICE RAD COMP TR BAND LRG (VASCULAR PRODUCTS) ×1 IMPLANT
GLIDESHEATH SLEND SS 6F .021 (SHEATH) ×1 IMPLANT
GUIDEWIRE .025 260CM (WIRE) ×1 IMPLANT
GUIDEWIRE INQWIRE 1.5J.035X260 (WIRE) IMPLANT
INQWIRE 1.5J .035X260CM (WIRE) ×2
KIT HEART LEFT (KITS) ×3 IMPLANT
PACK CARDIAC CATHETERIZATION (CUSTOM PROCEDURE TRAY) ×3 IMPLANT
SHEATH GLIDE SLENDER 4/5FR (SHEATH) ×1 IMPLANT
SHEATH PROBE COVER 6X72 (BAG) ×1 IMPLANT
TRANSDUCER W/STOPCOCK (MISCELLANEOUS) ×3 IMPLANT

## 2022-01-10 NOTE — Plan of Care (Signed)
Transfer of Care Note ? ? ?I have reviewed the cath report and the plans for PICC and ionotropes. I have requested Dr Aundra Dubin if his team would take over care as attending and he has agreed. [facility] will sign off.  ? ?Debbe Odea, MD ?

## 2022-01-10 NOTE — TOC Initial Note (Addendum)
Transition of Care (TOC) - Initial/Assessment Note  ? ? ?Patient Details  ?Name: Eric Larson ?MRN: [id number] ?Date of Birth: 28-May-1961 ? ?Transition of Care [facility]) CM/SW Contact:    ?Marcheta Grammes Rexene Alberts, RN ?Phone Number: [phone]  ?01/10/2022, 3:30 PM ? ?Clinical Narrative:                 ? ?HF TOC CM spoke to pt and gave permission to speak to girlfriend, Rod Holler. Pt states he will be staying with her in [place] for a few weeks once he is dc. Pt will need MATCH/HF funds for meds. He goes to [facility] in [place], sees, Irven Shelling FNP. Rod Holler states she will pick up a scale today. Contacted [facility] rep, Marjory Lies and they are following for [facility]. Will need HHRN order with F2F.  ? ?Her Address [place], [place] [address]  ? ?Referral sent to [employer] to assist with medicaid and disability. Pt receives food stamps.  ? ? ? ? ?Expected Discharge Plan: [place] ?Barriers to Discharge: Continued Medical Work up ? ? ?Patient Goals and CMS Choice ?Patient states their goals for this hospitalization and ongoing recovery are:: wants to get stronger ?CMS Medicare.gov Compare Post Acute Care list provided to:: Patient ?Choice offered to / list presented to : Patient ? ?Expected Discharge Plan and Services ?Expected Discharge Plan: [place] ?In-house Referral: Clinical Social Work ?Discharge Planning Services: CM Consult ?Post Acute Care Choice: Home Health ?Living arrangements for the past 2 months: [place] ?                ?  ?  ?  ?  ?  ?HH Arranged: Disease Management ?[place] Agency: [place] ?Date HH Agency Contacted: 01/10/22 ?[place]: [id number] spoke with at [place]: Romie Jumper ? ?Prior Living Arrangements/Services ?Living arrangements for the past 2 months: [place] ?Lives with:: Significant Other ?Patient language and need for interpreter reviewed:: Yes ?Do you feel safe going back to  the place where you live?: Yes      ?Need for Family Participation in Patient Care: No (Comment) ?Care giver support system in place?: No (comment) ?  ?Criminal Activity/Legal Involvement Pertinent to Current Situation/Hospitalization: No - Comment as needed ? ?Activities of Daily Living ?Home Assistive Devices/Equipment: Eyeglasses (reading glasses) ?ADL Screening (condition at time of admission) ?Patient's cognitive ability adequate to safely complete daily activities?: Yes ?Is the patient deaf or have difficulty hearing?: No ?Does the patient have difficulty seeing, even when wearing glasses/contacts?: Yes (blind in left eye) ?Does the patient have difficulty concentrating, remembering, or making decisions?: No ?Patient able to express need for assistance with ADLs?: Yes ?Does the patient have difficulty dressing or bathing?: No ?Independently performs ADLs?: Yes (appropriate for developmental age) ?Does the patient have difficulty walking or climbing stairs?: Yes ?Weakness of Legs: Both ?Weakness of Arms/Hands: Both ? ?Permission Sought/Granted ?Permission sought to share information with : Case Manager, Family Supports, PCP ?Permission granted to share information with : Yes, Verbal Permission Granted ? Share Information with NAME: Carmelia Bake ? Permission granted to share info w AGENCY: Home [facility]al[facility] ? Permission granted to share info w Relationship: girlfriend ? Permission granted to share info w Contact Information: [phone] ? ?Emotional Assessment ?Appearance:: Appears stated age ?  ?  ?Orientation: : Oriented to Self, Oriented to Place, Oriented to  Time, Oriented to Situation ?Alcohol / Substance Use: Not Applicable ?  Psych Involvement: No (comment) ? ?Admission diagnosis:  Shortness of breath [R06.02] ?Acute heart failure ([place]) [I50.9] ?Peripheral edema [R60.9] ?Hypoxia [R09.02] ?Elevated brain natriuretic peptide (BNP) level [R79.89] ?Patient Active Problem List  ? Diagnosis Date Noted  ? Acute heart  failure ([place]) 01/07/2022  ? Essential hypertension 01/07/2022  ? Diabetes mellitus ([place]) 10/22/2013  ? Hypothyroidism 10/08/2013  ? AKI (acute kidney injury) ([place]) 10/07/2013  ? Hyperglycemia without ketosis 10/06/2013  ? DKA (diabetic ketoacidoses) 10/06/2013  ? Elevated blood pressure 10/06/2013  ? History of gout 10/06/2013  ? History of goiter 10/06/2013  ? Type 2 diabetes mellitus with hyperosmolar nonketotic hyperglycemia ([place]) 10/06/2013  ? CKD (chronic kidney disease) stage 3, GFR 30-59 ml/min (HCC) 10/06/2013  ? Obesity 10/06/2013  ? GERD (gastroesophageal reflux disease) 10/06/2013  ? ?PCP:  Pcp, No ?Pharmacy:   ?[id number] - [place], [address] ?[place] [address] ?Phone: [phone] Fax: [phone] ? ?[place] [facility] [address], [address] ?[place] [address] ?Phone: [phone] Fax: [phone] ? ?[id number] - [place], [place] [address] ?[address] ?[place] [address] ?Phone: [phone] Fax: [phone] ? ? ? ? ?Social Determinants of Health (SDOH) Interventions ?  ? ?Readmission Risk Interventions ?   ? View : No data to display.  ?  ?  ?  ? ? ? ?

## 2022-01-10 NOTE — Progress Notes (Addendum)
Received report patient not returning to [facility] following cardiac cath. Family updated and called about belongings. ? ?Spoke with Talon Regala [phone] (patients brother) said he will try to pick up patients belongings this evening before 7pm. RN will leave belongings at the nurses station. ?

## 2022-01-10 NOTE — Consult Note (Signed)
?  ?Advanced Heart Failure Team Consult Note ? ? ?Primary Physician: Pcp, No ?PCP-Cardiologist:  None ? ?Reason for Consultation: CHF ? ?HPI:   ? ?Eric Larson is seen today for evaluation of CHF at the request of Dr. Gasper Sells.  ? ?61 y.o. 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  Patient works as a Dealer, for about 4-5 weeks had noted significant dyspnea while working.  This gradually progressed to dyspnea with mild exertion.  He also reports orthopnea/PND. No chest pain.  No lightheadedness or palpitations. He was seen in the ER at [facility] in 4/23 and told that he had CHF.  He was given appointment to see a cardiologist apparently but did not see anyone prior to returning to the ER here on 5/8 with worsening symptoms.  He was admitted with volume overload/CHF.  BNP was elevated, HS-TnI was minimally elevated with no trend. Creatinine has been elevated this admission in the 1.8-2 range.  BP has tended to run high with narrow pulse pressure. He received Lasix 120 mg IV x 1 yesterday evening, good response with net I/O -2627 yesterday.  ? ?Echo was done this admission and I reviewed, EF 15-20% range with mildly dilated LV, moderately dilated RV with severely decreased systolic function, moderate-severe functional mitral regurgitation, dilated IVC.  ? ?Review of Systems: All systems reviewed and negative except as per HPI.  ? ?Home Medications ?Prior to Admission medications   ?Medication Sig Start Date End Date Taking? Authorizing Provider  ?furosemide (LASIX) 40 MG tablet Take 40 mg by mouth daily. 12/13/21  Yes [provider]  ?metFORMIN (GLUCOPHAGE) 1000 MG tablet Take 1,000 mg by mouth 2 (two) times daily with a meal.   Yes [provider]  ?Blood Glucose Monitoring Suppl (TRUE METRIX METER) W/DEVICE KIT 1 each by Does not apply route 3 (three) times daily.  08/25/15   Tresa Garter, MD  ?gemfibrozil (LOPID) 600 MG tablet Take 1 tablet (600 mg total) by mouth 2 (two) times daily before a meal. ?Patient not taking: Reported on 01/07/2022 09/06/16 02/09/22  Alfonse Spruce, FNP  ?glucose blood test strip Use as instructed 11/18/16   Alfonse Spruce, FNP  ?levothyroxine (SYNTHROID, LEVOTHROID) 75 MCG tablet Take 1 tablet (75 mcg total) by mouth daily. ?Patient not taking: Reported on 01/07/2022 09/06/16   Alfonse Spruce, FNP  ?lisinopril (PRINIVIL,ZESTRIL) 10 MG tablet Take 1 tablet (10 mg total) by mouth daily. ?Patient not taking: Reported on 01/07/2022 11/13/16   Alfonse Spruce, FNP  ?metFORMIN (GLUCOPHAGE) 500 MG tablet Take 1 tablet (500 mg total) by mouth 2 (two) times daily with a meal. ?Patient not taking: Reported on 01/07/2022 09/04/16   Alfonse Spruce, FNP  ?oxyCODONE-acetaminophen (PERCOCET/ROXICET) 5-325 MG tablet Take 1 tablet by mouth every 4 (four) hours as needed for severe pain. ?Patient not taking: Reported on 01/07/2022 01/06/18   Okey Regal, PA-C  ?tamsulosin (FLOMAX) 0.4 MG CAPS capsule Take 1 capsule (0.4 mg total) by mouth daily. ?Patient not taking: Reported on 01/07/2022 01/06/18   Okey Regal, PA-C  ? ? ?Past Medical History: ?1. HTN ?2. Type 2 diabetes ?3. GERD ?4. Left eye blindness: retinal hemorrhage.  ?5. Hypothyroidism ?6. Gout ?7. CKD stage 3 ? ?Past Surgical History: ?Past Surgical History:  ?Procedure Laterality Date  ? THYROID SURGERY    ? ? ?Family History: ?Family History  ?Problem  Relation Age of Onset  ? Heart attack Father   ? Diabetes Mellitus II Neg Hx   ? CAD Neg Hx   ? ? ?Social History: ?Social History  ? ?Socioeconomic History  ? Marital status: Single  ?  Spouse name: Not on file  ? Number of children: Not on file  ? Years of education: Not on file  ? Highest education level: Not on file  ?Occupational History  ? Not on file  ?Tobacco Use  ? Smoking status: Never  ? Smokeless tobacco: Never  ?Substance and  Sexual Activity  ? Alcohol use: No  ? Drug use: No  ? Sexual activity: Yes  ?Other Topics Concern  ? Not on file  ?Social History Narrative  ? Not on file  ? ?Social Determinants of Health  ? ?Financial Resource Strain: Not on file  ?Food Insecurity: Not on file  ?Transportation Needs: Not on file  ?Physical Activity: Not on file  ?Stress: Not on file  ?Social Connections: Not on file  ? ? ?Allergies:  ?No Known Allergies ? ?Objective:   ? ?Vital Signs:   ?Temp:  [97.4 ?F (36.3 ?C)-98 ?F (36.7 ?C)] 97.4 ?F (36.3 ?C) (05/11 0526) ?Pulse Rate:  [52-87] 82 (05/11 0526) ?Resp:  [16-22] 20 (05/11 0526) ?BP: (123-144)/(88-94) 123/88 (05/11 0526) ?SpO2:  [98 %-100 %] 99 % (05/11 0526) ?Weight:  [95.3 kg] 95.3 kg (05/11 0500) ?Last BM Date : 01/07/22 ? ?Weight change: ?Filed Weights  ? 01/08/22 0500 01/09/22 0451 01/10/22 0500  ?Weight: 100.7 kg 100.7 kg 95.3 kg  ? ? ?Intake/Output:  ? ?Intake/Output Summary (Last 24 hours) at 01/10/2022 0854 ?Last data filed at 01/10/2022 0834 ?Gross per 24 hour  ?Intake 723 ml  ?Output 3650 ml  ?Net -2927 ml  ?  ? ? ?Physical Exam  ?  ?General:  Well appearing. No resp difficulty ?HEENT: normal ?Neck: supple. JVP appears elevated. Carotids 2+ bilat; no bruits. No lymphadenopathy or thyromegaly appreciated. ?Cor: PMI nondisplaced. Regular rate & rhythm. 2/6 HSM apex.  ?Lungs: clear ?Abdomen: soft, nontender, nondistended. No hepatosplenomegaly. No bruits or masses. Good bowel sounds. ?Extremities: no cyanosis, clubbing, rash, edema ?Neuro: alert & orientedx3, cranial nerves grossly intact. moves all 4 extremities w/o difficulty. Affect pleasant ? ? ?Telemetry  ? ?NSR (personally reviewed) ? ?EKG  ?  ?NSR, LVH with repolarization abnormality (personally reviewed).  ? ?Labs  ? ?Basic Metabolic Panel: ?Recent Labs  ?Lab 01/07/22 ?1315 01/08/22 ?0352 01/09/22 ?6294 01/09/22 ?1503 01/10/22 ?7654  ?NA 139 140 137 137 137  ?K 3.8 4.3 3.8 3.8 3.4*  ?CL 110 108 103 100 99  ?CO2 20* '23 22 27 27   ' ?GLUCOSE 171* 173* 181* 197* 152*  ?BUN 26* 30* 35* 38* 42*  ?CREATININE 1.64* 1.85* 1.83* 2.03* 1.83*  ?CALCIUM 8.6* 9.1 9.0 9.6 9.6  ? ? ?Liver Function Tests: ?Recent Labs  ?Lab 01/07/22 ?1315  ?AST 56*  ?ALT 57*  ?ALKPHOS 40  ?BILITOT 1.0  ?PROT 7.4  ?ALBUMIN 3.6  ? ?No results for input(s): LIPASE, AMYLASE in the last 168 hours. ?No results for input(s): AMMONIA in the last 168 hours. ? ?CBC: ?Recent Labs  ?Lab 01/07/22 ?1315  ?WBC 4.2  ?HGB 12.6*  ?HCT 38.1*  ?MCV 90.7  ?PLT 144*  ? ? ?Cardiac Enzymes: ?No results for input(s): CKTOTAL, CKMB, CKMBINDEX, TROPONINI in the last 168 hours. ? ?BNP: ?BNP (last 3 results) ?Recent Labs  ?  12/13/21 ?6503 01/07/22 ?1315  ?BNP 1,224.5* 1,116.3*  ? ? ?  ProBNP (last 3 results) ?No results for input(s): PROBNP in the last 8760 hours. ? ? ?CBG: ?Recent Labs  ?Lab 01/09/22 ?0757 01/09/22 ?1207 01/09/22 ?1609 01/09/22 ?2158 01/10/22 ?3225  ?GLUCAP 159* 239* 148* 201* 167*  ? ? ?Coagulation Studies: ?No results for input(s): LABPROT, INR in the last 72 hours. ? ? ?Imaging  ? ?No results found. ? ? ?Medications:   ? ? ?Current Medications: ? [START ON 01/11/2022] aspirin EC  81 mg Oral Daily  ? enoxaparin (LOVENOX) injection  40 mg Subcutaneous Q24H  ? hydrALAZINE  25 mg Oral Q8H  ? insulin aspart  0-5 Units Subcutaneous QHS  ? insulin aspart  0-9 Units Subcutaneous TID WC  ? insulin aspart  4 Units Subcutaneous TID WC  ? insulin detemir  5 Units Subcutaneous Daily  ? isosorbide mononitrate  30 mg Oral Daily  ? levothyroxine  75 mcg Oral Daily  ? sodium chloride flush  3 mL Intravenous Q12H  ? sodium chloride flush  3 mL Intravenous Q12H  ? ? ?Infusions: ? sodium chloride    ? sodium chloride    ? sodium chloride    ? ? ? ? ?Assessment/Plan  ? ?1. Acute systolic CHF: Initial diagnosis of heart failure, symptoms present for at least a month. NYHA class IV at admission.  Echo this admission with EF 15-20% range with mildly dilated LV, moderately dilated RV with severely decreased  systolic function, moderate-severe functional mitral regurgitation, dilated IVC.  Does not have history of ETOH or drugs.  No strong FH of cardiomyopathy.  Has hypothyroidism with mildly elevated TSH (on Attikus Bartoszek

## 2022-01-10 NOTE — Plan of Care (Signed)
  Problem: Education: Goal: Ability to verbalize understanding of medication therapies will improve Outcome: Progressing   Problem: Activity: Goal: Capacity to carry out activities will improve Outcome: Progressing   

## 2022-01-10 NOTE — Progress Notes (Signed)
Peripherally Inserted Central Catheter Placement ? ?The IV Nurse has discussed with the patient and/or persons authorized to consent for the patient, the purpose of this procedure and the potential benefits and risks involved with this procedure.  The benefits include less needle sticks, lab draws from the catheter, and the patient may be discharged home with the catheter. Risks include, but not limited to, infection, bleeding, blood clot (thrombus formation), and puncture of an artery; nerve damage and irregular heartbeat and possibility to perform a PICC exchange if needed/ordered by physician.  Alternatives to this procedure were also discussed.  Bard Power PICC patient education guide, fact sheet on infection prevention and patient information card has been provided to patient /or left at bedside.   ? ?PICC Placement Documentation  ?PICC Double Lumen 0000000 Left Basilic 43 cm 0 cm (Active)  ?Indication for Insertion or Continuance of Line Vasoactive infusions 01/10/22 1430  ?Exposed Catheter (cm) 0 cm 01/10/22 1430  ?Site Assessment Clean, Dry, Intact 01/10/22 1430  ?Lumen #1 Status Flushed;Blood return noted;Saline locked 01/10/22 1430  ?Lumen #2 Status Flushed;Blood return noted;Saline locked 01/10/22 1430  ?Dressing Type Transparent 01/10/22 1430  ?Dressing Status Antimicrobial disc in place 01/10/22 1430  ?Dressing Change Due 01/17/22 01/10/22 1430  ? ? ? ? ? ?Eric Larson ?01/10/2022, 2:32 PM ? ?

## 2022-01-10 NOTE — Progress Notes (Signed)
Pt brought to cath lab holding, Bay 4, connected to monitor, see flowsheets, denies pain, safety maintained, call bell at side ?

## 2022-01-11 ENCOUNTER — Inpatient Hospital Stay (HOSPITAL_COMMUNITY): Payer: Medicaid Other

## 2022-01-11 DIAGNOSIS — I5043 Acute on chronic combined systolic (congestive) and diastolic (congestive) heart failure: Secondary | ICD-10-CM

## 2022-01-11 LAB — GLUCOSE, CAPILLARY
Glucose-Capillary: 197 mg/dL — ABNORMAL HIGH (ref 70–99)
Glucose-Capillary: 196 mg/dL — ABNORMAL HIGH (ref 70–99)
Glucose-Capillary: 171 mg/dL — ABNORMAL HIGH (ref 70–99)
Glucose-Capillary: 355 mg/dL — ABNORMAL HIGH (ref 70–99)

## 2022-01-11 LAB — CBC
HCT: 39.4 % (ref 39.0–52.0)
Hemoglobin: 13.4 g/dL (ref 13.0–17.0)
MCH: 29.9 pg (ref 26.0–34.0)
MCHC: 34 g/dL (ref 30.0–36.0)
MCV: 87.9 fL (ref 80.0–100.0)
Platelets: 151 10*3/uL (ref 150–400)
RBC: 4.48 MIL/uL (ref 4.22–5.81)
RDW: 13.9 % (ref 11.5–15.5)
WBC: 5.9 10*3/uL (ref 4.0–10.5)
nRBC: 0 % (ref 0.0–0.2)

## 2022-01-11 LAB — BASIC METABOLIC PANEL
Anion gap: 10 (ref 5–15)
BUN: 31 mg/dL — ABNORMAL HIGH (ref 8–23)
CO2: 29 mmol/L (ref 22–32)
Calcium: 9.4 mg/dL (ref 8.9–10.3)
Chloride: 92 mmol/L — ABNORMAL LOW (ref 98–111)
Creatinine, Ser: 1.83 mg/dL — ABNORMAL HIGH (ref 0.61–1.24)
GFR, Estimated: 41 mL/min — ABNORMAL LOW (ref >60)
Glucose, Bld: 309 mg/dL — ABNORMAL HIGH (ref 70–99)
Potassium: 3.5 mmol/L (ref 3.5–5.1)
Sodium: 131 mmol/L — ABNORMAL LOW (ref 135–145)

## 2022-01-11 LAB — COOXEMETRY PANEL
Carboxyhemoglobin: 1.8 % — ABNORMAL HIGH (ref 0.5–1.5)
Methemoglobin: 0.7 % (ref 0.0–1.5)
O2 Saturation: 65.8 %
Total hemoglobin: 14 g/dL (ref 12.0–16.0)

## 2022-01-11 LAB — MAGNESIUM: Magnesium: 1.8 mg/dL (ref 1.7–2.4)

## 2022-01-11 MED ORDER — POTASSIUM CHLORIDE CRYS ER 20 MEQ PO TBCR
60.0000 meq | EXTENDED_RELEASE_TABLET | Freq: Once | ORAL | Status: AC
  Administered 2022-01-11: 60 meq via ORAL
  Filled 2022-01-11: qty 3

## 2022-01-11 MED ORDER — SACUBITRIL-VALSARTAN 24-26 MG PO TABS
1.0000 | ORAL_TABLET | Freq: Two times a day (BID) | ORAL | Status: DC
  Administered 2022-01-11 – 2022-01-13 (×5): 1 via ORAL
  Filled 2022-01-11 (×5): qty 1

## 2022-01-11 MED ORDER — MAGNESIUM SULFATE 2 GM/50ML IV SOLN
2.0000 g | Freq: Once | INTRAVENOUS | Status: AC
  Administered 2022-01-11: 2 g via INTRAVENOUS
  Filled 2022-01-11: qty 50

## 2022-01-11 MED FILL — Verapamil HCl IV Soln 2.5 MG/ML: INTRAVENOUS | Qty: 2 | Status: AC

## 2022-01-11 NOTE — TOC Benefit Eligibility Note (Signed)
Patient Advocate Encounter ? ?Insurance verification completed.   ? ?The patient is currently admitted and upon discharge could be taking Entresto 24-26 mg. ? ?The current 30 day co-pay is, $318.87.  ? ?The patient is currently admitted and upon discharge could be taking Farxiga 10 mg. ? ?The current 30 day co-pay is, $31.09.  ? ?The patient is currently admitted and upon discharge could be taking Jardiance 10 mg. ? ?Requires Prior Authorization ? ?The patient is insured through [employer]  ? ? ?Lyndel Safe, CPhT ?Pharmacy Patient Advocate Specialist ?[place] Patient Advocate Team ?Direct Number: [phone]  Fax: [phone] ? ? ? ? ? ?  ?

## 2022-01-11 NOTE — Significant Event (Signed)
Rapid Response Event Note  ? ?Reason for Call :  ?Bradycardia and hypotension ? ?Patient to cMRI via wheelchair. After being place on MRI monitoring and switching Milrinone gtt over to MRI compatible IV pump, pt complained of not feeling well. He describes feeling warm all overall and hungry. He denies pain, lightheadedness, dizziness, nausea. On MRI monitor his HR was reading in the 40s. BP 78/51.  ? ?Initial Focused Assessment:  ?Pt sitting in chair. Color is pink, he is mildly diaphoretic. Initially radial pulse was a 2+, rate 43 bpm. Radial pulse was then noted to have a soft beat between each strong beat. Once placed on cardiac monitoring, PVC bigeminy was confirmed. BP improved to 134/112. Within 5-8 minutes, patient's HR returned to NSR with intermittent PVCs.  ? ?Patient felt better once he was assisted back to bed.  ? ?VS: T 97.25F, BP 114/75, HR 90, RR 20, SpO2 91% on room air ?CBG: 171 ? ?Interventions:  ?No intervention ? ?Plan of Care:  ?-Pt to have cMRI today- if possible with MRI scheduling ? ?Call rapid response for additional needs ? ?Event Summary:  ?MD Notified: B. Sharol Harness, [place] ?Call Time: 1550 ?Arrival Time: 1550 ?End Time: 1630 ? ?Jennye Moccasin, RN ?

## 2022-01-11 NOTE — Progress Notes (Addendum)
? ? Advanced Heart Failure Rounding Note ? ?PCP-Cardiologist: None  ? ?Subjective:   ? ?On Milrinone 0.25. Co-ox 66%. Brisk diuresis yesterday w/ Lasix gtt 6L in UOP. Wt down 6 lb. CVP not working correctly.  ? ?Breathing improved. Slept better last night w/o orthopnea.  ? ?Scr stable 1.83>>1.83  ? ?Frequent PVCs on tele ~20/min  ?K 3.5 ?Mg pending  ? ?LHC/RHC:  ?Coronary Findings ? ?Diagnostic ?Dominance: Right ?Left Anterior Descending  ?Prox LAD to Mid LAD lesion is 70% stenosed.  ?Mid LAD lesion is 30% stenosed.  ?Dist LAD lesion is 90% stenosed.  ?  ?First Diagonal Branch  ?1st Diag lesion is 80% stenosed.  ?  ?Left Circumflex  ?Prox Cx lesion is 30% stenosed.  ?  ?Right Coronary Artery  ?Prox RCA lesion is 30% stenosed.  ?Mid RCA lesion is 30% stenosed.  ?  ?Right Posterior Descending Artery  ?RPDA lesion is 40% stenosed.  ?  ?Intervention ?  ?No interventions have been documented.  ? ?Right Heart ? ?Right Heart Pressures RHC Procedural Findings: ?Hemodynamics (mmHg) ?RA mean 11 ?RV 54/13 ?PA 40/21, mean 29 ?PCWP mean 23 ?LV 119/21 ?AO 123/84 ? ?Oxygen saturations: ?PA 58% ?AO 99% ? ?Cardiac Output (Fick) 4.12  ?Cardiac Index (Fick) 1.89 ?PVR 1.4 WU  ? ? ? ?Objective:   ?Weight Range: ?91.4 kg ?Body mass index is 27.34 kg/m?.  ? ?Vital Signs:   ?Temp:  [97.5 ?F (36.4 ?C)-98 ?F (36.7 ?C)] 97.8 ?F (36.6 ?C) (05/12 0041) ?Pulse Rate:  [30-95] 92 (05/12 0435) ?Resp:  [16-27] 18 (05/12 0435) ?BP: (114-179)/(59-124) 137/62 (05/12 0435) ?SpO2:  [93 %-100 %] 95 % (05/12 0435) ?Weight:  [91.4 kg-94.3 kg] 91.4 kg (05/12 0041) ?Last BM Date : 01/07/22 ? ?Weight change: ?Filed Weights  ? 01/10/22 0500 01/10/22 1312 01/11/22 0041  ?Weight: 95.3 kg 94.3 kg 91.4 kg  ? ? ?Intake/Output:  ? ?Intake/Output Summary (Last 24 hours) at 01/11/2022 0730 ?Last data filed at 01/11/2022 1950 ?Gross per 24 hour  ?Intake 738.77 ml  ?Output 6420 ml  ?Net -5681.23 ml  ?  ? ? ?Physical Exam  ?  ?General:  Well appearing. No resp  difficulty ?HEENT: Normal ?Neck: Supple. JVP ~6-7 cm . Carotids 2+ bilat; no bruits. No lymphadenopathy or thyromegaly appreciated. ?Cor: PMI nondisplaced. Irregular rhythm (PVCs). No rubs, gallops or murmurs. ?Lungs: Clear ?Abdomen: Soft, nontender, nondistended. No hepatosplenomegaly. No bruits or masses. Good bowel sounds. ?Extremities: No cyanosis, clubbing, rash, edema ?Neuro: Alert & orientedx3, cranial nerves grossly intact. moves all 4 extremities w/o difficulty. Affect pleasant ? ? ?Telemetry  ? ?NSR w/ frequent PVCs ~20/min, 4 beat run NSVT  ? ?EKG  ?  ?N/A  ? ?Labs  ?  ?CBC ?Recent Labs  ?  01/10/22 ?1317 01/11/22 ?0458  ?WBC 4.3 5.9  ?HGB 13.8 13.4  ?HCT 40.3 39.4  ?MCV 88.2 87.9  ?PLT 161 151  ? ?Basic Metabolic Panel ?Recent Labs  ?  01/10/22 ?9326 01/10/22 ?1110 01/10/22 ?1111 01/10/22 ?1317 01/11/22 ?0458  ?NA 137   < > 139  --  131*  ?K 3.4*   < > 3.8  --  3.5  ?CL 99  --   --   --  92*  ?CO2 27  --   --   --  29  ?GLUCOSE 152*  --   --   --  309*  ?BUN 42*  --   --   --  31*  ?CREATININE 1.83*  --   --  1.82* 1.83*  ?CALCIUM 9.6  --   --   --  9.4  ? < > = values in this interval not displayed.  ? ?Liver Function Tests ?No results for input(s): AST, ALT, ALKPHOS, BILITOT, PROT, ALBUMIN in the last 72 hours. ?No results for input(s): LIPASE, AMYLASE in the last 72 hours. ?Cardiac Enzymes ?No results for input(s): CKTOTAL, CKMB, CKMBINDEX, TROPONINI in the last 72 hours. ? ?BNP: ?BNP (last 3 results) ?Recent Labs  ?  12/13/21 ?1962 01/07/22 ?1315  ?BNP 1,224.5* 1,116.3*  ? ? ?ProBNP (last 3 results) ?No results for input(s): PROBNP in the last 8760 hours. ? ? ?D-Dimer ?No results for input(s): DDIMER in the last 72 hours. ?Hemoglobin A1C ?Recent Labs  ?  01/09/22 ?1156  ?HGBA1C 7.6*  ? ?Fasting Lipid Panel ?Recent Labs  ?  01/10/22 ?2297  ?CHOL 197  ?HDL 24*  ?LDLCALC 112*  ?TRIG 307*  ?CHOLHDL 8.2  ? ?Thyroid Function Tests ?Recent Labs  ?  01/09/22 ?1156  ?TSH 15.069*  ? ? ?Other  results: ? ? ?Imaging  ? ? ?CARDIAC CATHETERIZATION ? ?Result Date: 01/10/2022 ?  Prox RCA lesion is 30% stenosed.   Mid RCA lesion is 30% stenosed.   RPDA lesion is 40% stenosed.   Prox Cx lesion is 30% stenosed.   Dist LAD lesion is 90% stenosed.   Mid LAD lesion is 30% stenosed.   Prox LAD to Mid LAD lesion is 70% stenosed.   1st Diag lesion is 80% stenosed. 1. Elevated, but not markedly elevated, filling pressures. 2. Low cardiac output (CI 1.89). 3. 70% proximal LAD stenosis, 90% distal LAD stenosis.  80% stenosis proximally in a moderate D1. I reviewed films with Dr. [place].  The proximal LAD lesion is not critical but looks to be around 70% (moderate stenosis).  This could potentially be intervened upon, as could the 1st diagonal (would not intervention on the severe far distal LAD stenosis).  However, patient reports no chest pain and his cardiomyopathy is diffuse and not explained by the moderate LAD disease.  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  Will get cardiac MRI to look for infiltrative disease, myocarditis, etc.  Also could perhaps show coronary-pattern scar suggestive of prior MI with some recanalization of the LAD spontaneously. With ongoing volume overload and low output in setting of suspected cardiorenal syndrome, will place PICC and start low dose milrinone.  Gentle diuresis with Lasix infusion, follow creatinine closely.  ? ?[place] EKG SITE RITE ? ?Result Date: 01/10/2022 ?If [employer] not attached, placement could not be confirmed due to current cardiac rhythm.  ? ? ?Medications:   ? ? ?Scheduled Medications: ? aspirin EC  81 mg Oral Daily  ? Chlorhexidine Gluconate Cloth  6 each Topical Daily  ? digoxin  0.125 mg Oral Daily  ? enoxaparin (LOVENOX) injection  40 mg Subcutaneous Q24H  ? hydrALAZINE  25 mg Oral Q8H  ? insulin aspart  0-5 Units Subcutaneous QHS  ? insulin aspart  0-9 Units Subcutaneous TID WC  ? insulin aspart   4 Units Subcutaneous TID WC  ? insulin detemir  5 Units Subcutaneous Daily  ? isosorbide mononitrate  30 mg Oral Daily  ? levothyroxine  75 mcg Oral Daily  ? rosuvastatin  20 mg Oral Daily  ? sodium chloride flush  10-40 mL Intracatheter Q12H  ? sodium chloride flush  3 mL Intravenous Q12H  ? sodium chloride flush  3  mL Intravenous Q12H  ? sodium chloride flush  3 mL Intravenous Q12H  ? spironolactone  12.5 mg Oral Daily  ? ? ?Infusions: ? sodium chloride    ? sodium chloride    ? furosemide (LASIX) 200 mg in dextrose 5% 100 mL (2mg /mL) infusion 10 mg/hr (01/11/22 03/13/22)  ? milrinone 0.25 mcg/kg/min (01/11/22 0058)  ? ? ?PRN Medications: ?sodium chloride, sodium chloride, acetaminophen, albuterol, ondansetron (ZOFRAN) IV, oxyCODONE-acetaminophen, sodium chloride flush, sodium chloride flush, sodium chloride flush, zolpidem ? ? ? ?Patient Profile  ? ?61 y.o. 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 Echo EF 15-20%, moderately dilated RV with severely decreased systolic function, moderate-severe functional mitral regurgitation.  ? ?Assessment/Plan  ? ?1. Acute systolic CHF: Initial diagnosis of heart failure, symptoms present for at least a month. NYHA class IV at admission.  Echo this admission with EF 15-20% range with mildly dilated LV, moderately dilated RV with severely decreased systolic function, moderate-severe functional mitral regurgitation, dilated IVC.  Does not have history of ETOH or drugs.  No strong FH of cardiomyopathy.  Has hypothyroidism with mildly elevated TSH (on Levoxyl).  Creatinine noted to be elevated, possible cardiorenal contribution but also has history of diabetes. R showed elevated filling pressures and low cardiac output (CI 1.89)>>started on Milrinone. LHC showed moderate LAD disease, however CM out of proportion to degree of CAD. 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 Plan cMRI  today to look for infiltrative disease, myocarditis, etc.  Also could perhaps show coronary-pattern scar suggestive of prior MI with some recanalization of the LAD spontaneously. Also ? PVC mediated CM. Frequent PVCs ~20/min on te

## 2022-01-11 NOTE — Progress Notes (Signed)
Mobility Specialist Progress Note: ? ? 01/11/22 1210  ?Mobility  ?Activity Ambulated independently in hallway  ?Level of Assistance Modified independent, requires aide device or extra time  ?Assistive Device Other (Comment) ?(IV Pole)  ?Distance Ambulated (ft) 450 ft  ?Activity Response Tolerated well  ?$Mobility charge 1 Mobility  ? ?Pt agreeable to mobility session. No physical assistance required. SpO2 >90% on RA throughout. Left sitting EOB eating lunch.  ? ?Eric Larson ?Acute Rehab ?Secure Chat or ?Office Phone: [phone] ? ?

## 2022-01-11 NOTE — Progress Notes (Signed)
Took down to MRI 10-15 minutes while waiting patient c/o weakness, denies CP, dizziness HR down in 40s,BP low 78/51. RR called.get patient back to the room. NP notified Britney NP came to see patient. Patient back to his baseline. Will continue to mnitor. ?

## 2022-01-11 NOTE — Progress Notes (Signed)
Mobility Specialist Progress Note: ? ? 01/11/22 1640  ?Mobility  ?Activity Ambulated with assistance in room  ?Level of Assistance Contact guard assist, steadying assist  ?Assistive Device None  ?Distance Ambulated (ft) 2 ft  ?Activity Response Tolerated fair  ?$Mobility charge 1 Mobility  ? ?Pt back to room after unsuccessful cMRI d/t complications. Required CGA for safety. Back in bed with all needs met.  ? ?Nelta Numbers ?Acute Rehab ?Secure Chat or ?Office Phone: [phone] ? ?

## 2022-01-12 LAB — GLUCOSE, CAPILLARY
Glucose-Capillary: 230 mg/dL — ABNORMAL HIGH (ref 70–99)
Glucose-Capillary: 169 mg/dL — ABNORMAL HIGH (ref 70–99)
Glucose-Capillary: 132 mg/dL — ABNORMAL HIGH (ref 70–99)
Glucose-Capillary: 232 mg/dL — ABNORMAL HIGH (ref 70–99)

## 2022-01-12 LAB — BASIC METABOLIC PANEL
Anion gap: 9 (ref 5–15)
Anion gap: 7 (ref 5–15)
BUN: 30 mg/dL — ABNORMAL HIGH (ref 8–23)
BUN: 30 mg/dL — ABNORMAL HIGH (ref 8–23)
CO2: 26 mmol/L (ref 22–32)
CO2: 28 mmol/L (ref 22–32)
Calcium: 9 mg/dL (ref 8.9–10.3)
Calcium: 9.3 mg/dL (ref 8.9–10.3)
Chloride: 94 mmol/L — ABNORMAL LOW (ref 98–111)
Chloride: 99 mmol/L (ref 98–111)
Creatinine, Ser: 1.82 mg/dL — ABNORMAL HIGH (ref 0.61–1.24)
Creatinine, Ser: 1.75 mg/dL — ABNORMAL HIGH (ref 0.61–1.24)
GFR, Estimated: 42 mL/min — ABNORMAL LOW (ref >60)
GFR, Estimated: 44 mL/min — ABNORMAL LOW (ref >60)
Glucose, Bld: 271 mg/dL — ABNORMAL HIGH (ref 70–99)
Glucose, Bld: 215 mg/dL — ABNORMAL HIGH (ref 70–99)
Potassium: 3.4 mmol/L — ABNORMAL LOW (ref 3.5–5.1)
Potassium: 3.7 mmol/L (ref 3.5–5.1)
Sodium: 129 mmol/L — ABNORMAL LOW (ref 135–145)
Sodium: 134 mmol/L — ABNORMAL LOW (ref 135–145)

## 2022-01-12 LAB — CBC
HCT: 42.3 % (ref 39.0–52.0)
Hemoglobin: 14.4 g/dL (ref 13.0–17.0)
MCH: 30.1 pg (ref 26.0–34.0)
MCHC: 34 g/dL (ref 30.0–36.0)
MCV: 88.3 fL (ref 80.0–100.0)
Platelets: 165 10*3/uL (ref 150–400)
RBC: 4.79 MIL/uL (ref 4.22–5.81)
RDW: 13.6 % (ref 11.5–15.5)
WBC: 6.4 10*3/uL (ref 4.0–10.5)
nRBC: 0 % (ref 0.0–0.2)

## 2022-01-12 LAB — LIPOPROTEIN A (LPA): Lipoprotein (a): 121.6 nmol/L — ABNORMAL HIGH (ref <75.0)

## 2022-01-12 LAB — COOXEMETRY PANEL
Carboxyhemoglobin: 1.7 % — ABNORMAL HIGH (ref 0.5–1.5)
Methemoglobin: <0.7 % (ref 0.0–1.5)
O2 Saturation: 65.2 %
Total hemoglobin: 11 g/dL — ABNORMAL LOW (ref 12.0–16.0)

## 2022-01-12 LAB — MAGNESIUM: Magnesium: 2.1 mg/dL (ref 1.7–2.4)

## 2022-01-12 MED ORDER — POTASSIUM CHLORIDE CRYS ER 20 MEQ PO TBCR
20.0000 meq | EXTENDED_RELEASE_TABLET | Freq: Once | ORAL | Status: AC
  Administered 2022-01-12: 20 meq via ORAL
  Filled 2022-01-12: qty 1

## 2022-01-12 MED ORDER — DAPAGLIFLOZIN PROPANEDIOL 10 MG PO TABS
10.0000 mg | ORAL_TABLET | Freq: Every day | ORAL | Status: DC
  Administered 2022-01-12 – 2022-01-13 (×2): 10 mg via ORAL
  Filled 2022-01-12 (×2): qty 1

## 2022-01-12 MED ORDER — SPIRONOLACTONE 25 MG PO TABS
25.0000 mg | ORAL_TABLET | Freq: Every day | ORAL | Status: DC
  Administered 2022-01-13: 25 mg via ORAL
  Filled 2022-01-12: qty 1

## 2022-01-12 MED ORDER — SPIRONOLACTONE 12.5 MG HALF TABLET
12.5000 mg | ORAL_TABLET | Freq: Once | ORAL | Status: AC
Start: 2022-01-12 — End: 2022-01-12
  Administered 2022-01-12: 12.5 mg via ORAL
  Filled 2022-01-12: qty 1

## 2022-01-12 NOTE — Progress Notes (Signed)
Mobility Specialist Progress Note ? ? 01/12/22 1253  ?Mobility  ?Activity Ambulated independently in hallway  ?Level of Assistance Independent after set-up  ?Assistive Device None  ?Distance Ambulated (ft) 470 ft  ?Activity Response Tolerated well  ?$Mobility charge 1 Mobility  ? ?During Mobility: 101 HR ?Post Mobility: 92 HR, 97% SpO2 ? ?Received pt in doorway eager to walk and having no complaints. Asymptomatic throughout ambulation, returned back to bed w/o fault and all needs met. ? ?Holland Falling ?Mobility Specialist ?Phone Number [phone] ? ?

## 2022-01-12 NOTE — Progress Notes (Signed)
Patient ID: Eric Larson, male   DOB: 02-Jun-1961, 61 y.o.   MRN: [id number] ?  ? ? Advanced Heart Failure Rounding Note ? ?PCP-Cardiologist: None  ? ?Subjective:   ? ?On Milrinone 0.125. Co-ox 65%. Off diuretics, CVP 4.    ? ?No dyspnea, feels fine.  ? ?Scr stable 1.83>>1.83>>1.75 ? ?Still with frequent PVCs/short NSVT runs.  ? ?MRI not done yet, had ?vagal episode going down for MRI yesterday.  ? ?LHC/RHC:  ?Coronary Findings ? ?Diagnostic ?Dominance: Right ?Left Anterior Descending  ?Prox LAD to Mid LAD lesion is 70% stenosed.  ?Mid LAD lesion is 30% stenosed.  ?Dist LAD lesion is 90% stenosed.  ?  ?First Diagonal Branch  ?1st Diag lesion is 80% stenosed.  ?  ?Left Circumflex  ?Prox Cx lesion is 30% stenosed.  ?  ?Right Coronary Artery  ?Prox RCA lesion is 30% stenosed.  ?Mid RCA lesion is 30% stenosed.  ?  ?Right Posterior Descending Artery  ?RPDA lesion is 40% stenosed.  ?  ?Intervention ?  ?No interventions have been documented.  ? ?Right Heart ? ?Right Heart Pressures RHC Procedural Findings: ?Hemodynamics (mmHg) ?RA mean 11 ?RV 54/13 ?PA 40/21, mean 29 ?PCWP mean 23 ?LV 119/21 ?AO 123/84 ? ?Oxygen saturations: ?PA 58% ?AO 99% ? ?Cardiac Output (Fick) 4.12  ?Cardiac Index (Fick) 1.89 ?PVR 1.4 WU  ? ? ? ?Objective:   ?Weight Range: ?91.9 kg ?Body mass index is 27.48 kg/m?.  ? ?Vital Signs:   ?Temp:  [97.3 ?F (36.3 ?C)-98.1 ?F (36.7 ?C)] 98 ?F (36.7 ?C) (05/13 0815) ?Pulse Rate:  [25-101] 95 (05/13 0818) ?Resp:  [17-22] 18 (05/13 0818) ?BP: (78-140)/(51-112) 115/73 (05/13 [id number]) ?SpO2:  [91 %-96 %] 95 % (05/13 0430) ?Weight:  [91.9 kg] 91.9 kg (05/13 0430) ?Last BM Date : 01/10/22 ? ?Weight change: ?Filed Weights  ? 01/10/22 1312 01/11/22 0041 01/12/22 0430  ?Weight: 94.3 kg 91.4 kg 91.9 kg  ? ? ?Intake/Output:  ? ?Intake/Output Summary (Last 24 hours) at 01/12/2022 0929 ?Last data filed at 01/12/2022 0800 ?Gross per 24 hour  ?Intake 1141.52 ml  ?Output 1580 ml  ?Net -438.48 ml  ?  ? ? ?Physical Exam  ?  ?General:  NAD ?Neck: No JVD, no thyromegaly or thyroid nodule.  ?Lungs: Clear to auscultation bilaterally with normal respiratory effort. ?CV: Nondisplaced PMI.  Heart regular S1/S2, no S3/S4, no murmur.  No peripheral edema.   ?Abdomen: Soft, nontender, no hepatosplenomegaly, no distention.  ?Skin: Intact without lesions or rashes.  ?Neurologic: Alert and oriented x 3.  ?Psych: Normal affect. ?Extremities: No clubbing or cyanosis.  ?HEENT: Normal.  ? ?Telemetry  ? ?NSR w/ frequent PVCs, short NSVT runs (personally reviewed) ? ?EKG  ?  ?N/A  ? ?Labs  ?  ?CBC ?Recent Labs  ?  01/11/22 ?0458 01/12/22 ?[id number]  ?WBC 5.9 6.4  ?HGB 13.4 14.4  ?HCT 39.4 42.3  ?MCV 87.9 88.3  ?PLT 151 165  ? ?Basic Metabolic Panel ?Recent Labs  ?  01/11/22 ?0458 01/12/22 ?0505 01/12/22 ?0615  ?NA 131* 129* 134*  ?K 3.5 3.4* 3.7  ?CL 92* 94* 99  ?CO2 29 26 28   ?GLUCOSE 309* 271* 215*  ?BUN 31* 30* 30*  ?CREATININE 1.83* 1.82* 1.75*  ?CALCIUM 9.4 9.0 9.3  ?MG 1.8  --  2.1  ? ?Liver Function Tests ?No results for input(s): AST, ALT, ALKPHOS, BILITOT, PROT, ALBUMIN in the last 72 hours. ?No results for input(s): LIPASE, AMYLASE in the last 72 hours. ?Cardiac Enzymes ?No  results for input(s): CKTOTAL, CKMB, CKMBINDEX, TROPONINI in the last 72 hours. ? ?BNP: ?BNP (last 3 results) ?Recent Labs  ?  12/13/21 ?[id number] 01/07/22 ?1315  ?BNP 1,224.5* 1,116.3*  ? ? ?ProBNP (last 3 results) ?No results for input(s): PROBNP in the last 8760 hours. ? ? ?D-Dimer ?No results for input(s): DDIMER in the last 72 hours. ?Hemoglobin A1C ?Recent Labs  ?  01/09/22 ?1156  ?HGBA1C 7.6*  ? ?Fasting Lipid Panel ?Recent Labs  ?  01/10/22 ?[id number]  ?CHOL 197  ?HDL 24*  ?LDLCALC 112*  ?TRIG 307*  ?CHOLHDL 8.2  ? ?Thyroid Function Tests ?Recent Labs  ?  01/09/22 ?1156  ?TSH 15.069*  ? ? ?Other results: ? ? ?Imaging  ? ? ?No results found. ? ? ?Medications:   ? ? ?Scheduled Medications: ? aspirin EC  81 mg Oral Daily  ? Chlorhexidine Gluconate Cloth  6 each Topical Daily  ? dapagliflozin  propanediol  10 mg Oral Daily  ? digoxin  0.125 mg Oral Daily  ? enoxaparin (LOVENOX) injection  40 mg Subcutaneous Q24H  ? hydrALAZINE  25 mg Oral Q8H  ? insulin aspart  0-5 Units Subcutaneous QHS  ? insulin aspart  0-9 Units Subcutaneous TID WC  ? insulin aspart  4 Units Subcutaneous TID WC  ? insulin detemir  5 Units Subcutaneous Daily  ? isosorbide mononitrate  30 mg Oral Daily  ? levothyroxine  75 mcg Oral Daily  ? potassium chloride  20 mEq Oral Once  ? rosuvastatin  20 mg Oral Daily  ? sacubitril-valsartan  1 tablet Oral BID  ? sodium chloride flush  10-40 mL Intracatheter Q12H  ? sodium chloride flush  3 mL Intravenous Q12H  ? sodium chloride flush  3 mL Intravenous Q12H  ? sodium chloride flush  3 mL Intravenous Q12H  ? spironolactone  12.5 mg Oral Once  ? [START ON 01/13/2022] spironolactone  25 mg Oral Daily  ? ? ?Infusions: ? sodium chloride    ? sodium chloride    ? ? ?PRN Medications: ?sodium chloride, sodium chloride, acetaminophen, albuterol, ondansetron (ZOFRAN) IV, oxyCODONE-acetaminophen, sodium chloride flush, sodium chloride flush, sodium chloride flush, zolpidem ? ? ? ?Patient Profile  ? ?60 y.o. 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 Echo EF 15-20%, moderately dilated RV with severely decreased systolic function, moderate-severe functional mitral regurgitation.  ? ?Assessment/Plan  ? ?1. Acute systolic CHF: Initial diagnosis of heart failure, symptoms present for at least a month. NYHA class IV at admission.  Echo this admission with EF 15-20% range with mildly dilated LV, moderately dilated RV with severely decreased systolic function, moderate-severe functional mitral regurgitation, dilated IVC.  Does not have history of ETOH or drugs.  No strong FH of cardiomyopathy.  Has hypothyroidism with mildly elevated TSH (on Levoxyl).  Creatinine noted to be elevated, possible cardiorenal  contribution but also has history of diabetes. R showed elevated filling pressures and low cardiac output (CI 1.89)>>started on Milrinone. LHC showed moderate LAD disease, however CMP out of proportion to degree of CAD. 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 Plan cMRI to look for infiltrative disease, myocarditis, etc.  Also could perhaps show coronary-pattern scar suggestive of prior MI with some recanalization of the LAD spontaneously. Also ? PVCs contributing to CMP.  CVP 4, today, co-ox 65% on milrinone 0.125. BP stable, no lightheadedness.  ?- Stop milrinone today.  ?- No Lasix yet.  ?- Start dapagliflozin  10 daily.   ?- Continue current hydralazine/Imdur. ?- Increase spironolactone to 25 daily.  ?- Continue digoxin 0.125 mg daily  ?- Continue Entresto 24/26 bid.   ?- Think he had a vagal episode yesterday when he went down for cMRI, would try to get this done today if staff is around to do study.  ?- Narrow QRS, would not be CRT candidate.  ?2. CAD: Mod LAD and Diag disease. The proximal LAD lesion is not critical but looks to be around 70% (moderate stenosis).  This could potentially be intervened upon, as could the 1st diagonal (would not intervene on the severe far distal LAD stenosis).  However, patient reports no chest pain and his cardiomyopathy is diffuse and not explained by the moderate LAD disease. Would manage medically for now unless he develops chest pain.  ?- continue ASA + statin  ?3. CKD stage 3: suspect baseline diabetic nephropathy as pre-admission creatinine was 1.5, but also may be component of cardiorenal syndrome. SCr 1.75 today.  ?- follow BMP  ?- Add SGLT2 inhibitor.  ?4. Diabetes: Type 2, per [facility].  ?5. HTN: Will control over time with GDMT.  ?6. Mitral regurgitation: I reviewed echo, MR is in the moderate-severe range and likely functional.  This may improve over time with GDMT but if not, may be mTEER candidate in future.  ?7. PVCs: frequent ~20/min on tele. ? If contributory  to CMP.  Will need to see if they decrease off milrinone.  ?- Stop milrinone today.  ?- Hopefully low dose beta blocker if co-ox remains stable.  ?- Replace K.   ?- may need AAD w/ mexiletine  ?- consider outpati

## 2022-01-12 NOTE — Progress Notes (Signed)
Pt had 5 beat run of V-tach. Not symptomatic ?Notified Dr. Shirlee Latch in person. ?

## 2022-01-13 ENCOUNTER — Other Ambulatory Visit: Payer: Self-pay | Admitting: Physician Assistant

## 2022-01-13 DIAGNOSIS — N183 Chronic kidney disease, stage 3 unspecified: Secondary | ICD-10-CM

## 2022-01-13 LAB — BASIC METABOLIC PANEL
Anion gap: 10 (ref 5–15)
BUN: 23 mg/dL (ref 8–23)
CO2: 26 mmol/L (ref 22–32)
Calcium: 9.3 mg/dL (ref 8.9–10.3)
Chloride: 99 mmol/L (ref 98–111)
Creatinine, Ser: 1.82 mg/dL — ABNORMAL HIGH (ref 0.61–1.24)
GFR, Estimated: 42 mL/min — ABNORMAL LOW (ref >60)
Glucose, Bld: 164 mg/dL — ABNORMAL HIGH (ref 70–99)
Potassium: 4.4 mmol/L (ref 3.5–5.1)
Sodium: 135 mmol/L (ref 135–145)

## 2022-01-13 LAB — CBC
HCT: 36.8 % — ABNORMAL LOW (ref 39.0–52.0)
Hemoglobin: 12.5 g/dL — ABNORMAL LOW (ref 13.0–17.0)
MCH: 30.3 pg (ref 26.0–34.0)
MCHC: 34 g/dL (ref 30.0–36.0)
MCV: 89.3 fL (ref 80.0–100.0)
Platelets: 138 10*3/uL — ABNORMAL LOW (ref 150–400)
RBC: 4.12 MIL/uL — ABNORMAL LOW (ref 4.22–5.81)
RDW: 13.9 % (ref 11.5–15.5)
WBC: 5.6 10*3/uL (ref 4.0–10.5)
nRBC: 0 % (ref 0.0–0.2)

## 2022-01-13 LAB — COOXEMETRY PANEL
Carboxyhemoglobin: 1.6 % — ABNORMAL HIGH (ref 0.5–1.5)
Methemoglobin: <0.7 % (ref 0.0–1.5)
O2 Saturation: 58.1 %
Total hemoglobin: 15.3 g/dL (ref 12.0–16.0)

## 2022-01-13 LAB — GLUCOSE, CAPILLARY
Glucose-Capillary: 172 mg/dL — ABNORMAL HIGH (ref 70–99)
Glucose-Capillary: 224 mg/dL — ABNORMAL HIGH (ref 70–99)

## 2022-01-13 MED ORDER — ISOSORBIDE MONONITRATE ER 30 MG PO TB24: 30.0000 mg | ORAL_TABLET | Freq: Every day | ORAL | 1 refills | Status: DC

## 2022-01-13 MED ORDER — SPIRONOLACTONE 25 MG PO TABS: 25.0000 mg | ORAL_TABLET | Freq: Every day | ORAL | 1 refills | Status: DC

## 2022-01-13 MED ORDER — ASPIRIN 81 MG PO TBEC: 81.0000 mg | DELAYED_RELEASE_TABLET | Freq: Every day | ORAL | Status: DC

## 2022-01-13 MED ORDER — SACUBITRIL-VALSARTAN 24-26 MG PO TABS: 1.0000 | ORAL_TABLET | Freq: Two times a day (BID) | ORAL | 11 refills | Status: DC

## 2022-01-13 MED ORDER — DIGOXIN 125 MCG PO TABS: 0.1250 mg | ORAL_TABLET | Freq: Every day | ORAL | 1 refills | Status: DC

## 2022-01-13 MED ORDER — MEXILETINE HCL 150 MG PO CAPS
150.0000 mg | ORAL_CAPSULE | Freq: Two times a day (BID) | ORAL | Status: DC
  Administered 2022-01-13: 150 mg via ORAL
  Filled 2022-01-13 (×2): qty 1

## 2022-01-13 MED ORDER — DAPAGLIFLOZIN PROPANEDIOL 10 MG PO TABS: 10.0000 mg | ORAL_TABLET | Freq: Every day | ORAL | 11 refills | Status: DC

## 2022-01-13 MED ORDER — MEXILETINE HCL 150 MG PO CAPS: 150.0000 mg | ORAL_CAPSULE | Freq: Two times a day (BID) | ORAL | 5 refills | Status: DC

## 2022-01-13 MED ORDER — HYDRALAZINE HCL 25 MG PO TABS: 25.0000 mg | ORAL_TABLET | Freq: Three times a day (TID) | ORAL | 5 refills | Status: DC

## 2022-01-13 MED ORDER — ROSUVASTATIN CALCIUM 20 MG PO TABS: 20.0000 mg | ORAL_TABLET | Freq: Every day | ORAL | 3 refills | Status: DC

## 2022-01-13 NOTE — Care Management (Addendum)
Information entered in [facility] for [facility]. Letter created in communications tab and printed to CM office, will need delivered to patient prior to DC.  ? ?12:20 Entresto and MATCH coupons given to patient. Per pharmacist patient will follow up w HF clinic for HF funds assistance for refills after DC ?

## 2022-01-13 NOTE — Progress Notes (Signed)
Patient ID: Eric Larson, male   DOB: November 16, 1960, 61 y.o.   MRN: [id number] ?  ? ? Advanced Heart Failure Rounding Note ? ?PCP-Cardiologist: None  ? ?Subjective:   ? ?Off milrinone, co-ox 58%. Off diuretics, CVP 4.    ? ?No dyspnea, feels fine. Has walked down the hall.  ? ?Scr stable 1.83>>1.83>>1.75>>1.82 ? ?Still with frequent PVCs/short NSVT runs.  ? ?MRI not done yet.  ? ?LHC/RHC:  ?Coronary Findings ? ?Diagnostic ?Dominance: Right ?Left Anterior Descending  ?Prox LAD to Mid LAD lesion is 70% stenosed.  ?Mid LAD lesion is 30% stenosed.  ?Dist LAD lesion is 90% stenosed.  ?  ?First Diagonal Branch  ?1st Diag lesion is 80% stenosed.  ?  ?Left Circumflex  ?Prox Cx lesion is 30% stenosed.  ?  ?Right Coronary Artery  ?Prox RCA lesion is 30% stenosed.  ?Mid RCA lesion is 30% stenosed.  ?  ?Right Posterior Descending Artery  ?RPDA lesion is 40% stenosed.  ?  ?Intervention ?  ?No interventions have been documented.  ? ?Right Heart ? ?Right Heart Pressures RHC Procedural Findings: ?Hemodynamics (mmHg) ?RA mean 11 ?RV 54/13 ?PA 40/21, mean 29 ?PCWP mean 23 ?LV 119/21 ?AO 123/84 ? ?Oxygen saturations: ?PA 58% ?AO 99% ? ?Cardiac Output (Fick) 4.12  ?Cardiac Index (Fick) 1.89 ?PVR 1.4 WU  ? ? ? ?Objective:   ?Weight Range: ?91.4 kg ?Body mass index is 27.33 kg/m?.  ? ?Vital Signs:   ?Temp:  [97.5 ?F (36.4 ?C)-97.6 ?F (36.4 ?C)] 97.5 ?F (36.4 ?C) (05/14 0350) ?Pulse Rate:  [43-99] 99 (05/14 0842) ?Resp:  [12-15] 15 (05/14 [id number]) ?BP: (106-129)/(69-101) 112/69 (05/14 [id number]) ?SpO2:  [93 %-99 %] 99 % (05/14 0842) ?Weight:  [91.4 kg] 91.4 kg (05/14 0350) ?Last BM Date : 01/12/22 ? ?Weight change: ?Filed Weights  ? 01/11/22 0041 01/12/22 0430 01/13/22 0350  ?Weight: 91.4 kg 91.9 kg 91.4 kg  ? ? ?Intake/Output:  ? ?Intake/Output Summary (Last 24 hours) at 01/13/2022 1011 ?Last data filed at 01/13/2022 0804 ?Gross per 24 hour  ?Intake 875 ml  ?Output 2325 ml  ?Net -1450 ml  ?  ? ? ?Physical Exam  ?  ?General: NAD ?Neck: No JVD, no  thyromegaly or thyroid nodule.  ?Lungs: Clear to auscultation bilaterally with normal respiratory effort. ?CV: Nondisplaced PMI.  Heart regular S1/S2, no S3/S4. 2/6 HSM apex.  No peripheral edema.   ?Abdomen: Soft, nontender, no hepatosplenomegaly, no distention.  ?Skin: Intact without lesions or rashes.  ?Neurologic: Alert and oriented x 3.  ?Psych: Normal affect. ?Extremities: No clubbing or cyanosis.  ?HEENT: Normal.  ? ? ?Telemetry  ? ?NSR w/ frequent PVCs, 1 short NSVT run (personally reviewed) ? ?EKG  ?  ?N/A  ? ?Labs  ?  ?CBC ?Recent Labs  ?  01/11/22 ?0458 01/12/22 ?[id number]  ?WBC 5.9 6.4  ?HGB 13.4 14.4  ?HCT 39.4 42.3  ?MCV 87.9 88.3  ?PLT 151 165  ? ?Basic Metabolic Panel ?Recent Labs  ?  01/11/22 ?0458 01/12/22 ?0505 01/12/22 ?0615 01/13/22 ?0600  ?NA 131*   < > 134* 135  ?K 3.5   < > 3.7 4.4  ?CL 92*   < > 99 99  ?CO2 29   < > 28 26  ?GLUCOSE 309*   < > 215* 164*  ?BUN 31*   < > 30* 23  ?CREATININE 1.83*   < > 1.75* 1.82*  ?CALCIUM 9.4   < > 9.3 9.3  ?MG 1.8  --  2.1  --   ? < > =  values in this interval not displayed.  ? ?Liver Function Tests ?No results for input(s): AST, ALT, ALKPHOS, BILITOT, PROT, ALBUMIN in the last 72 hours. ?No results for input(s): LIPASE, AMYLASE in the last 72 hours. ?Cardiac Enzymes ?No results for input(s): CKTOTAL, CKMB, CKMBINDEX, TROPONINI in the last 72 hours. ? ?BNP: ?BNP (last 3 results) ?Recent Labs  ?  12/13/21 ?[id number] 01/07/22 ?1315  ?BNP 1,224.5* 1,116.3*  ? ? ?ProBNP (last 3 results) ?No results for input(s): PROBNP in the last 8760 hours. ? ? ?D-Dimer ?No results for input(s): DDIMER in the last 72 hours. ?Hemoglobin A1C ?No results for input(s): HGBA1C in the last 72 hours. ? ?Fasting Lipid Panel ?No results for input(s): CHOL, HDL, LDLCALC, TRIG, CHOLHDL, LDLDIRECT in the last 72 hours. ? ?Thyroid Function Tests ?No results for input(s): TSH, T4TOTAL, T3FREE, THYROIDAB in the last 72 hours. ? ?Invalid input(s): FREET3 ? ? ?Other results: ? ? ?Imaging  ? ? ?No  results found. ? ? ?Medications:   ? ? ?Scheduled Medications: ? aspirin EC  81 mg Oral Daily  ? Chlorhexidine Gluconate Cloth  6 each Topical Daily  ? dapagliflozin propanediol  10 mg Oral Daily  ? digoxin  0.125 mg Oral Daily  ? enoxaparin (LOVENOX) injection  40 mg Subcutaneous Q24H  ? hydrALAZINE  25 mg Oral Q8H  ? insulin aspart  0-5 Units Subcutaneous QHS  ? insulin aspart  0-9 Units Subcutaneous TID WC  ? insulin aspart  4 Units Subcutaneous TID WC  ? insulin detemir  5 Units Subcutaneous Daily  ? isosorbide mononitrate  30 mg Oral Daily  ? levothyroxine  75 mcg Oral Daily  ? mexiletine  150 mg Oral Q12H  ? rosuvastatin  20 mg Oral Daily  ? sacubitril-valsartan  1 tablet Oral BID  ? sodium chloride flush  10-40 mL Intracatheter Q12H  ? sodium chloride flush  3 mL Intravenous Q12H  ? sodium chloride flush  3 mL Intravenous Q12H  ? sodium chloride flush  3 mL Intravenous Q12H  ? spironolactone  25 mg Oral Daily  ? ? ?Infusions: ? sodium chloride    ? sodium chloride    ? ? ?PRN Medications: ?sodium chloride, sodium chloride, acetaminophen, albuterol, ondansetron (ZOFRAN) IV, oxyCODONE-acetaminophen, sodium chloride flush, sodium chloride flush, sodium chloride flush, zolpidem ? ? ? ?Patient Profile  ? ?61 y.o. 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 Echo EF 15-20%, moderately dilated RV with severely decreased systolic function, moderate-severe functional mitral regurgitation.  ? ?Assessment/Plan  ? ?1. Acute systolic CHF: Initial diagnosis of heart failure, symptoms present for at least a month. NYHA class IV at admission.  Echo this admission with EF 15-20% range with mildly dilated LV, moderately dilated RV with severely decreased systolic function, moderate-severe functional mitral regurgitation, dilated IVC.  Does not have history of ETOH or drugs.  No strong FH of cardiomyopathy.  Has hypothyroidism  with mildly elevated TSH (on Levoxyl).  Creatinine noted to be elevated, possible cardiorenal contribution but also has history of diabetes. R showed elevated filling pressures and low cardiac output (CI 1.89)>>started on Milrinone. LHC showed moderate LAD disease, however CMP out of proportion to degree of CAD. 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 Plan cMRI to look for infiltrative disease, myocarditis, etc.  Also could perhaps show coronary-pattern scar suggestive of prior MI with some recanalization of the LAD spontaneously. Also ? PVCs contributing to CMP.  CVP  4, today, co-ox 58% now off milrinone. BP stable, no lightheadedness.  ?- He does not appear to need Lasix.  ?- Continue dapagliflozin 10 daily.   ?- Continue current hydralazine/Imdur. ?- Continue spironolactone 25 daily.  ?- Continue digoxin 0.125 mg daily  ?- Continue Entresto 24/26 bid.   ?- Can do cMRI as outpatient.  ?- Narrow QRS, would not be CRT candidate.  ?2. CAD: Mod LAD and Diag disease. The proximal LAD lesion is not critical but looks to be around 70% (moderate stenosis).  This could potentially be intervened upon, as could the 1st diagonal (would not intervene on the severe far distal LAD stenosis).  However, patient reports no chest pain and his cardiomyopathy is diffuse and not explained by the moderate LAD disease. Would manage medically for now unless he develops chest pain.  ?- continue ASA + statin  ?- cMRI as outpatient to look for coronary pattern scar.  ?3. CKD stage 3: suspect baseline diabetic nephropathy as pre-admission creatinine was 1.5, but also may be component of cardiorenal syndrome. SCr stable at 1.82.  ?- Continue Farxiga.  ?4. Diabetes: Type 2, per [facility].  ?5. HTN: Will control over time with GDMT.  ?6. Mitral regurgitation: I reviewed echo, MR is in the moderate-severe range and likely functional.  This may improve over time with GDMT but if not, may be mTEER candidate in future.  ?7. PVCs: frequent ~20/min on  tele. ? If contributory to CMP.  Still present despite weaning off milrinone, had short NSVT run.  ?- Start mexiletine 150 mg bid.  ?- consider outpatient sleep study to assess for OSA ? ?Home today.  Needs clos

## 2022-01-13 NOTE — Discharge Summary (Signed)
?Discharge Summary  ?  ?Patient ID: Eric Larson ?MRN: [id number]; DOB: March 17, 1961 ? ?Admit date: 01/07/2022 ?Discharge date: 01/13/2022 ? ?PCP:  [name], NP ?  ?[facility] Providers ?Cardiologist:  None  ?Advanced Heart Failure:  Loralie Champagne, MD  }   ? ? ?Discharge Diagnoses  ?  ?Principal Problem: ?  Acute heart failure ([place]) ?Active Problems: ?  Type 2 diabetes mellitus with hyperosmolar nonketotic hyperglycemia ([place]) ?  CKD (chronic kidney disease) stage 3, GFR 30-59 ml/min (HCC) ?  Essential hypertension ? ? ? ?Diagnostic Studies/Procedures  ?  ?Echo 01/08/2022 ?1. Left ventricular ejection fraction, by estimation, is 15-20%. The left  ?ventricle has severely decreased function. The left ventricle demonstrates  ?global hypokinesis. The left ventricular internal cavity size was  ?moderately dilated. Indeterminate  ?diastolic filling due to E-A fusion.  ? 2. Moderate to severe MR is present related to dilated CM. 2D PISA 0.6  ?cm, ERO 0.19 cm2, R vol 28 cc, RF 61%. Values are smaller than expected  ?due to low SV of the LV (18 cc). The mitral valve is grossly normal.  ?Moderate to severe mitral valve  ?regurgitation. No evidence of mitral stenosis.  ? 3. Right ventricular systolic function is severely reduced. The right  ?ventricular size is severely enlarged. There is moderately elevated  ?pulmonary artery systolic pressure. The estimated right ventricular  ?systolic pressure is [id number] mmHg.  ? 4. Left atrial size was mild to moderately dilated.  ? 5. Right atrial size was moderately dilated.  ? 6. The aortic valve is tricuspid. There is mild calcification of the  ?aortic valve. Aortic valve regurgitation is not visualized. Aortic valve  ?sclerosis is present, with no evidence of aortic valve stenosis.  ? 7. The inferior vena cava is dilated in size with <50% respiratory  ?variability, suggesting right atrial pressure of 15 mmHg.  ? ? ?Cath 01/10/2022 ?  Prox RCA lesion is 30% stenosed. ?  Mid RCA  lesion is 30% stenosed. ?  RPDA lesion is 40% stenosed. ?  Prox Cx lesion is 30% stenosed. ?  Dist LAD lesion is 90% stenosed. ?  Mid LAD lesion is 30% stenosed. ?  Prox LAD to Mid LAD lesion is 70% stenosed. ?  1st Diag lesion is 80% stenosed. ?  ?1. Elevated, but not markedly elevated, filling pressures.  ?2. Low cardiac output (CI 1.89).  ?3. 70% proximal LAD stenosis, 90% distal LAD stenosis.  80% stenosis proximally in a moderate D1.  ?  ?I reviewed films with Dr. Martinique.  The proximal LAD lesion is not critical but looks to be around 70% (moderate stenosis).  This could potentially be intervened upon, as could the 1st diagonal (would not intervention on the severe far distal LAD stenosis).  However, patient reports no chest pain and his cardiomyopathy is diffuse and not explained by the moderate LAD disease.  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  Will get cardiac MRI to look for infiltrative disease, myocarditis, etc.  Also could perhaps show coronary-pattern scar suggestive of prior MI with some recanalization of the LAD spontaneously.  ?  ?With ongoing volume overload and low output in setting of suspected cardiorenal syndrome, will place PICC and start low dose milrinone.  Gentle diuresis with Lasix infusion, follow creatinine closely.  ?_____________ ?  ?History of Present Illness   ?  ?Eric Larson is a 61 y.o. 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 ? ?Eric Larson notes that he has had no changes in his health until the past two week.  Notes that he had new left eye blindness in [id number] but otherwise was doing well (he is unsure if he had a stroke, but notes his A1c was 7.5 in the past; documentation from ophthalmology [facility] shows evidence of retinal hemorrhage and A1c 10.8). 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 ?  ?On 12/13/21 saw [facility] for HTN and SODE.  He was give 40 IV lasix with apparent resolution of sx.  He had outpatient follow up scheduled but not performed.  Weight 210 lbs at that ED eval. ?  ?Patient notes that he is feeling terrible for the past 2 weeks.  Has had no chest pain, chest pressure, chest tightness, chest stinging .  Discomfort occurs with trying to work as a Dealer, in the form of shortness of breath, to the point that he had to stop working.  Notes shortness of breath at rest.  Noted PND and orthopnea.  Notes weight gain (he is 223 lbs today), leg swelling and abdominal swelling.  No syncope or near syncope . Notes  no palpitations or funny heart beats.    ?  ?ED eval 01/08/22:  ?EKG SR with LVH and secondary repolarization; listed as AF but there are P waves on 01/07/22 study ?CXR notable for cardiomegaly ?  ?BNP 1116 (improved form 12/13/21 1224) ?TSH 15 Free T4 0.84 ?  ?Echo: Severe biventricular failure, ventricular functional, Severe MR ?  ?Tele: SR with frequent PVCs ?  ?Started on Insulin, ASA 81 mg PO daily, synthroid, metolazone 2.5 X1, lasix 80 I, coreg. ?  ?He is out 4.5 L since 01/07/22 and feels much better. ?  ?He does not drink or smoke. ?His care is complicated by lack of insurance ? ?Hospital Course  ?   ?Consultants: Cardiology /CHF  ? ?Patient was initially admitted by internal medicine service for heart failure symptoms.  Echocardiogram obtained on 01/08/2022 demonstrated EF 15 to 20%, moderate to severe MR related to dilated cardiomyopathy, severely decreased RV EF, RVSP 52 mmHg, moderate biatrial enlargement.  Significant mitral regurgitation was felt to be functional.  Carvedilol was initially held due to decompensated heart failure.  He underwent aggressive IV diuresis.  Cardiology service was consulted on 5/10, and the heart failure service was consulted on the following  day.  Patient had good urine output on 120 mg IV Lasix.  He was started on hydralazine/Imdur.  Baseline creatinine 1.5.  Left and Right heart cath performed on 01/10/2022 demonstrated 70% proximal to mid LAD, 30% mid LAD, 90% distal LAD lesion, 80% D1, 30% proximal left circumflex artery, 30% proximal and mid RCA, 40% RPDA lesion.  He was managed medically as patient did not have any chest pain and the degree of cardiomyopathy cannot be explained by the underlying CAD.  Right heart cath demonstrated mean wedge pressure 23 mmHg, RV pressure 54/13, cardiac output 4.12, cardiac index 1.89.  He was started on milrinone to help assist with diuresis.  Telemetry revealed frequent PVCs, unclear if contributory to cardiomyopathy.  He was started on mexiletine for suppression of PVCs.  Spironolactone and digoxin were added to his medical regimen given the LV dysfunction.  Initially cardiac MRI was recommended to look  for infiltrative disease, myocarditis or coronary pattern scar suggestive of previous MI, however patient had a possible vagal episode going down to the MRI 5/12 with significant bradycardia down to the 40s, high and hypotension down to the [id number] systolic, therefore the study was canceled.  He was able to be weaned off of milrinone on 5/13, Entresto and Farxiga were added.  ? ?He was seen by Dr. Aundra Dubin in the morning of 01/13/2022, at which point he was doing well.  It was felt the patient does not need Lasix at this point.  He will be discharged on combination of hydralazine/Imdur, Farxiga, spironolactone, digoxin and Entresto. Mexiletine added for frequent PVCs and short episodes of NSVT. We plan to consider cardiac MRI as outpatient.  He has Medicaid pending.  Dr. Aundra Dubin also recommended possible outpatient sleep study to assess for obstructive sleep apnea. ? ?Patient will get his medications via from [facility] on [address] who is a participating pharmacy of the [facility]. After today, he will need refill  of his meds through the [facility].  ? ? ? ? ?Did the patient have an acute coronary syndrome (MI, NSTEMI, STEMI, etc) this admission?:  No                               ?Did the patient have a percutaneous co

## 2022-01-13 NOTE — Progress Notes (Signed)
RN went over discharge instructions with patient at the bedside. RN went over medication changes and pharmacy location. Patient's MATCH letter and medication assistance coupons at bedside. RN placed in discharge packet. Tele monitor removed and CCMD has been notified. PIV removed and patient tolerated well. L PICC removed via IV team. Patient is getting dressed, transport has been called.  ?

## 2022-01-13 NOTE — Discharge Instructions (Addendum)
No lifting over 5 lbs for 1 week. No sexual activity for 1 week. Keep procedure site clean & dry. If you notice increased pain, swelling, bleeding or pus, call/return!  You may shower, but no soaking baths/hot tubs/pools for 1 week.  ? ?Heart failure prevention instruction: ?Limit salt intake ?Limit fluid intake between 32-64 ounces per day ?Weight yourself every morning after going to the bathroom.  Bring all your medication bottles and weight diary to every cardiology visit. ?

## 2022-01-14 ENCOUNTER — Other Ambulatory Visit (HOSPITAL_COMMUNITY): Payer: Self-pay

## 2022-01-18 ENCOUNTER — Encounter (HOSPITAL_COMMUNITY): Payer: Self-pay

## 2022-01-18 ENCOUNTER — Other Ambulatory Visit: Payer: Self-pay

## 2022-01-18 ENCOUNTER — Other Ambulatory Visit (HOSPITAL_COMMUNITY): Payer: Self-pay

## 2022-01-18 ENCOUNTER — Ambulatory Visit (HOSPITAL_COMMUNITY)
Admission: RE | Admit: 2022-01-18 | Discharge: 2022-01-18 | Disposition: A | Discharge status: Home / Self Care | Payer: Medicaid Other | Source: Ambulatory Visit | Attending: Family Medicine | Admitting: Family Medicine

## 2022-01-18 ENCOUNTER — Telehealth (HOSPITAL_COMMUNITY): Payer: Self-pay | Admitting: Pharmacy Technician

## 2022-01-18 VITALS — BP 148/92 | HR 78 | Ht 72.0 in | Wt 203.0 lb

## 2022-01-18 DIAGNOSIS — I340 Nonrheumatic mitral (valve) insufficiency: Secondary | ICD-10-CM | POA: Diagnosis not present

## 2022-01-18 DIAGNOSIS — I10 Essential (primary) hypertension: Secondary | ICD-10-CM

## 2022-01-18 DIAGNOSIS — I2510 Atherosclerotic heart disease of native coronary artery without angina pectoris: Secondary | ICD-10-CM | POA: Diagnosis not present

## 2022-01-18 DIAGNOSIS — E1122 Type 2 diabetes mellitus with diabetic chronic kidney disease: Secondary | ICD-10-CM | POA: Insufficient documentation

## 2022-01-18 DIAGNOSIS — I493 Ventricular premature depolarization: Secondary | ICD-10-CM | POA: Diagnosis not present

## 2022-01-18 DIAGNOSIS — R946 Abnormal results of thyroid function studies: Secondary | ICD-10-CM | POA: Diagnosis not present

## 2022-01-18 DIAGNOSIS — E039 Hypothyroidism, unspecified: Secondary | ICD-10-CM | POA: Diagnosis not present

## 2022-01-18 DIAGNOSIS — Z79899 Other long term (current) drug therapy: Secondary | ICD-10-CM | POA: Insufficient documentation

## 2022-01-18 DIAGNOSIS — I130 Hypertensive heart and chronic kidney disease with heart failure and stage 1 through stage 4 chronic kidney disease, or unspecified chronic kidney disease: Secondary | ICD-10-CM | POA: Insufficient documentation

## 2022-01-18 DIAGNOSIS — I5022 Chronic systolic (congestive) heart failure: Secondary | ICD-10-CM | POA: Diagnosis not present

## 2022-01-18 DIAGNOSIS — H5440 Blindness, one eye, unspecified eye: Secondary | ICD-10-CM | POA: Diagnosis not present

## 2022-01-18 DIAGNOSIS — N183 Chronic kidney disease, stage 3 unspecified: Secondary | ICD-10-CM | POA: Diagnosis not present

## 2022-01-18 DIAGNOSIS — Z79890 Hormone replacement therapy (postmenopausal): Secondary | ICD-10-CM | POA: Insufficient documentation

## 2022-01-18 DIAGNOSIS — I252 Old myocardial infarction: Secondary | ICD-10-CM | POA: Diagnosis not present

## 2022-01-18 LAB — BASIC METABOLIC PANEL
Anion gap: 8 (ref 5–15)
BUN: 20 mg/dL (ref 8–23)
CO2: 21 mmol/L — ABNORMAL LOW (ref 22–32)
Calcium: 9.5 mg/dL (ref 8.9–10.3)
Chloride: 109 mmol/L (ref 98–111)
Creatinine, Ser: 1.7 mg/dL — ABNORMAL HIGH (ref 0.61–1.24)
GFR, Estimated: 45 mL/min — ABNORMAL LOW (ref >60)
Glucose, Bld: 134 mg/dL — ABNORMAL HIGH (ref 70–99)
Potassium: 4.8 mmol/L (ref 3.5–5.1)
Sodium: 138 mmol/L (ref 135–145)

## 2022-01-18 LAB — DIGOXIN LEVEL: Digoxin Level: 0.5 ng/mL — ABNORMAL LOW (ref 0.8–2.0)

## 2022-01-18 LAB — MAGNESIUM: Magnesium: 1.9 mg/dL (ref 1.7–2.4)

## 2022-01-18 LAB — BRAIN NATRIURETIC PEPTIDE: B Natriuretic Peptide: 667 pg/mL — ABNORMAL HIGH (ref 0.0–100.0)

## 2022-01-18 MED ORDER — HYDRALAZINE HCL 25 MG PO TABS
25.0000 mg | ORAL_TABLET | Freq: Three times a day (TID) | ORAL | 0 refills | Status: DC
  Filled 2022-01-18: qty 90, 30d supply, fill #0

## 2022-01-18 MED ORDER — DAPAGLIFLOZIN PROPANEDIOL 10 MG PO TABS
10.0000 mg | ORAL_TABLET | Freq: Every day | ORAL | 0 refills | Status: DC
  Filled 2022-01-18: qty 30, 30d supply, fill #0

## 2022-01-18 MED ORDER — DIGOXIN 125 MCG PO TABS
0.1250 mg | ORAL_TABLET | Freq: Every day | ORAL | 0 refills | Status: DC
  Filled 2022-01-18: qty 30, 30d supply, fill #0

## 2022-01-18 MED ORDER — ISOSORBIDE MONONITRATE ER 30 MG PO TB24
30.0000 mg | ORAL_TABLET | Freq: Every day | ORAL | 0 refills | Status: DC
  Filled 2022-01-18: qty 30, 30d supply, fill #0

## 2022-01-18 MED ORDER — SPIRONOLACTONE 25 MG PO TABS
25.0000 mg | ORAL_TABLET | Freq: Every day | ORAL | 0 refills | Status: DC
  Filled 2022-01-18: qty 90, 90d supply, fill #0

## 2022-01-18 MED ORDER — ROSUVASTATIN CALCIUM 20 MG PO TABS
20.0000 mg | ORAL_TABLET | Freq: Every day | ORAL | 0 refills | Status: DC
  Filled 2022-01-18: qty 30, 30d supply, fill #0

## 2022-01-18 MED ORDER — SPIRONOLACTONE 25 MG PO TABS
25.0000 mg | ORAL_TABLET | Freq: Every day | ORAL | 0 refills | Status: DC
  Filled 2022-01-18: qty 30, 30d supply, fill #0

## 2022-01-18 MED ORDER — ENTRESTO 49-51 MG PO TABS
1.0000 | ORAL_TABLET | Freq: Two times a day (BID) | ORAL | 11 refills | Status: DC
  Filled 2022-01-18: qty 60, 30d supply, fill #0

## 2022-01-18 MED ORDER — DAPAGLIFLOZIN PROPANEDIOL 10 MG PO TABS
10.0000 mg | ORAL_TABLET | Freq: Every day | ORAL | 0 refills | Status: DC
  Filled 2022-01-18: qty 90, 90d supply, fill #0

## 2022-01-18 MED ORDER — DIGOXIN 125 MCG PO TABS
0.1250 mg | ORAL_TABLET | Freq: Every day | ORAL | 0 refills | Status: DC
Start: 2022-01-18 — End: 2022-03-07
  Filled 2022-01-18: qty 30, 30d supply, fill #0

## 2022-01-18 NOTE — Progress Notes (Addendum)
ADVANCED HF CLINIC CONSULT NOTE  Primary Care: [name], NP HF Cardiologist: Dr. 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  Admitted 5/23 with new acute heart failure. Given IV lasix. Echo showed EF 15-20% range with mildly dilated LV, moderately dilated RV with severely decreased systolic function, moderate-severe functional mitral regurgitation, dilated IVC. Underwent R/LHC showing mildly elevated filling pressures, low CO and 70% proximal LAD stenosis, 90% distal LAD stenosis,  80% stenosis proximally in a moderate D1. Films reviewed with Dr. Martinique and decided not to intervene in the absence of chest pain and presence of diffuse cardiomyopathy, out of proportion to known disease. Started on milrinone and continued with diuresis. Drips weaned off and GDMT titrated. Started on mexiletine with frequent PVCs. Discharged home, weight 201 lbs.  Today he returns for post hospital HF follow up. Overall feeling fine. He is not SOB with stairs of lifting. Denies palpitations, CP, dizziness, edema, or PND/Orthopnea. Appetite ok. No fever or chills. Weight at home 200 pounds. Taking all medications. He is Medicaid pending. Previously worked as a Dealer. 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  ECG (personally reviewed): NSR with PVCs, 85 bpm, QRS 76 msec  Labs (5/23): K 4.4, creatinine 1.82, hgb 12.5   Cardiac Studies: - Echo (5/23): EF 15-20%, moderately dilated RV with severely decreased systolic function, moderate-severe functional mitral regurgitation.   - R/LHC (5/23):   Prox RCA lesion is 30% stenosed.   Mid RCA lesion is 30% stenosed.   RPDA lesion is 40% stenosed.   Prox Cx lesion is 30% stenosed.   Dist LAD lesion is 90% stenosed.   Mid LAD lesion is 30% stenosed.   Prox LAD to Mid LAD lesion is 70% stenosed.   1st Diag lesion is 80% stenosed.   1. Elevated,  but not markedly elevated, filling pressures.  2. Low cardiac output (CI 1.89).  3. 70% proximal LAD stenosis, 90% distal LAD stenosis.  80% stenosis proximally in a moderate D1.    Films reviewed with Dr. Martinique.  The proximal LAD lesion is not critical but looks to be around 70% (moderate stenosis).  This could potentially be intervened upon, as could the 1st diagonal (would not intervention on the severe far distal LAD stenosis). However, patient reports no chest pain and his cardiomyopathy is diffuse and not explained by the moderate LAD disease. 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    Review of Systems: [y] = yes, _0  = no   General: Weight gain _1 ; Weight loss _2 ; Anorexia _3 ; Fatigue _4 ; Fever _5 ; Chills _6 ; Weakness _7   Cardiac: Chest pain/pressure _8 ; Resting SOB _9 ; Exertional SOB _10 ; Orthopnea _11 ; Pedal Edema _12 ; Palpitations _13 ; Syncope _14 ; Presyncope _15 ; Paroxysmal nocturnal dyspnea_16   Pulmonary: Cough _17 ; Wheezing_18 ; Hemoptysis_19 ; Sputum _20 ; Snoring _21   GI: Vomiting_22 ; Dysphagia_23 ; Melena_24 ; Hematochezia _25 ; Heartburn_26 ; Abdominal pain _27 ; Constipation _28 ; Diarrhea _29 ; BRBPR _30   GU: Hematuria_31 ; Dysuria _32 ; Nocturia_33   Vascular: Pain in legs with walking _34 ; Pain in feet with lying flat _35 ; Non-healing sores _36 ; Stroke _37 ; TIA _38 ; Slurred speech _39 ;  Neuro: Headaches_40 ; Vertigo_41 ;  Seizures_0 ; Paresthesias_1 ;Blurred vision _2 ; Diplopia _3 ; Vision changes _4   Ortho/Skin: Arthritis _5 ; Joint pain _6 ; Muscle pain _7 ; Joint swelling _8 ; Back Pain _9 ; Rash _10   Psych: Depression_11 ; Anxiety_12   Heme: Bleeding problems _13 ; Clotting disorders _14 ; Anemia _15   Endocrine: Diabetes Blue.Reese ]; Thyroid dysfunction[ y]  Past Medical History:  Diagnosis Date   Acid reflux    Diabetes mellitus without complication (HCC)    Gout    Current Outpatient Medications  Medication Sig  Dispense Refill   aspirin EC 81 MG EC tablet Take 1 tablet (81 mg total) by mouth daily. Swallow whole.     Blood Glucose Monitoring Suppl (TRUE METRIX METER) W/DEVICE KIT 1 each by Does not apply route 3 (three) times daily. 1 kit 0   dapagliflozin propanediol (FARXIGA) 10 MG TABS tablet Take 1 tablet (10 mg total) by mouth daily. 30 tablet 11   digoxin (LANOXIN) 0.125 MG tablet Take 1 tablet (0.125 mg total) by mouth daily. 90 tablet 1   glucose blood test strip Use as instructed 100 each 12   hydrALAZINE (APRESOLINE) 25 MG tablet Take 1 tablet (25 mg total) by mouth every 8 (eight) hours. 90 tablet 5   isosorbide mononitrate (IMDUR) 30 MG 24 hr tablet Take 1 tablet (30 mg total) by mouth daily. 90 tablet 1   levothyroxine (SYNTHROID, LEVOTHROID) 75 MCG tablet Take 1 tablet (75 mcg total) by mouth daily. 30 tablet 2   metFORMIN (GLUCOPHAGE) 1000 MG tablet Take 1,000 mg by mouth 2 (two) times daily with a meal.     mexiletine (MEXITIL) 150 MG capsule Take 1 capsule (150 mg total) by mouth every 12 (twelve) hours. 60 capsule 5   rosuvastatin (CRESTOR) 20 MG tablet Take 1 tablet (20 mg total) by mouth daily. 90 tablet 3   sacubitril-valsartan (ENTRESTO) 24-26 MG Take 1 tablet by mouth 2 (two) times daily. 60 tablet 11   spironolactone (ALDACTONE) 25 MG tablet Take 1 tablet (25 mg total) by mouth daily. 90 tablet 1   No current facility-administered medications for this encounter.   No Known Allergies  Social History   Socioeconomic History   Marital status: Single    Spouse name: Not on file   Number of children: Not on file   Years of education: Not on file   Highest education level: Not on file  Occupational History   Not on file  Tobacco Use   Smoking status: Never   Smokeless tobacco: Never  Substance and Sexual Activity   Alcohol use: No   Drug use: No   Sexual activity: Yes  Other Topics Concern   Not on file  Social History Narrative   Not on file   Social Determinants  of Health   Financial Resource Strain: Not on file  Food Insecurity: Not on file  Transportation Needs: Not on file  Physical Activity: Not on file  Stress: Not on file  Social Connections: Not on file  Intimate Partner Violence: Not on file   Family History  Problem Relation Age of Onset   Heart attack Father    Diabetes Mellitus II Neg Hx    CAD Neg Hx    BP (!) 148/92   Pulse 78 Comment: irregular  Ht 6' (1.829 m)   Wt 92.1 kg   SpO2 98%   BMI 27.53 kg/m   Wt Readings from Last 3 Encounters:  01/18/22 92.1  kg  01/13/22 91.4 kg  12/02/18 99.8 kg   PHYSICAL EXAM: General:  NAD. No resp difficulty HEENT: Blind in L eye Neck: Supple. No JVD. Carotids 2+ bilat; no bruits. No lymphadenopathy or thryomegaly appreciated. Cor: PMI nondisplaced. Regular rate & rhythm. No rubs, gallops or murmurs. Lungs: Clear Abdomen: Soft, nontender, nondistended. No hepatosplenomegaly. No bruits or masses. Good bowel sounds. Extremities: No cyanosis, clubbing, rash, edema Neuro: Alert & oriented x 3, cranial nerves grossly intact. Moves all 4 extremities w/o difficulty. Affect pleasant.  ASSESSMENT & PLAN: 1. Chronic systolic CHF: Initial diagnosis of heart failure, symptoms present for at least a month. NYHA class IV at admission.  Echo this admission with EF 15-20% range with mildly dilated LV, moderately dilated RV with severely decreased systolic function, moderate-severe functional mitral regurgitation, dilated IVC.  Does not have history of ETOH or drugs.  No strong FH of cardiomyopathy.  Has hypothyroidism with mildly elevated TSH (on Levoxyl).  Creatinine noted to be elevated, possible cardiorenal contribution but also has history of diabetes. R showed elevated filling pressures and low cardiac output (CI 1.89)>>started on Milrinone. LHC showed moderate LAD disease, however CMP out of proportion to degree of CAD. 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 Plan cMRI to look for infiltrative  disease, myocarditis, etc.  Also could perhaps show coronary-pattern scar suggestive of prior MI with some recanalization of the LAD spontaneously. Also ? PVCs contributing to CMP.  NYHA II, he is not volume overloaded today. - He does not appear to need Lasix.  - Increase Entresto to 49/51 mg bid. BMET/BNP today, repeat BMET in 10 days. - Continue dapagliflozin 10 daily.   - Continue hydralazine 25 mg tid + Imdur 30 mg daily. - Continue spironolactone 25 daily.  - Continue digoxin 0.125 mg daily. Check level today.  - Arrange cMRI to look for infiltrative disease. - Narrow QRS, would not be CRT candidate.  2. CAD: Mod LAD and Diag disease. The proximal LAD lesion is not critical but looks to be around 70% (moderate stenosis).  This could potentially be intervened upon, as could the 1st diagonal (would not intervene on the severe far distal LAD stenosis).  However, patient reports no chest pain and his cardiomyopathy is diffuse and not explained by the moderate LAD disease. Would manage medically for now unless he develops chest pain.  - Continue ASA + statin. No chest pain.  - Arrange cMRI to look for coronary pattern scar. Insurance may be a barrier. 3. CKD stage 3: suspect baseline diabetic nephropathy. BMET today. - Continue Farxiga.  4. Diabetes: Type 2. On SGLT2i. 5. HTN: Increase Entresto as above.  6. Mitral regurgitation: Dr. Aundra Dubin reviewed echo, MR is in the moderate-severe range and likely functional.  This may improve over time with GDMT but if not, may be mTEER candidate in future.  7. PVCs: Frequent during recent admission. Asymptomatic. Present on ECG today. Consider Zio next visit to quantify. - Continue mexiletine 150 mg bid. May need to increase to 200 mg next. Check magnesium today.  - Consider sleep study when he has insurance.   Follow up in 3 weeks with PharmD (add beta blocker), 6 weeks with APP, and 12 weeks with Dr. Aundra Dubin + echo.  Allena Katz,  FNP-BC 01/18/22

## 2022-01-18 NOTE — Patient Instructions (Addendum)
Labs done today. We will contact you only if your labs are abnormal.  INCREASE Entresto to 49-51mg  (1 tablet) by mouth 2 times daily.   No other medication changes were made. Please continue all current medications as prescribed.  Your physician recommends that you schedule a follow-up appointment in: 10 days for a lab only appointment, 3 weeks with our Pharmacy Clinic, 6 weeks with our NP/PA Clinic and in 3 months with Dr. Shirlee Latch with an echo prior to your exam.   Your physician has requested that you have an echocardiogram. Echocardiography is a painless test that uses sound waves to create images of your heart. It provides your doctor with information about the size and shape of your heart and how well your heart's chambers and valves are working. This procedure takes approximately one hour. There are no restrictions for this procedure.  If you have any questions or concerns before your next appointment please send [place] a message through [facility] or call our office at [phone].    TO LEAVE A MESSAGE FOR THE NURSE SELECT OPTION 2, PLEASE LEAVE A MESSAGE INCLUDING: YOUR NAME DATE OF BIRTH CALL BACK NUMBER REASON FOR CALL**this is important as we prioritize the call backs  YOU WILL RECEIVE A CALL BACK THE SAME DAY AS LONG AS YOU CALL BEFORE 4:00 PM   Do the following things EVERYDAY: Weigh yourself in the morning before breakfast. Write it down and keep it in a log. Take your medicines as prescribed Eat low salt foods--Limit salt (sodium) to 2000 mg per day.  Stay as active as you can everyday Limit all fluids for the day to less than 2 liters   At the [facility], you and your health needs are our priority. As part of our continuing mission to provide you with exceptional heart care, we have created designated Provider Care Teams. These Care Teams include your primary Cardiologist (physician) and Advanced Practice Providers (APPs- Physician Assistants and Nurse  Practitioners) who all work together to provide you with the care you need, when you need it.   You may see any of the following providers on your designated Care Team at your next follow up: Dr Arvilla Meres Dr Carron Curie, NP Robbie Lis, [place] Karle Plumber, PharmD   Please be sure to bring in all your medications bottles to every appointment.

## 2022-01-18 NOTE — Telephone Encounter (Signed)
Advanced Heart Failure Patient Advocate Encounter  Patient was seen in clinic today and needs help affording Entresto.   The patient is currently uninsured. Started an Landscape architect for [employer]. Patient is aware that POI is required to gain approval.    Will fax in once signatures are obtained.

## 2022-01-18 NOTE — Addendum Note (Signed)
Encounter addended by: Jacklynn Ganong, FNP on: 01/18/2022 4:30 PM  Actions taken: Clinical Note Signed

## 2022-01-18 NOTE — Progress Notes (Signed)
Medication Samples have been provided to the patient.  Drug name: Sherryll Burger       Strength: 49-51mg         Qty: 4  LOT: [id number]  Exp.Date: 09/2023  Dosing instructions: 1 tab po bid  The patient has been instructed regarding the correct time, dose, and frequency of taking this medication, including desired effects and most common side effects.   Jahmia Berrett R Nanci Lakatos 3:56 PM 01/18/2022

## 2022-01-23 ENCOUNTER — Other Ambulatory Visit (HOSPITAL_COMMUNITY): Payer: Self-pay

## 2022-01-23 MED ORDER — ENTRESTO 49-51 MG PO TABS
1.0000 | ORAL_TABLET | Freq: Two times a day (BID) | ORAL | 3 refills | Status: DC
Start: 2022-01-23 — End: 2022-05-01

## 2022-01-23 NOTE — Telephone Encounter (Signed)
Sent in application via fax. NO POI was attached, will initially be denied.   Will follow up.

## 2022-01-30 ENCOUNTER — Other Ambulatory Visit (HOSPITAL_COMMUNITY): Payer: Medicaid Other

## 2022-01-30 NOTE — Progress Notes (Incomplete)
***In Progress***    Advanced Heart Failure Clinic Note   Primary Care: [name], NP HF Cardiologist: Dr. 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   Admitted 12/2021 with new acute heart failure. Given IV Lasix. Echo showed EF 15-20% range with mildly dilated LV, moderately dilated RV with severely decreased systolic function, moderate-severe functional mitral regurgitation, dilated IVC. Underwent R/LHC showing mildly elevated filling pressures, low CO and 70% proximal LAD stenosis, 90% distal LAD stenosis,  80% stenosis proximally in a moderate D1. Films reviewed with Dr. Martin[place] and decided not to intervene in the absence of chest pain and presence of diffuse cardiomyopathy, out of proportion to known disease. Started on milrinone and continued with diuresis. Drips weaned off and GDMT titrated. Started on mexiletine with frequent PVCs. Discharged home, weight 201 lbs.   Presented to AHF Clinic for post hospital HF follow up 01/18/22. Overall was feeling fine. He was not SOB with stairs or lifting. Denied palpitations, CP, dizziness, edema, or PND/Orthopnea. Appetite was ok. No fever or chills. Weight at home was 200 pounds. Reported taking all medications. His Medicaid application was still pending. Previously worked as a Dealer. Stated that he does not snore.  Today he returns to HF clinic for pharmacist medication titration. At last visit with APP, Delene Loll was increased to 49/51 mg BID.   Shortness of breath/dyspnea on exertion? {YES NO:22349}  Orthopnea/PND? {YES NO:22349} Edema? {YES NO:22349} Lightheadedness/dizziness? {YES NO:22349} Daily weights at home? {YES NO:22349} Blood pressure/heart rate monitoring at home? {YES V2345720 Following low-sodium/fluid-restricted diet? {YES NO:22349}  HF Medications: Entresto 49/51 mg BID Spironolactone  25 mg daily Farxiga 10 mg daily Hydralazine 25 mg TID Imdur 30 mg daily Digoxin 0.125 mg daily  Has the patient been experiencing any side effects to the medications prescribed?  {YES NO:22349}  Does the patient have any problems obtaining medications due to transportation or finances?   {YES NO:22349}  Understanding of regimen: {excellent/good/fair/poor:19665} Understanding of indications: {excellent/good/fair/poor:19665} Potential of compliance: {excellent/good/fair/poor:19665} Patient understands to avoid NSAIDs. Patient understands to avoid decongestants.    Pertinent Lab Values: Serum creatinine ***, BUN ***, Potassium ***, Sodium ***, BNP ***, Magnesium ***, Digoxin ***   Vital Signs: Weight: *** (last clinic weight: ***) Blood pressure: ***  Heart rate: ***   Assessment/Plan: 1. Chronic systolic CHF: Initial diagnosis of heart failure, symptoms present for at least a month. NYHA class IV at admission.  Echo this admission with EF 15-20% range with mildly dilated LV, moderately dilated RV with severely decreased systolic function, moderate-severe functional mitral regurgitation, dilated IVC.  Does not have history of ETOH or drugs.  No strong FH of cardiomyopathy.  Has hypothyroidism with mildly elevated TSH (on Levoxyl).  Creatinine noted to be elevated, possible cardiorenal contribution but also has history of diabetes. R showed elevated filling pressures and low cardiac output (CI 1.89)>>started on Milrinone. LHC showed moderate LAD disease, however CMP out of proportion to degree of CAD. 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 Plan cMRI to look for infiltrative disease, myocarditis, etc.  Also could perhaps show coronary-pattern scar suggestive of prior MI with some recanalization of the LAD spontaneously. Also ? PVCs contributing to CMP.   - NYHA II, he is not volume overloaded today. - He does not appear to need Lasix.  - Continue Entresto 49/51 mg BID - Continue  spironolactone 25 mg daily.  -  Continue dapagliflozin 10 mg daily.   - Continue hydralazine 25 mg TID + Imdur 30 mg daily. - Continue digoxin 0.125 mg daily.  - Arrange cMRI to look for infiltrative disease. - Narrow QRS, would not be CRT candidate.  2. CAD: Mod LAD and Diag disease. The proximal LAD lesion is not critical but looks to be around 70% (moderate stenosis).  This could potentially be intervened upon, as could the 1st diagonal (would not intervene on the severe far distal LAD stenosis).  However, patient reports no chest pain and his cardiomyopathy is diffuse and not explained by the moderate LAD disease. Would manage medically for now unless he develops chest pain.  - Continue ASA + statin. No chest pain.  - Will need cMRI to look for coronary pattern scar. Insurance may be a barrier, currently uninsured. 3. CKD stage 3: suspect baseline diabetic nephropathy.  - Continue Farxiga.  4. Diabetes: Type 2. On SGLT2i. 5. HTN:  - Continue current regimen.  6. Mitral regurgitation: Dr. Aundra Dubin reviewed echo, MR is in the moderate-severe range and likely functional.  This may improve over time with GDMT but if not, may be mTEER candidate in future.  7. PVCs: Frequent during recent admission. Asymptomatic. Present on ECG 01/18/22. Consider Zio next visit to quantify. - Continue mexiletine 150 mg BID.    - Consider sleep study when he has insurance.  Follow up ***   Audry Riles, PharmD, BCPS, BCCP, CPP Heart Failure Clinic Pharmacist [phone]

## 2022-02-01 NOTE — Telephone Encounter (Signed)
Advanced Heart Failure Patient Advocate Encounter  Spoke with patient regarding POI requirement for [employer] assistance. Patient states he has the income attestation letter but has not filled it in. I advised him that he can send it in after its completed or that we could send it in on his behalf.  Archer Asa, CPhT

## 2022-02-12 ENCOUNTER — Inpatient Hospital Stay (HOSPITAL_COMMUNITY): Admission: RE | Admit: 2022-02-12 | Payer: Medicaid Other | Source: Ambulatory Visit

## 2022-02-18 NOTE — Progress Notes (Signed)
Advanced Heart Failure Clinic Note   Primary Care: Hortencia Conradi, NP HF Cardiologist: Dr. 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   Admitted 12/2021 with new acute heart failure. Given IV Lasix. Echo showed EF 15-20% range with mildly dilated LV, moderately dilated RV with severely decreased systolic function, moderate-severe functional mitral regurgitation, dilated IVC. Underwent R/LHC showing mildly elevated filling pressures, low CO and 70% proximal LAD stenosis, 90% distal LAD stenosis,  80% stenosis proximally in a moderate D1. Films reviewed with Dr. [place] and decided not to intervene in the absence of chest pain and presence of diffuse cardiomyopathy, out of proportion to known disease. Started on milrinone and continued with diuresis. Drips weaned off and GDMT titrated. Started on mexiletine with frequent PVCs. Discharged home, weight 201 lbs.   Presented to AHF Clinic for post hospital HF follow up 01/18/22. Overall was feeling fine. He was not SOB with stairs or lifting. Denied palpitations, CP, dizziness, edema, or PND/Orthopnea. Appetite was ok. No fever or chills. Weight at home was 200 pounds. Reported taking all medications. His Medicaid application was still pending. 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  Today he returns to HF clinic for pharmacist medication titration. At last visit with APP, Sherryll Burger was increased to 49/51 mg BID. Overall he is feeling great today. No dizziness or lightheadedness. No CP or palpitations. No SOB/DOE. Has been walking to remain active and can walk "as long as he wants to". Weight at home has been stable at 200-205 lbs. Weight is up 4 lbs since last clinic visit, which he attributes to an increased appetite and that he had just eaten before the visit. He does not need a loop diuretic. No LEE,  PND or orthopnea. He is currently uninsured and was only approved for [employer]. [employer] PAP is pending, he will bring [place] the filled out "No Income Attestation" letter to send in on his behalf.     HF Medications: Entresto 49/51 mg BID Spironolactone 25 mg daily Farxiga 10 mg daily Hydralazine 25 mg TID Imdur 30 mg daily Digoxin 0.125 mg daily  Has the patient been experiencing any side effects to the medications prescribed?  no  Does the patient have any problems obtaining medications due to transportation or finances?   No insurance. [employer] PAP for [employer] pending completed "No Income Research scientist (medical)". He will complete and bring in to the clinic for [place] to send in. Provided 2 bottles of Entresto samples for him today to bridge him. All other HF medications sent to [facility], will use HF Fund.   Understanding of regimen: fair Understanding of indications: fair Potential of compliance: good Patient understands to avoid NSAIDs. Patient understands to avoid decongestants.    Pertinent Lab Values: 01/18/22: Serum creatinine 1.70, BUN 20, Potassium 4.8, Sodium 138, BNP 667, Magnesium 1.9, Digoxin 0.5 ng/mL  BMET today pending  Vital Signs: Weight: 207.6 lbs (last clinic weight: 203 lbs) Blood pressure: 146/76  Heart rate: 87   Assessment/Plan: 1. Chronic systolic CHF: Initial diagnosis of heart failure, symptoms present for at least a month. NYHA class IV at admission.  Echo this admission with EF 15-20% range with mildly dilated LV, moderately dilated RV with severely decreased systolic function, moderate-severe functional mitral regurgitation, dilated IVC.  Does not have history of ETOH or drugs.  No strong FH  of cardiomyopathy.  Has hypothyroidism with mildly elevated TSH (on Levoxyl).  Creatinine noted to be elevated, possible cardiorenal contribution but also has history of diabetes. R showed elevated filling pressures and low cardiac output (CI 1.89)>>started on  Milrinone. LHC showed moderate LAD disease, however CMP out of proportion to degree of CAD. 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 Plan cMRI to look for infiltrative disease, myocarditis, etc.  Also could perhaps show coronary-pattern scar suggestive of prior MI with some recanalization of the LAD spontaneously. Also ? PVCs contributing to CMP.   - NYHA II, he is not volume overloaded today. - BMET today pending - He does not appear to need Lasix.  - Start carvedilol 3.125 mg BID - Continue Entresto 49/51 mg BID. Plan to increase to goal dose once approved for [employer] PAP (in progress).  - Continue spironolactone 25 mg daily.  - Continue dapagliflozin 10 mg daily.   - Increase hydralazine to 50 mg TID  and continue Imdur 30 mg daily. - Continue digoxin 0.125 mg daily.  - Will need cMRI to look for infiltrative disease. Lack of insurance may be a barrier.  - Narrow QRS, would not be CRT candidate.  2. CAD: Mod LAD and Diag disease. The proximal LAD lesion is not critical but looks to be around 70% (moderate stenosis).  This could potentially be intervened upon, as could the 1st diagonal (would not intervene on the severe far distal LAD stenosis).  However, patient reports no chest pain and his cardiomyopathy is diffuse and not explained by the moderate LAD disease. Would manage medically for now unless he develops chest pain.  - Continue ASA + statin. No chest pain.  - Will need cMRI to look for coronary pattern scar. Insurance may be a barrier, currently uninsured. 3. CKD stage 3: suspect baseline diabetic nephropathy.  - Continue Farxiga.  4. Diabetes: Type 2. On SGLT2i. 5. HTN:  - Continue current regimen.  6. Mitral regurgitation: Dr. Shirlee Latch reviewed echo, MR is in the moderate-severe range and likely functional.  This may improve over time with GDMT but if not, may be mTEER candidate in future.  7. PVCs: Frequent during recent admission. Asymptomatic. Present on ECG 01/18/22. Consider  Zio next visit to quantify. - Continue mexiletine 150 mg BID.    - Consider sleep study when he has insurance.  Follow up 1 month with Dr. Jonn Shingles, PharmD, BCPS, [facility], CPP Heart Failure Clinic Pharmacist [phone]

## 2022-03-07 ENCOUNTER — Telehealth (HOSPITAL_COMMUNITY): Payer: Self-pay | Admitting: Pharmacist

## 2022-03-07 ENCOUNTER — Other Ambulatory Visit (HOSPITAL_COMMUNITY): Payer: Self-pay

## 2022-03-07 ENCOUNTER — Ambulatory Visit (HOSPITAL_COMMUNITY)
Admission: RE | Admit: 2022-03-07 | Discharge: 2022-03-07 | Disposition: A | Discharge status: Home / Self Care | Payer: Medicaid Other | Source: Ambulatory Visit | Attending: Cardiology | Admitting: Cardiology

## 2022-03-07 VITALS — BP 146/76 | HR 87 | Wt 207.6 lb

## 2022-03-07 DIAGNOSIS — Z79899 Other long term (current) drug therapy: Secondary | ICD-10-CM | POA: Diagnosis not present

## 2022-03-07 DIAGNOSIS — I130 Hypertensive heart and chronic kidney disease with heart failure and stage 1 through stage 4 chronic kidney disease, or unspecified chronic kidney disease: Secondary | ICD-10-CM | POA: Diagnosis not present

## 2022-03-07 DIAGNOSIS — H5440 Blindness, one eye, unspecified eye: Secondary | ICD-10-CM | POA: Diagnosis not present

## 2022-03-07 DIAGNOSIS — I2510 Atherosclerotic heart disease of native coronary artery without angina pectoris: Secondary | ICD-10-CM | POA: Insufficient documentation

## 2022-03-07 DIAGNOSIS — I5022 Chronic systolic (congestive) heart failure: Secondary | ICD-10-CM | POA: Insufficient documentation

## 2022-03-07 DIAGNOSIS — E1122 Type 2 diabetes mellitus with diabetic chronic kidney disease: Secondary | ICD-10-CM | POA: Insufficient documentation

## 2022-03-07 DIAGNOSIS — Z7982 Long term (current) use of aspirin: Secondary | ICD-10-CM | POA: Insufficient documentation

## 2022-03-07 DIAGNOSIS — M109 Gout, unspecified: Secondary | ICD-10-CM | POA: Diagnosis not present

## 2022-03-07 DIAGNOSIS — Z7901 Long term (current) use of anticoagulants: Secondary | ICD-10-CM | POA: Diagnosis not present

## 2022-03-07 DIAGNOSIS — Z7984 Long term (current) use of oral hypoglycemic drugs: Secondary | ICD-10-CM | POA: Diagnosis not present

## 2022-03-07 DIAGNOSIS — N183 Chronic kidney disease, stage 3 unspecified: Secondary | ICD-10-CM | POA: Diagnosis not present

## 2022-03-07 DIAGNOSIS — I340 Nonrheumatic mitral (valve) insufficiency: Secondary | ICD-10-CM | POA: Diagnosis not present

## 2022-03-07 DIAGNOSIS — E039 Hypothyroidism, unspecified: Secondary | ICD-10-CM | POA: Insufficient documentation

## 2022-03-07 LAB — BASIC METABOLIC PANEL
Anion gap: 10 (ref 5–15)
BUN: 21 mg/dL (ref 8–23)
CO2: 19 mmol/L — ABNORMAL LOW (ref 22–32)
Calcium: 9 mg/dL (ref 8.9–10.3)
Chloride: 108 mmol/L (ref 98–111)
Creatinine, Ser: 1.56 mg/dL — ABNORMAL HIGH (ref 0.61–1.24)
GFR, Estimated: 50 mL/min — ABNORMAL LOW (ref >60)
Glucose, Bld: 212 mg/dL — ABNORMAL HIGH (ref 70–99)
Potassium: 4.1 mmol/L (ref 3.5–5.1)
Sodium: 137 mmol/L (ref 135–145)

## 2022-03-07 MED ORDER — DAPAGLIFLOZIN PROPANEDIOL 10 MG PO TABS
10.0000 mg | ORAL_TABLET | Freq: Every day | ORAL | 1 refills | Status: DC
  Filled 2022-03-07: qty 30, 30d supply, fill #0

## 2022-03-07 MED ORDER — DIGOXIN 125 MCG PO TABS
0.1250 mg | ORAL_TABLET | Freq: Every day | ORAL | 1 refills | Status: DC
  Filled 2022-03-07: qty 30, 30d supply, fill #0

## 2022-03-07 MED ORDER — HYDRALAZINE HCL 50 MG PO TABS
50.0000 mg | ORAL_TABLET | Freq: Three times a day (TID) | ORAL | 1 refills | Status: DC
  Filled 2022-03-07: qty 90, 30d supply, fill #0

## 2022-03-07 MED ORDER — ROSUVASTATIN CALCIUM 20 MG PO TABS
20.0000 mg | ORAL_TABLET | Freq: Every day | ORAL | 0 refills | Status: DC
  Filled 2022-03-07: qty 30, 30d supply, fill #0

## 2022-03-07 MED ORDER — CARVEDILOL 3.125 MG PO TABS
3.1250 mg | ORAL_TABLET | Freq: Two times a day (BID) | ORAL | 1 refills | Status: DC
  Filled 2022-03-07: qty 60, 30d supply, fill #0

## 2022-03-07 MED ORDER — SPIRONOLACTONE 25 MG PO TABS
25.0000 mg | ORAL_TABLET | Freq: Every day | ORAL | 0 refills | Status: DC
Start: 2022-03-07 — End: 2022-05-01
  Filled 2022-03-07: qty 30, 30d supply, fill #0

## 2022-03-07 MED ORDER — ISOSORBIDE MONONITRATE ER 30 MG PO TB24
30.0000 mg | ORAL_TABLET | Freq: Every day | ORAL | 1 refills | Status: DC
  Filled 2022-03-07: qty 30, 30d supply, fill #0

## 2022-03-07 NOTE — Patient Instructions (Signed)
It was a pleasure seeing you today!  MEDICATIONS: -We are changing your medications today -Start carvedilol 3.125 mg (1 tablet) twice daily -Increase hydralazine to 50 mg (1 tablet) three times daily -Please bring in "No income attestation letter" so we can help you get Entresto for free from the manufacturer [employer]) -Call if you have questions about your medications.  LABS: -We will call you if your labs need attention.  NEXT APPOINTMENT: Return to clinic in 1 month with Dr. Shirlee Latch.  In general, to take care of your heart failure: -Limit your fluid intake to 2 Liters (half-gallon) per day.   -Limit your salt intake to ideally 2-3 grams (2000-3000 mg) per day. -Weigh yourself daily and record, and bring that "weight diary" to your next appointment.  (Weight gain of 2-3 pounds in 1 day typically means fluid weight.) -The medications for your heart are to help your heart and help you live longer.   -Please contact us before stopping any of your heart medications.  Call the clinic at [phone] with questions or to reschedule future appointments.

## 2022-03-07 NOTE — Telephone Encounter (Signed)
Medication Samples have been provided to the patient.   Drug name: Sherryll Burger     Strength: 49/51      Qty: 2 bottles                 LOT: [id number] Exp.Date: 03/2024   Dosing instructions: 1 tablet BID   The patient has been instructed regarding the correct time, dose, and frequency of taking this medication, including desired effects and most common side effects.   Karle Plumber, PharmD, BCPS, CPP Heart Failure Clinic Pharmacist [phone]

## 2022-05-01 ENCOUNTER — Other Ambulatory Visit: Payer: Self-pay

## 2022-05-01 ENCOUNTER — Ambulatory Visit (HOSPITAL_BASED_OUTPATIENT_CLINIC_OR_DEPARTMENT_OTHER)
Admission: RE | Admit: 2022-05-01 | Discharge: 2022-05-01 | Disposition: A | Discharge status: Home / Self Care | Payer: Medicaid Other | Source: Ambulatory Visit | Attending: Cardiology | Admitting: Cardiology

## 2022-05-01 ENCOUNTER — Other Ambulatory Visit (HOSPITAL_COMMUNITY): Payer: Self-pay

## 2022-05-01 ENCOUNTER — Telehealth (HOSPITAL_COMMUNITY): Payer: Self-pay | Admitting: Pharmacist

## 2022-05-01 ENCOUNTER — Ambulatory Visit (HOSPITAL_COMMUNITY)
Admission: RE | Admit: 2022-05-01 | Discharge: 2022-05-01 | Disposition: A | Discharge status: Home / Self Care | Payer: Medicaid Other | Source: Ambulatory Visit | Attending: Cardiology | Admitting: Cardiology

## 2022-05-01 ENCOUNTER — Encounter (HOSPITAL_COMMUNITY): Payer: Self-pay | Admitting: Cardiology

## 2022-05-01 VITALS — BP 148/88 | HR 64 | Wt 208.2 lb

## 2022-05-01 DIAGNOSIS — I5022 Chronic systolic (congestive) heart failure: Secondary | ICD-10-CM

## 2022-05-01 LAB — BASIC METABOLIC PANEL
Anion gap: 7 (ref 5–15)
BUN: 16 mg/dL (ref 8–23)
CO2: 25 mmol/L (ref 22–32)
Calcium: 9 mg/dL (ref 8.9–10.3)
Chloride: 108 mmol/L (ref 98–111)
Creatinine, Ser: 1.28 mg/dL — ABNORMAL HIGH (ref 0.61–1.24)
GFR, Estimated: >60 mL/min (ref >60)
Glucose, Bld: 155 mg/dL — ABNORMAL HIGH (ref 70–99)
Potassium: 4.1 mmol/L (ref 3.5–5.1)
Sodium: 140 mmol/L (ref 135–145)

## 2022-05-01 LAB — CBC
HCT: 39.9 % (ref 39.0–52.0)
Hemoglobin: 13.9 g/dL (ref 13.0–17.0)
MCH: 31 pg (ref 26.0–34.0)
MCHC: 34.8 g/dL (ref 30.0–36.0)
MCV: 88.9 fL (ref 80.0–100.0)
Platelets: 129 10*3/uL — ABNORMAL LOW (ref 150–400)
RBC: 4.49 MIL/uL (ref 4.22–5.81)
RDW: 14.1 % (ref 11.5–15.5)
WBC: 4.6 10*3/uL (ref 4.0–10.5)
nRBC: 0 % (ref 0.0–0.2)

## 2022-05-01 LAB — BRAIN NATRIURETIC PEPTIDE: B Natriuretic Peptide: 610.8 pg/mL — ABNORMAL HIGH (ref 0.0–100.0)

## 2022-05-01 LAB — ECHOCARDIOGRAM COMPLETE
Area-P 1/2: 3.48 cm2
Calc EF: 36.5 %
MV M vel: 6.53 m/s
MV Peak grad: 170.6 mmHg
Radius: 0.4 cm
S' Lateral: 4.7 cm
Single Plane A2C EF: 34.7 %
Single Plane A4C EF: 39.7 %

## 2022-05-01 MED ORDER — ISOSORBIDE MONONITRATE ER 30 MG PO TB24
30.0000 mg | ORAL_TABLET | Freq: Every day | ORAL | 3 refills | Status: DC
  Filled 2022-05-01 (×2): qty 90, 90d supply, fill #0
  Filled 2022-07-18: qty 90, 90d supply, fill #1
  Filled 2022-09-27 (×2): qty 90, 90d supply, fill #2

## 2022-05-01 MED ORDER — SPIRONOLACTONE 25 MG PO TABS
25.0000 mg | ORAL_TABLET | Freq: Every day | ORAL | 3 refills | Status: DC
  Filled 2022-05-01 – 2022-07-18 (×3): qty 90, 90d supply, fill #0

## 2022-05-01 MED ORDER — DIGOXIN 125 MCG PO TABS
0.1250 mg | ORAL_TABLET | Freq: Every day | ORAL | 3 refills | Status: DC
  Filled 2022-05-01 (×2): qty 90, 90d supply, fill #0
  Filled 2022-07-18: qty 90, 90d supply, fill #1

## 2022-05-01 MED ORDER — ROSUVASTATIN CALCIUM 20 MG PO TABS
20.0000 mg | ORAL_TABLET | Freq: Every day | ORAL | 3 refills | Status: DC
  Filled 2022-05-01 (×2): qty 90, 90d supply, fill #0
  Filled 2022-07-18: qty 90, 90d supply, fill #1
  Filled 2022-09-27 (×2): qty 90, 90d supply, fill #2

## 2022-05-01 MED ORDER — MEXILETINE HCL 150 MG PO CAPS
150.0000 mg | ORAL_CAPSULE | Freq: Two times a day (BID) | ORAL | 3 refills | Status: DC
  Filled 2022-05-01 (×2): qty 180, 90d supply, fill #0

## 2022-05-01 MED ORDER — HYDRALAZINE HCL 50 MG PO TABS
50.0000 mg | ORAL_TABLET | Freq: Three times a day (TID) | ORAL | 3 refills | Status: DC
  Filled 2022-05-01 (×2): qty 270, 90d supply, fill #0
  Filled 2022-07-18: qty 270, 90d supply, fill #1
  Filled 2022-09-27 (×2): qty 270, 90d supply, fill #2

## 2022-05-01 MED ORDER — ASPIRIN 81 MG PO TBEC: 81.0000 mg | DELAYED_RELEASE_TABLET | Freq: Every day | ORAL | Status: AC

## 2022-05-01 MED ORDER — DAPAGLIFLOZIN PROPANEDIOL 10 MG PO TABS
10.0000 mg | ORAL_TABLET | Freq: Every day | ORAL | 3 refills | Status: DC
  Filled 2022-05-01 (×2): qty 90, 90d supply, fill #0
  Filled 2022-07-18: qty 90, 90d supply, fill #1

## 2022-05-01 MED ORDER — ENTRESTO 49-51 MG PO TABS
1.0000 | ORAL_TABLET | Freq: Two times a day (BID) | ORAL | 3 refills | Status: DC
Start: 2022-05-01 — End: 2022-05-22
  Filled 2022-05-01 (×2): qty 180, 90d supply, fill #0

## 2022-05-01 MED ORDER — CARVEDILOL 3.125 MG PO TABS
3.1250 mg | ORAL_TABLET | Freq: Two times a day (BID) | ORAL | 3 refills | Status: DC
  Filled 2022-05-01 (×2): qty 180, 90d supply, fill #0
  Filled 2022-07-18: qty 180, 90d supply, fill #1

## 2022-05-01 NOTE — Telephone Encounter (Signed)
Advanced Heart Failure Patient Advocate Encounter  Prior Authorizations for Marcelline Deist and Sherryll Burger have been approved through [facility].    Karle Plumber, PharmD, BCPS, BCCP, CPP Heart Failure Clinic Pharmacist [phone]

## 2022-05-01 NOTE — Patient Instructions (Signed)
Please restart all your medications.  Labs done today, your results will be available in MyChart, we will contact you for abnormal readings.  Your physician has requested that you have a cardiac MRI. Cardiac MRI uses a computer to create images of your heart as its beating, producing both still and moving pictures of your heart and major blood vessels. For further information please visit [email or web address]. Please follow the instruction sheet given to you today for more information. ONCE APPROVED BY INSURANCE YOU WILL BW CALLED TO ARRANGE THE TEST.  You have been referred to the paramedicine. Our social worker Eileen Stanford will be in contact with you.  Your physician recommends that you schedule a follow-up appointment in: 3 weeks  If you have any questions or concerns before your next appointment please send [place] a message through [place] or call our office at [phone].    TO LEAVE A MESSAGE FOR THE NURSE SELECT OPTION 2, PLEASE LEAVE A MESSAGE INCLUDING: YOUR NAME DATE OF BIRTH CALL BACK NUMBER REASON FOR CALL**this is important as we prioritize the call backs  YOU WILL RECEIVE A CALL BACK THE SAME DAY AS LONG AS YOU CALL BEFORE 4:00 PM  At the Advanced Heart Failure Clinic, you and your health needs are our priority. As part of our continuing mission to provide you with exceptional heart care, we have created designated Provider Care Teams. These Care Teams include your primary Cardiologist (physician) and Advanced Practice Providers (APPs- Physician Assistants and Nurse Practitioners) who all work together to provide you with the care you need, when you need it.   You may see any of the following providers on your designated Care Team at your next follow up: Dr Arvilla Meres Dr Marca Ancona Dr. Marcos Eke, NP Robbie Lis, [place] [facility] [place], [place] Brynda Peon, NP Karle Plumber, PharmD   Please be sure to bring in all your medications bottles to  every appointment.

## 2022-05-01 NOTE — Progress Notes (Signed)
  Echocardiogram 2D Echocardiogram has been performed.  Delcie Roch 05/01/2022, 8:53 AM

## 2022-05-02 ENCOUNTER — Other Ambulatory Visit (HOSPITAL_COMMUNITY): Payer: Self-pay

## 2022-05-02 NOTE — Progress Notes (Signed)
ADVANCED HF CLINIC CONSULT NOTE  Primary Care: [name], NP HF Cardiologist: Dr. 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  Admitted 5/23 with new acute heart failure. Given IV lasix. Echo showed EF 15-20% range with mildly dilated LV, moderately dilated RV with severely decreased systolic function, moderate-severe functional mitral regurgitation, dilated IVC. Underwent R/LHC showing mildly elevated filling pressures, low CO and 70% proximal LAD stenosis, 90% distal LAD stenosis,  80% stenosis proximally in a moderate D1. Films reviewed with Dr. Martin[place] and decided not to intervene in the absence of chest pain and presence of diffuse cardiomyopathy, out of proportion to known disease. Started on milrinone and continued with diuresis. Drips weaned off and GDMT titrated. Started on mexiletine with frequent PVCs. Discharged home, weight 201 lbs.  Echo was done today and reviewed, EF 30-35%, mild LV dilation, moderate RV dysfunction with moderate MR, IVC normal.   He returns today for followup of CHF.  He has been off all his meds x 2 wks, ran out.  BP mildly elevated.  He says he can tell that he's off his meds, but does not feel particularly bad.  He still feels much better than prior to 5/23 hospitalization.  No significant exertional dyspnea.  No chest pain.  No lightheadedness.  No orthopnea/PND.   ECG (personally reviewed): NSR, anterolateral TWIs  Labs (5/23): K 4.4, creatinine 1.82, hgb 12.5 Labs (7/23): K 4.1, creatinine 1.56  PMH: 1. 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   - Echo (5/23): EF 15-20%, moderately dilated RV with severely decreased systolic function, moderate-severe functional mitral regurgitation.  - RHC/LHC (5/23): Mean RA 11, PA 40/21, mean PCWP 23, CI 1.89; 70% proximal LAD stenosis, 90% distal LAD stenosis,  80% stenosis  proximally in a moderate D1.  - Echo (8/23): EF 30-35%, mild LV dilation, moderate RV dysfunction with moderate MR, IVC normal.  2. CAD: LHC (5/23) with 70% proximal LAD stenosis, 90% distal LAD stenosis,  80% stenosis proximally in a moderate D1. Managed medically.  3. HTN 4. DM2 5. 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 Gout 7. Hypothyroidism  Review of Systems: All systems reviewed and negative except as per HPI.   Current Outpatient Medications  Medication Sig Dispense Refill   Blood Glucose Monitoring Suppl (TRUE METRIX METER) W/DEVICE KIT 1 each by Does not apply route 3 (three) times daily. 1 kit 0   glucose blood test strip Use as instructed 100 each 12   levothyroxine (SYNTHROID, LEVOTHROID) 75 MCG tablet Take 1 tablet (75 mcg total) by mouth daily. 30 tablet 2   metFORMIN (GLUCOPHAGE) 1000 MG tablet Take 1,000 mg by mouth 2 (two) times daily with a meal.     aspirin EC 81 MG tablet Take 1 tablet (81 mg total) by mouth daily. Swallow whole. 30 tablet    carvedilol (COREG) 3.125 MG tablet Take 1 tablet (3.125 mg total) by mouth 2 (two) times daily. 180 tablet 3   dapagliflozin propanediol (FARXIGA) 10 MG TABS tablet Take 1 tablet (10 mg total) by mouth daily. 90 tablet 3   digoxin (LANOXIN) 0.125 MG tablet Take 1 tablet (0.125 mg total) by mouth daily. 90 tablet 3   hydrALAZINE (APRESOLINE) 50 MG tablet Take 1 tablet (50 mg total) by mouth 3 (three) times daily. 270 tablet 3   isosorbide mononitrate (IMDUR) 30 MG 24 hr tablet Take 1  tablet (30 mg total) by mouth daily. 90 tablet 3   mexiletine (MEXITIL) 150 MG capsule Take 1 capsule (150 mg total) by mouth every 12 (twelve) hours. 180 capsule 3   rosuvastatin (CRESTOR) 20 MG tablet Take 1 tablet (20 mg total) by mouth daily. 90 tablet 3   sacubitril-valsartan (ENTRESTO) 49-51 MG Take 1 tablet by mouth 2 (two) times daily. 180 tablet 3   spironolactone (ALDACTONE) 25 MG tablet Take 1 tablet (25 mg total) by mouth daily. 90  tablet 3   No current facility-administered medications for this encounter.   No Known Allergies  Social History   Socioeconomic History   Marital status: Single    Spouse name: Not on file   Number of children: Not on file   Years of education: Not on file   Highest education level: Not on file  Occupational History   Not on file  Tobacco Use   Smoking status: Never   Smokeless tobacco: Never  Substance and Sexual Activity   Alcohol use: No   Drug use: No   Sexual activity: Yes  Other Topics Concern   Not on file  Social History Narrative   Not on file   Social Determinants of Health   Financial Resource Strain: Not on file  Food Insecurity: Not on file  Transportation Needs: Not on file  Physical Activity: Not on file  Stress: Not on file  Social Connections: Not on file  Intimate Partner Violence: Not on file   Family History  Problem Relation Age of Onset   Heart attack Father    Diabetes Mellitus II Neg Hx    CAD Neg Hx    BP (!) 148/88   Pulse 64   Wt 94.4 kg (208 lb 3.2 oz)   SpO2 97%   BMI 28.24 kg/m   Wt Readings from Last 3 Encounters:  05/01/22 94.4 kg (208 lb 3.2 oz)  03/07/22 94.2 kg (207 lb 9.6 oz)  01/18/22 92.1 kg (203 lb)   PHYSICAL EXAM: General: NAD Neck: No JVD, no thyromegaly or thyroid nodule.  Lungs: Clear to auscultation bilaterally with normal respiratory effort. CV: Nondisplaced PMI.  Heart regular S1/S2, no S3/S4, no murmur.  No peripheral edema.  No carotid bruit.  Normal pedal pulses.  Abdomen: Soft, nontender, no hepatosplenomegaly, no distention.  Skin: Intact without lesions or rashes.  Neurologic: Alert and oriented x 3.  Psych: Normal affect. Extremities: No clubbing or cyanosis.  HEENT: Normal.  ASSESSMENT & PLAN: 1. Chronic systolic CHF: Echo in 7/98 with EF 15-20% range with mildly dilated LV, moderately dilated RV with severely decreased systolic function, moderate-severe functional mitral regurgitation,  dilated IVC.  Does not have history of ETOH or drugs.  No strong FH of cardiomyopathy.  He was noted in 5/23 hospitalization to have frequent PVCs, may contribute to CMP.  RHC in 5/23 showed elevated filling pressures and low cardiac output (CI 1.89)>>started on Milrinone. LHC showed moderate LAD disease, however cardiomyopathy was out of proportion to degree of CAD. 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 Echo today showed some improvement, EF up to 30-35%.  He has actually been out of his meds x 2 wks.  - BMET/BNP today.  - He does not appear to need Lasix.  - Restart Entresto 49/51 bid.  - Restart dapagliflozin 10 daily.   - Restart hydralazine 50 mg tid + Imdur 30 mg daily. - Restart spironolactone 25 daily.  - Restart digoxin 0.125 mg daily.  - Arrange cMRI to look  for infiltrative disease.  - Narrow QRS, would not be CRT candidate. Would not place ICD yet as EF seems to have improved some.  Needs to restart his meds and repeat imaging (will get cMRI as above).    2. CAD: Mod LAD and Diag disease on 5/23 cath. The proximal LAD lesion is not critical but looks to be around 70% (moderate stenosis).  This could potentially be intervened upon, as could the 1st diagonal (would not intervene on the severe far distal LAD stenosis).  However, patient reports no chest pain and his cardiomyopathy is diffuse and not explained by the moderate LAD disease. Would manage medically for now unless he develops chest pain.  - Restart ASA + statin.   3. CKD stage 3: suspect baseline diabetic nephropathy. BMET today. - Restart Wilder Glade.  4. Diabetes: Type 2.  5. HTN: Restart meds.  6. Mitral regurgitation: Mod-severe functional MR on 5/23 echo, echo today showed moderate MR.  Continue to follow.  7. PVCs: Frequent during 5/23 admission. Asymptomatic.  - Restart mexiletine 150 mg bid.  - Once he is back on his meds, would assess via Zio monitor.    Arrange for paramedicine to help with medication  compliance.  Followup in 3 wks with APP, make sure to restart all meds.   Loralie Champagne 05/02/2022

## 2022-05-03 ENCOUNTER — Telehealth (HOSPITAL_COMMUNITY): Payer: Self-pay | Admitting: Licensed Clinical Social Worker

## 2022-05-03 NOTE — Telephone Encounter (Signed)
CSW received referral to place pt on paramedicine program.  Unable to reach patient- phone not accepting calls- and unable to leave VM.  Will attempt to contact at later date.  Burna Sis, LCSW Clinical Social Worker [facility] Desk#: [phone] Cell#: [phone]

## 2022-05-09 ENCOUNTER — Telehealth (HOSPITAL_COMMUNITY): Payer: Self-pay | Admitting: Licensed Clinical Social Worker

## 2022-05-09 NOTE — Telephone Encounter (Signed)
Attempted to call pt to discuss referral to [employer].  Unable to reach or leave a VM.  Will continue to attempt to contact  Burna Sis, LCSW Clinical Social Worker [facility] Desk#: [phone] Cell#: [phone]

## 2022-05-13 ENCOUNTER — Other Ambulatory Visit (HOSPITAL_COMMUNITY): Payer: Self-pay

## 2022-05-21 ENCOUNTER — Telehealth (HOSPITAL_COMMUNITY): Payer: Self-pay

## 2022-05-21 NOTE — Telephone Encounter (Signed)
Called and was not able to leave a voice message to confirm/remind patient of their appointment at the [facility] on 05/22/22.

## 2022-05-22 ENCOUNTER — Encounter (HOSPITAL_COMMUNITY): Payer: Self-pay

## 2022-05-22 ENCOUNTER — Telehealth (HOSPITAL_COMMUNITY): Payer: Self-pay | Admitting: Emergency Medicine

## 2022-05-22 ENCOUNTER — Other Ambulatory Visit: Payer: Self-pay

## 2022-05-22 ENCOUNTER — Ambulatory Visit (HOSPITAL_COMMUNITY)
Admission: RE | Admit: 2022-05-22 | Discharge: 2022-05-22 | Disposition: A | Discharge status: Home / Self Care | Payer: MEDICAID | Source: Ambulatory Visit | Attending: Cardiology | Admitting: Cardiology

## 2022-05-22 ENCOUNTER — Ambulatory Visit (HOSPITAL_COMMUNITY)
Admission: RE | Admit: 2022-05-22 | Discharge: 2022-05-22 | Disposition: A | Discharge status: Home / Self Care | Payer: Medicaid Other | Source: Ambulatory Visit | Attending: Family Medicine | Admitting: Family Medicine

## 2022-05-22 VITALS — BP 160/80 | HR 68 | Wt 205.4 lb

## 2022-05-22 DIAGNOSIS — I5022 Chronic systolic (congestive) heart failure: Secondary | ICD-10-CM | POA: Diagnosis not present

## 2022-05-22 DIAGNOSIS — I340 Nonrheumatic mitral (valve) insufficiency: Secondary | ICD-10-CM

## 2022-05-22 DIAGNOSIS — I493 Ventricular premature depolarization: Secondary | ICD-10-CM

## 2022-05-22 DIAGNOSIS — I429 Cardiomyopathy, unspecified: Secondary | ICD-10-CM | POA: Diagnosis not present

## 2022-05-22 DIAGNOSIS — I10 Essential (primary) hypertension: Secondary | ICD-10-CM

## 2022-05-22 DIAGNOSIS — E1122 Type 2 diabetes mellitus with diabetic chronic kidney disease: Secondary | ICD-10-CM | POA: Diagnosis not present

## 2022-05-22 DIAGNOSIS — N183 Chronic kidney disease, stage 3 unspecified: Secondary | ICD-10-CM

## 2022-05-22 DIAGNOSIS — N189 Chronic kidney disease, unspecified: Secondary | ICD-10-CM | POA: Diagnosis not present

## 2022-05-22 DIAGNOSIS — I2510 Atherosclerotic heart disease of native coronary artery without angina pectoris: Secondary | ICD-10-CM | POA: Diagnosis not present

## 2022-05-22 LAB — CBC
HCT: 38.9 % — ABNORMAL LOW (ref 39.0–52.0)
Hemoglobin: 13.2 g/dL (ref 13.0–17.0)
MCH: 31 pg (ref 26.0–34.0)
MCHC: 33.9 g/dL (ref 30.0–36.0)
MCV: 91.3 fL (ref 80.0–100.0)
Platelets: 139 10*3/uL — ABNORMAL LOW (ref 150–400)
RBC: 4.26 MIL/uL (ref 4.22–5.81)
RDW: 13.5 % (ref 11.5–15.5)
WBC: 4.9 10*3/uL (ref 4.0–10.5)
nRBC: 0 % (ref 0.0–0.2)

## 2022-05-22 LAB — BASIC METABOLIC PANEL
Anion gap: 7 (ref 5–15)
BUN: 16 mg/dL (ref 8–23)
CO2: 23 mmol/L (ref 22–32)
Calcium: 9 mg/dL (ref 8.9–10.3)
Chloride: 108 mmol/L (ref 98–111)
Creatinine, Ser: 1.51 mg/dL — ABNORMAL HIGH (ref 0.61–1.24)
GFR, Estimated: 52 mL/min — ABNORMAL LOW (ref >60)
Glucose, Bld: 173 mg/dL — ABNORMAL HIGH (ref 70–99)
Potassium: 3.7 mmol/L (ref 3.5–5.1)
Sodium: 138 mmol/L (ref 135–145)

## 2022-05-22 LAB — DIGOXIN LEVEL: Digoxin Level: 0.6 ng/mL — ABNORMAL LOW (ref 0.8–2.0)

## 2022-05-22 LAB — BRAIN NATRIURETIC PEPTIDE: B Natriuretic Peptide: 86.7 pg/mL (ref 0.0–100.0)

## 2022-05-22 MED ORDER — ENTRESTO 97-103 MG PO TABS
1.0000 | ORAL_TABLET | Freq: Two times a day (BID) | ORAL | 11 refills | Status: DC
  Filled 2022-05-22 – 2022-05-27 (×2): qty 60, 30d supply, fill #0
  Filled 2022-07-18: qty 60, 30d supply, fill #1
  Filled 2022-08-19: qty 60, 30d supply, fill #2

## 2022-05-22 NOTE — Patient Instructions (Addendum)
INCREASE Entresto to 97/103 mg one tab twice a day  Labs today We will only contact you if something comes back abnormal or we need to make some changes. Otherwise no news is good news!  Labs needed in 10-14 days  Your provider has recommended that  you wear a Zio Patch for 14 days.  This monitor will record your heart rhythm for our review.  IF you have any symptoms while wearing the monitor please press the button.  If you have any issues with the patch or you notice a red or orange light on it please call the company at [phone].  Once you remove the patch please mail it back to the company as soon as possible so we can get the results.  Your physician has requested that you have a cardiac MRI. Cardiac MRI uses a computer to create images of your heart as its beating, producing both still and moving pictures of your heart and major blood vessels. For further information please visit [email or web address] Please follow the instruction sheet given to you today for more information. -Appointment 07/19/2022 @ [place] recommends that you schedule a follow-up appointment in: 8 weeks  in the Advanced Practitioners (PA/NP) Clinic and in 3-4 months with Dr Aundra Dubin   Do the following things EVERYDAY: Weigh yourself in the morning before breakfast. Write it down and keep it in a log. Take your medicines as prescribed Eat low salt foods--Limit salt (sodium) to 2000 mg per day.  Stay as active as you can everyday Limit all fluids for the day to less than 2 liters  At the [facility], you and your health needs are our priority. As part of our continuing mission to provide you with exceptional heart care, we have created designated Provider Care Teams. These Care Teams include your primary Cardiologist (physician) and Advanced Practice Providers (APPs- Physician Assistants and Nurse Practitioners) who all work together to provide you with the care you need, when you need  it.   You may see any of the following providers on your designated Care Team at your next follow up: Dr Glori Bickers Dr Loralie Champagne Dr. Roxana Hires, NP Lyda Jester, [place] [facility] [place], [place] Forestine Na, NP Audry Riles, PharmD   Please be sure to bring in all your medications bottles to every appointment.

## 2022-05-22 NOTE — Progress Notes (Signed)
ADVANCED HF CLINIC CONSULT NOTE  Primary Care: [name], NP HF Cardiologist: Dr. 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  Admitted 5/23 with new acute heart failure. Given IV lasix. Echo showed EF 15-20% range with mildly dilated LV, moderately dilated RV with severely decreased systolic function, moderate-severe functional mitral regurgitation, dilated IVC. Underwent R/LHC showing mildly elevated filling pressures, low CO and 70% proximal LAD stenosis, 90% distal LAD stenosis,  80% stenosis proximally in a moderate D1. Films reviewed with Dr. [place] and decided not to intervene in the absence of chest pain and presence of diffuse cardiomyopathy, out of proportion to known disease. Started on milrinone and continued with diuresis. Drips weaned off and GDMT titrated. Started on mexiletine with frequent PVCs. Discharged home, weight 201 lbs.  Echo 8/23 showed EF 30-35%, mild LV dilation, moderate RV dysfunction with moderate MR, IVC normal.   Today he returns for HF follow up. Overall feeling fine. He is not SOB walking up stairs or carrying groceries. Denies palpitations, CP, dizziness, edema, or PND/Orthopnea. Appetite ok. No fever or chills. Weight at home 205 pounds. Taking all medications consistently. He has not needed Lasix.  ECG (personally reviewed): none ordered today.  Labs (5/23): K 4.4, creatinine 1.82, hgb 12.5 Labs (7/23): K 4.1, creatinine 1.56 Labs (8/23): K 4.1, creatinine 1.28  PMH: 1. 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   - Echo (5/23): EF 15-20%, moderately dilated RV with severely decreased systolic function, moderate-severe functional mitral regurgitation.  - RHC/LHC (5/23): Mean RA 11, PA 40/21, mean PCWP 23, CI 1.89; 70% proximal LAD stenosis, 90% distal LAD stenosis,  80% stenosis proximally in a moderate D1.  - Echo  (8/23): EF 30-35%, mild LV dilation, moderate RV dysfunction with moderate MR, IVC normal.  2. CAD: LHC (5/23) with 70% proximal LAD stenosis, 90% distal LAD stenosis,  80% stenosis proximally in a moderate D1. Managed medically.  3. HTN 4. DM2 5. 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 Gout 7. Hypothyroidism  Review of Systems: All systems reviewed and negative except as per HPI.   Current Outpatient Medications  Medication Sig Dispense Refill   aspirin EC 81 MG tablet Take 1 tablet (81 mg total) by mouth daily. Swallow whole. 30 tablet    Blood Glucose Monitoring Suppl (TRUE METRIX METER) W/DEVICE KIT 1 each by Does not apply route 3 (three) times daily. 1 kit 0   carvedilol (COREG) 3.125 MG tablet Take 1 tablet (3.125 mg total) by mouth 2 (two) times daily. 180 tablet 3   dapagliflozin propanediol (FARXIGA) 10 MG TABS tablet Take 1 tablet (10 mg total) by mouth daily. 90 tablet 3   digoxin (LANOXIN) 0.125 MG tablet Take 1 tablet (0.125 mg total) by mouth daily. 90 tablet 3   glucose blood test strip Use as instructed 100 each 12   hydrALAZINE (APRESOLINE) 50 MG tablet Take 1 tablet (50 mg total) by mouth 3 (three) times daily. 270 tablet 3   isosorbide mononitrate (IMDUR) 30 MG 24 hr tablet Take 1 tablet (30 mg total) by mouth daily. 90 tablet 3   levothyroxine (SYNTHROID, LEVOTHROID) 75 MCG tablet Take 1 tablet (75 mcg total) by mouth daily. 30 tablet 2   metFORMIN (GLUCOPHAGE) 1000 MG tablet Take 1,000 mg by mouth 2 (two) times daily with a meal.     mexiletine (MEXITIL) 150 MG capsule Take  1 capsule (150 mg total) by mouth every 12 (twelve) hours. 180 capsule 3   rosuvastatin (CRESTOR) 20 MG tablet Take 1 tablet (20 mg total) by mouth daily. 90 tablet 3   sacubitril-valsartan (ENTRESTO) 49-51 MG Take 1 tablet by mouth 2 (two) times daily. 180 tablet 3   spironolactone (ALDACTONE) 25 MG tablet Take 1 tablet (25 mg total) by mouth daily. 90 tablet 3   No current  facility-administered medications for this encounter.   No Known Allergies  Social History   Socioeconomic History   Marital status: Single    Spouse name: Not on file   Number of children: Not on file   Years of education: Not on file   Highest education level: Not on file  Occupational History   Not on file  Tobacco Use   Smoking status: Never   Smokeless tobacco: Never  Substance and Sexual Activity   Alcohol use: No   Drug use: No   Sexual activity: Yes  Other Topics Concern   Not on file  Social History Narrative   Not on file   Social Determinants of Health   Financial Resource Strain: Not on file  Food Insecurity: Not on file  Transportation Needs: Not on file  Physical Activity: Not on file  Stress: Not on file  Social Connections: Not on file  Intimate Partner Violence: Not on file   Family History  Problem Relation Age of Onset   Heart attack Father    Diabetes Mellitus II Neg Hx    CAD Neg Hx    BP (!) 160/80   Pulse 68   Wt 93.2 kg (205 lb 6.4 oz)   SpO2 97%   BMI 27.86 kg/m   Wt Readings from Last 3 Encounters:  05/22/22 93.2 kg (205 lb 6.4 oz)  05/01/22 94.4 kg (208 lb 3.2 oz)  03/07/22 94.2 kg (207 lb 9.6 oz)   PHYSICAL EXAM: General:  NAD. No resp difficulty HEENT: Normal Neck: Supple. No JVD. Carotids 2+ bilat; no bruits. No lymphadenopathy or thryomegaly appreciated. Cor: PMI nondisplaced. Regular rate & rhythm. No rubs, gallops or murmurs. Lungs: Clear Abdomen: Soft, nontender, nondistended. No hepatosplenomegaly. No bruits or masses. Good bowel sounds. Extremities: No cyanosis, clubbing, rash, edema Neuro: Alert & oriented x 3, cranial nerves grossly intact. Moves all 4 extremities w/o difficulty. Affect pleasant.  ASSESSMENT & PLAN: 1. Chronic systolic CHF: Echo in 1/74 with EF 15-20% range with mildly dilated LV, moderately dilated RV with severely decreased systolic function, moderate-severe functional mitral regurgitation,  dilated IVC.  Does not have history of ETOH or drugs.  No strong FH of cardiomyopathy.  He was noted in 5/23 hospitalization to have frequent PVCs, may contribute to CMP.  RHC in 5/23 showed elevated filling pressures and low cardiac output (CI 1.89)>>started on Milrinone. LHC showed moderate LAD disease, however cardiomyopathy was out of proportion to degree of CAD. 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 Echo 8/23 showed some improvement, EF up to 30-35%, despite being out of his meds x 2 weeks.  Today, he is NYHA II and not volume overloaded on exam.  - Increase Entresto to 97/103 mg bid. BMET/BNP today and repeat BMET in 10 days. - He does not need Lasix.  - Continue carvedilol 3.125 mg bid. - Continue dapagliflozin 10 daily.   - Continue hydralazine 50 mg tid + Imdur 30 mg daily. - Continue spironolactone 25 mg daily.  - Continue digoxin 0.125 mg daily. Check level today. - cMRI to look  for infiltrative disease is scheduled on 07/19/22.  - Narrow QRS, would not be CRT candidate. Would not place ICD yet as EF seems to have improved some.    2. CAD: Mod LAD and Diag disease on 5/23 cath. The proximal LAD lesion is not critical but looks to be around 70% (moderate stenosis).  This could potentially be intervened upon, as could the 1st diagonal (would not intervene on the severe far distal LAD stenosis).  However, patient reports no chest pain and his cardiomyopathy is diffuse and not explained by the moderate LAD disease. Would manage medically for now unless he develops chest pain.  - Continue ASA + statin.   3. CKD stage 3: suspect baseline diabetic nephropathy. BMET today. - Continue Farxiga.  4. Diabetes: Type 2. He is not on insulin. 5. HTN: Elevated today.  - Increase Entresto as above. 6. Mitral regurgitation: Mod-severe functional MR on 5/23 echo, echo 8/23 showed moderate MR.  Continue to follow.  7. PVCs: Frequent during 5/23 admission. Asymptomatic.  - Continue mexiletine 150 mg  bid.  - Repeat Zio monitor 14 day to assess PVC burden now that he is back on mexiletine.    Follow up with APP after cMRI, and in 3 months with Dr. Aundra Dubin + echo.  Maricela Bo [facility] FNP-BC 05/22/2022

## 2022-05-22 NOTE — Telephone Encounter (Signed)
Created in error

## 2022-05-23 ENCOUNTER — Encounter (HOSPITAL_COMMUNITY): Payer: Self-pay

## 2022-05-24 ENCOUNTER — Telehealth (HOSPITAL_COMMUNITY): Payer: Self-pay | Admitting: Emergency Medicine

## 2022-05-24 NOTE — Telephone Encounter (Signed)
Contacted Mr. Eric Larson today.  Scheduled initial home visit for Monday 9/25 at 2:00.  He agreed to same.

## 2022-05-27 ENCOUNTER — Other Ambulatory Visit (HOSPITAL_COMMUNITY): Payer: Self-pay

## 2022-05-27 ENCOUNTER — Other Ambulatory Visit (HOSPITAL_COMMUNITY): Payer: Self-pay | Admitting: Emergency Medicine

## 2022-05-27 ENCOUNTER — Telehealth (HOSPITAL_COMMUNITY): Payer: Self-pay | Admitting: Emergency Medicine

## 2022-05-27 ENCOUNTER — Other Ambulatory Visit: Payer: Self-pay

## 2022-05-27 NOTE — Telephone Encounter (Signed)
Text Mr. Paolillo to remind him of his home visit appointment today @ 2:00.

## 2022-05-27 NOTE — Progress Notes (Unsigned)
Paramedicine Encounter    Patient ID: Eric Larson, male    DOB: 1961/06/16, 61 y.o.   MRN: [id number]   BP (!) 160/80 (BP Location: Right Arm, Patient Position: Sitting, Cuff Size: Normal)   Pulse 68   Resp 16   Wt 205 lb (93 kg)   SpO2 95%   BMI 27.80 kg/m  Weight yesterday-not taken Last visit weight-not taken Cbg 184  Initial home visit with Eric Larson today.  Found him to be A&O x 4, skin W&D w/ good color. Pt is living with the mother of his children.  She is currently setting up his meds in a pill box.  She keeps his meds locked in a safe so I did not have access to them during this visit.  Med box was reviewed and found to be in order.  For some reason, pt has his meds at 3 [facility] pharmacies ([facility], [facility], [facility])  He desires to get them all in one location and chooses [facility].   Today pt denies chest pain or SOB.  Lung sounds clear and equal bilat.  No edema noted.   Discussed w/ patient the importance of medication compliance.  Also discussed nutritional choices and attention to sodium content on food labels.  Explained the purpose of the Paramedicine program to Eric Larson.  Advised the ultimate goal is to help monitor him, make sure he's doing all the right things and help keep him out of the hospital.   Pt was hypertensive today.  He had a recent increase in his Entresto to 97-103 but has not picked up that prescription yet.  He advised he would pick it up today. I will follow up with him tomorrow to make sure.  Home visit complete.    [name], [place] 05/28/2022   Patient Care Team: [name], NP as PCP - General [name], MD as PCP - Advanced Heart Failure (Cardiology)  Patient Active Problem List   Diagnosis Date Noted   Chronic systolic heart failure ([place]) 01/18/2022   Acute heart failure ([place]) 01/07/2022   Essential hypertension 01/07/2022   Diabetes mellitus ([place]) 10/22/2013    Hypothyroidism 10/08/2013   AKI (acute kidney injury) ([place]) 10/07/2013   Hyperglycemia without ketosis 10/06/2013   DKA (diabetic ketoacidoses) 10/06/2013   Elevated blood pressure 10/06/2013   History of gout 10/06/2013   History of goiter 10/06/2013   Type 2 diabetes mellitus with hyperosmolar nonketotic hyperglycemia ([place]) 10/06/2013   CKD (chronic kidney disease) stage 3, GFR 30-59 ml/min (HCC) 10/06/2013   Obesity 10/06/2013   GERD (gastroesophageal reflux disease) 10/06/2013    Current Outpatient Medications:    aspirin EC 81 MG tablet, Take 1 tablet (81 mg total) by mouth daily. Swallow whole., Disp: 30 tablet, Rfl:    Blood Glucose Monitoring Suppl (TRUE METRIX METER) W/DEVICE KIT, 1 each by Does not apply route 3 (three) times daily., Disp: 1 kit, Rfl: 0   carvedilol (COREG) 3.125 MG tablet, Take 1 tablet (3.125 mg total) by mouth 2 (two) times daily., Disp: 180 tablet, Rfl: 3   digoxin (LANOXIN) 0.125 MG tablet, Take 1 tablet (0.125 mg total) by mouth daily., Disp: 90 tablet, Rfl: 3   glucose blood test strip, Use as instructed, Disp: 100 each, Rfl: 12   hydrALAZINE (APRESOLINE) 50 MG tablet, Take 1 tablet (50 mg total) by mouth 3 (three) times daily., Disp: 270 tablet, Rfl: 3   isosorbide mononitrate (IMDUR) 30 MG  24 hr tablet, Take 1 tablet (30 mg total) by mouth daily., Disp: 90 tablet, Rfl: 3   levothyroxine (SYNTHROID, LEVOTHROID) 75 MCG tablet, Take 1 tablet (75 mcg total) by mouth daily., Disp: 30 tablet, Rfl: 2   metFORMIN (GLUCOPHAGE) 1000 MG tablet, Take 1,000 mg by mouth 2 (two) times daily with a meal., Disp: , Rfl:    mexiletine (MEXITIL) 150 MG capsule, Take 1 capsule (150 mg total) by mouth every 12 (twelve) hours., Disp: 180 capsule, Rfl: 3   rosuvastatin (CRESTOR) 20 MG tablet, Take 1 tablet (20 mg total) by mouth daily., Disp: 90 tablet, Rfl: 3   sacubitril-valsartan (ENTRESTO) 97-103 MG, Take 1 tablet by mouth 2 (two) times daily., Disp: 60 tablet, Rfl: 11    dapagliflozin propanediol (FARXIGA) 10 MG TABS tablet, Take 1 tablet (10 mg total) by mouth daily., Disp: 90 tablet, Rfl: 3   spironolactone (ALDACTONE) 25 MG tablet, Take 1 tablet (25 mg total) by mouth daily., Disp: 90 tablet, Rfl: 3 No Known Allergies    Social History   Socioeconomic History   Marital status: Single    Spouse name: Not on file   Number of children: Not on file   Years of education: Not on file   Highest education level: Not on file  Occupational History   Not on file  Tobacco Use   Smoking status: Never   Smokeless tobacco: Never  Substance and Sexual Activity   Alcohol use: No   Drug use: No   Sexual activity: Yes  Other Topics Concern   Not on file  Social History Narrative   Not on file   Social Determinants of Health   Financial Resource Strain: Not on file  Food Insecurity: Not on file  Transportation Needs: Not on file  Physical Activity: Not on file  Stress: Not on file  Social Connections: Not on file  Intimate Partner Violence: Not on file    Physical Exam      Future Appointments  Date Time Provider [place]  06/04/2022  9:00 AM [facility] LAB [facility] None  07/05/2022  1:00 PM Camillia Herter, NP PCE-PCE None  07/19/2022  8:00 AM MC-MR 1 [facility]MRI [facility]  07/19/2022 10:30 AM [facility] PA/NP [facility] None  08/21/2022  9:00 AM [name], MD [place] None       [name], [place] [facility] Paramedic  05/27/22

## 2022-05-29 ENCOUNTER — Other Ambulatory Visit: Payer: Self-pay

## 2022-05-29 ENCOUNTER — Other Ambulatory Visit (HOSPITAL_COMMUNITY): Payer: Self-pay

## 2022-06-03 ENCOUNTER — Other Ambulatory Visit: Payer: Self-pay | Admitting: Physician Assistant

## 2022-06-04 ENCOUNTER — Ambulatory Visit (HOSPITAL_COMMUNITY)
Admission: RE | Admit: 2022-06-04 | Discharge: 2022-06-04 | Disposition: A | Discharge status: Home / Self Care | Payer: Medicaid Other | Source: Ambulatory Visit | Attending: Cardiology | Admitting: Cardiology

## 2022-06-04 DIAGNOSIS — I5022 Chronic systolic (congestive) heart failure: Secondary | ICD-10-CM | POA: Insufficient documentation

## 2022-06-04 LAB — BASIC METABOLIC PANEL
Anion gap: 10 (ref 5–15)
BUN: 20 mg/dL (ref 8–23)
CO2: 21 mmol/L — ABNORMAL LOW (ref 22–32)
Calcium: 9.2 mg/dL (ref 8.9–10.3)
Chloride: 106 mmol/L (ref 98–111)
Creatinine, Ser: 1.59 mg/dL — ABNORMAL HIGH (ref 0.61–1.24)
GFR, Estimated: 49 mL/min — ABNORMAL LOW (ref >60)
Glucose, Bld: 165 mg/dL — ABNORMAL HIGH (ref 70–99)
Potassium: 4.2 mmol/L (ref 3.5–5.1)
Sodium: 137 mmol/L (ref 135–145)

## 2022-06-06 ENCOUNTER — Other Ambulatory Visit (HOSPITAL_COMMUNITY): Payer: Self-pay

## 2022-06-06 ENCOUNTER — Other Ambulatory Visit (HOSPITAL_COMMUNITY): Payer: Self-pay | Admitting: Emergency Medicine

## 2022-06-06 ENCOUNTER — Telehealth (HOSPITAL_COMMUNITY): Payer: Self-pay | Admitting: Emergency Medicine

## 2022-06-06 ENCOUNTER — Other Ambulatory Visit (HOSPITAL_COMMUNITY): Payer: Self-pay | Admitting: Cardiology

## 2022-06-06 NOTE — Progress Notes (Signed)
Paramedicine Encounter    Patient ID: Eric Larson, male    DOB: [birth year], 61 y.o.   MRN: [id number]   BP 120/70 (BP Location: Left Arm, Patient Position: Sitting, Cuff Size: Normal)   Pulse 83   Resp 14   Wt 204 lb (92.5 kg)   SpO2 96%   BMI 27.67 kg/m  Weight yesterday-not taken Last visit weight-205lb  ATF Eric Larson A&O x 4, skin W&D w/ good color.  Pt. Denies chest pain or SOB.  Lung sounds clear and equal bilat.  No edema noted.  Pt. Is doing very well in his med compliance.  We've gotten most all of his meds moved to [facility].  Refills needed for Mexilatine and Synthroid.  Pharmacy is reach out to doctors for approval.   Med box completed by [facility] and done accurately. Home visit complete.    Eric Larson, [place] 06/06/2022   Patient Care Team: Eric Shutter, NP as PCP - General Eric Dresser, MD as PCP - Advanced Heart Failure (Cardiology)  Patient Active Problem List   Diagnosis Date Noted   Chronic systolic heart failure ([place]) 01/18/2022   Acute heart failure ([place]) 01/07/2022   Essential hypertension 01/07/2022   Diabetes mellitus ([place]) 10/22/2013   Hypothyroidism 10/08/2013   AKI (acute kidney injury) ([place]) 10/07/2013   Hyperglycemia without ketosis 10/06/2013   DKA (diabetic ketoacidoses) 10/06/2013   Elevated blood pressure 10/06/2013   History of gout 10/06/2013   History of goiter 10/06/2013   Type 2 diabetes mellitus with hyperosmolar nonketotic hyperglycemia ([place]) 10/06/2013   CKD (chronic kidney disease) stage 3, GFR 30-59 ml/min (HCC) 10/06/2013   Obesity 10/06/2013   GERD (gastroesophageal reflux disease) 10/06/2013    Current Outpatient Medications:    aspirin EC 81 MG tablet, Take 1 tablet (81 mg total) by mouth daily. Swallow whole., Disp: 30 tablet, Rfl:    Blood Glucose Monitoring Suppl (TRUE METRIX METER) W/DEVICE KIT, 1 each by Does not apply route 3 (three) times daily., Disp: 1 kit, Rfl:  0   carvedilol (COREG) 3.125 MG tablet, Take 1 tablet (3.125 mg total) by mouth 2 (two) times daily., Disp: 180 tablet, Rfl: 3   dapagliflozin propanediol (FARXIGA) 10 MG TABS tablet, Take 1 tablet (10 mg total) by mouth daily., Disp: 90 tablet, Rfl: 3   digoxin (LANOXIN) 0.125 MG tablet, Take 1 tablet (0.125 mg total) by mouth daily., Disp: 90 tablet, Rfl: 3   glucose blood test strip, Use as instructed, Disp: 100 each, Rfl: 12   hydrALAZINE (APRESOLINE) 50 MG tablet, Take 1 tablet (50 mg total) by mouth 3 (three) times daily., Disp: 270 tablet, Rfl: 3   isosorbide mononitrate (IMDUR) 30 MG 24 hr tablet, Take 1 tablet (30 mg total) by mouth daily., Disp: 90 tablet, Rfl: 3   metFORMIN (GLUCOPHAGE) 1000 MG tablet, Take 1,000 mg by mouth 2 (two) times daily with a meal., Disp: , Rfl:    mexiletine (MEXITIL) 150 MG capsule, Take 1 capsule (150 mg total) by mouth every 12 (twelve) hours. NEED OV., Disp: 180 capsule, Rfl: 0   rosuvastatin (CRESTOR) 20 MG tablet, Take 1 tablet (20 mg total) by mouth daily., Disp: 90 tablet, Rfl: 3   sacubitril-valsartan (ENTRESTO) 97-103 MG, Take 1 tablet by mouth 2 (two) times daily., Disp: 60 tablet, Rfl: 11   spironolactone (ALDACTONE) 25 MG tablet, Take 1 tablet (25 mg total) by mouth daily., Disp: 90 tablet, Rfl: 3   levothyroxine (SYNTHROID, LEVOTHROID)  75 MCG tablet, Take 1 tablet (75 mcg total) by mouth daily. (Patient not taking: Reported on 06/06/2022), Disp: 30 tablet, Rfl: 2 No Known Allergies    Social History   Socioeconomic History   Marital status: Single    Spouse name: Not on file   Number of children: Not on file   Years of education: Not on file   Highest education level: Not on file  Occupational History   Not on file  Tobacco Use   Smoking status: Never   Smokeless tobacco: Never  Substance and Sexual Activity   Alcohol use: No   Drug use: No   Sexual activity: Yes  Other Topics Concern   Not on file  Social History Narrative   Not  on file   Social Determinants of Health   Financial Resource Strain: Not on file  Food Insecurity: Not on file  Transportation Needs: Not on file  Physical Activity: Not on file  Stress: Not on file  Social Connections: Not on file  Intimate Partner Violence: Not on file    Physical Exam      Future Appointments  Date Time Provider [place]  07/05/2022  1:00 PM Eric Herter, NP PCE-PCE None  07/18/2022 10:00 AM [facility] PA/NP MC-[facility] None  07/19/2022  8:00 AM MC-MR 1 MC-MRI [facility]  08/21/2022  9:00 AM Eric Dresser, MD [place] None       Eric Larson, [place] [facility] Paramedic  06/06/22

## 2022-06-06 NOTE — Telephone Encounter (Signed)
Called Mr. Redinger's brother Barbaraann Rondo to attempt to reach [place].  VM not set up couldn't leave message.

## 2022-06-13 ENCOUNTER — Other Ambulatory Visit (HOSPITAL_COMMUNITY): Payer: Self-pay

## 2022-06-13 ENCOUNTER — Other Ambulatory Visit (HOSPITAL_COMMUNITY): Payer: Self-pay | Admitting: Emergency Medicine

## 2022-06-13 MED FILL — Mexiletine HCl Cap 150 MG: ORAL | 90 days supply | Qty: 180 | Fill #0 | Status: CN

## 2022-06-13 NOTE — Progress Notes (Signed)
Paramedicine Encounter    Patient ID: Eric Larson, male    DOB: 04-15-1961, 61 y.o.   MRN: [id number]   BP 128/80 (BP Location: Right Arm, Patient Position: Sitting, Cuff Size: Normal)   Pulse 78   Resp 16   Wt 204 lb (92.5 kg)   SpO2 97%   BMI 27.67 kg/m  Weight yesterday-not taken Last visit weight-204lb Cbg 185  ATF [name] A&O x 4, skin W&D w good color.  Pt. Denies chdest pain or SOB.  Lung sounds clear and equal bilat.  He does have some edema to his ankles.  He was wearing short socks.  We discussed elevating his legs  and also we talked about wearing compression socks.  Pt's prescription was called in to [facility] [address].  Called to get it transferred to [facility].  He still needs refills for his Synthroid and request for same has been sent to doctor by pharmacy.  Otherwise he is compliant with all meds.  His friend Eustaquio Maize is filling his pill box and everything is in order.   Home visit complete.    [name], [place] 06/13/2022    Patient Care Team: [name], NP as PCP - General [name], MD as PCP - Advanced Heart Failure (Cardiology)  Patient Active Problem List   Diagnosis Date Noted   Chronic systolic heart failure ([place]) 01/18/2022   Acute heart failure ([place]) 01/07/2022   Essential hypertension 01/07/2022   Diabetes mellitus ([place]) 10/22/2013   Hypothyroidism 10/08/2013   AKI (acute kidney injury) ([place]) 10/07/2013   Hyperglycemia without ketosis 10/06/2013   DKA (diabetic ketoacidoses) 10/06/2013   Elevated blood pressure 10/06/2013   History of gout 10/06/2013   History of goiter 10/06/2013   Type 2 diabetes mellitus with hyperosmolar nonketotic hyperglycemia ([place]) 10/06/2013   CKD (chronic kidney disease) stage 3, GFR 30-59 ml/min (HCC) 10/06/2013   Obesity 10/06/2013   GERD (gastroesophageal reflux disease) 10/06/2013    Current Outpatient Medications:    aspirin EC 81 MG tablet, Take 1 tablet  (81 mg total) by mouth daily. Swallow whole., Disp: 30 tablet, Rfl:    Blood Glucose Monitoring Suppl (TRUE METRIX METER) W/DEVICE KIT, 1 each by Does not apply route 3 (three) times daily., Disp: 1 kit, Rfl: 0   carvedilol (COREG) 3.125 MG tablet, Take 1 tablet (3.125 mg total) by mouth 2 (two) times daily., Disp: 180 tablet, Rfl: 3   dapagliflozin propanediol (FARXIGA) 10 MG TABS tablet, Take 1 tablet (10 mg total) by mouth daily., Disp: 90 tablet, Rfl: 3   digoxin (LANOXIN) 0.125 MG tablet, Take 1 tablet (0.125 mg total) by mouth daily., Disp: 90 tablet, Rfl: 3   glucose blood test strip, Use as instructed, Disp: 100 each, Rfl: 12   hydrALAZINE (APRESOLINE) 50 MG tablet, Take 1 tablet (50 mg total) by mouth 3 (three) times daily., Disp: 270 tablet, Rfl: 3   isosorbide mononitrate (IMDUR) 30 MG 24 hr tablet, Take 1 tablet (30 mg total) by mouth daily., Disp: 90 tablet, Rfl: 3   metFORMIN (GLUCOPHAGE) 1000 MG tablet, Take 1,000 mg by mouth 2 (two) times daily with a meal., Disp: , Rfl:    mexiletine (MEXITIL) 150 MG capsule, Take 1 capsule (150 mg total) by mouth every 12 (twelve) hours. NEED OV., Disp: 180 capsule, Rfl: 0   rosuvastatin (CRESTOR) 20 MG tablet, Take 1 tablet (20 mg total) by mouth daily., Disp: 90 tablet, Rfl: 3   sacubitril-valsartan (ENTRESTO)  97-103 MG, Take 1 tablet by mouth 2 (two) times daily., Disp: 60 tablet, Rfl: 11   spironolactone (ALDACTONE) 25 MG tablet, Take 1 tablet (25 mg total) by mouth daily., Disp: 90 tablet, Rfl: 3   levothyroxine (SYNTHROID, LEVOTHROID) 75 MCG tablet, Take 1 tablet (75 mcg total) by mouth daily. (Patient not taking: Reported on 06/06/2022), Disp: 30 tablet, Rfl: 2 No Known Allergies    Social History   Socioeconomic History   Marital status: Single    Spouse name: Not on file   Number of children: Not on file   Years of education: Not on file   Highest education level: Not on file  Occupational History   Not on file  Tobacco Use    Smoking status: Never   Smokeless tobacco: Never  Substance and Sexual Activity   Alcohol use: No   Drug use: No   Sexual activity: Yes  Other Topics Concern   Not on file  Social History Narrative   Not on file   Social Determinants of Health   Financial Resource Strain: Not on file  Food Insecurity: Not on file  Transportation Needs: Not on file  Physical Activity: Not on file  Stress: Not on file  Social Connections: Not on file  Intimate Partner Violence: Not on file    Physical Exam      Future Appointments  Date Time Provider [place]  07/05/2022  1:00 PM Camillia Herter, NP PCE-PCE None  07/18/2022 10:00 AM MC-HV[facility] PA/NP MC-[facility] None  07/19/2022  8:00 AM MC-MR 1 MC-MRI [facility]  08/21/2022  9:00 AM [name], MD [place] None       [name], [place] [facility] Paramedic  06/13/22

## 2022-06-18 ENCOUNTER — Other Ambulatory Visit (HOSPITAL_COMMUNITY): Payer: Self-pay

## 2022-06-18 ENCOUNTER — Other Ambulatory Visit: Payer: Self-pay

## 2022-06-20 ENCOUNTER — Other Ambulatory Visit (HOSPITAL_COMMUNITY): Payer: Self-pay | Admitting: Emergency Medicine

## 2022-06-20 NOTE — Progress Notes (Signed)
Paramedicine Encounter    Patient ID: Eric Larson, male    DOB: [birth year], 61 y.o.   MRN: [id number]   There were no vitals taken for this visit. Weight yesterday-not taken Last visit weight-204lb  ATF Mr. [place]ker A&O x 4, skin W&D w/ good color.  Pt is compliant with all meds.  He has some prescriptions that need to be picked up at [facility] and advised he will pick them up before our next visit.  Pt denies chest pain or SOB.  Lung sounds clear and equal bilat.  No edema noted.  He is maintaining his weight without issue.  Home visit complete.   Patient Care Team: [name], NP as PCP - General [name], MD as PCP - Advanced Heart Failure (Cardiology)  Patient Active Problem List   Diagnosis Date Noted   Chronic systolic heart failure ([place]) 01/18/2022   Acute heart failure ([place]) 01/07/2022   Essential hypertension 01/07/2022   Diabetes mellitus ([place]) 10/22/2013   Hypothyroidism 10/08/2013   AKI (acute kidney injury) ([place]) 10/07/2013   Hyperglycemia without ketosis 10/06/2013   DKA (diabetic ketoacidoses) 10/06/2013   Elevated blood pressure 10/06/2013   History of gout 10/06/2013   History of goiter 10/06/2013   Type 2 diabetes mellitus with hyperosmolar nonketotic hyperglycemia ([place]) 10/06/2013   CKD (chronic kidney disease) stage 3, GFR 30-59 ml/min (HCC) 10/06/2013   Obesity 10/06/2013   GERD (gastroesophageal reflux disease) 10/06/2013    Current Outpatient Medications:    aspirin EC 81 MG tablet, Take 1 tablet (81 mg total) by mouth daily. Swallow whole., Disp: 30 tablet, Rfl:    Blood Glucose Monitoring Suppl (TRUE METRIX METER) W/DEVICE KIT, 1 each by Does not apply route 3 (three) times daily., Disp: 1 kit, Rfl: 0   carvedilol (COREG) 3.125 MG tablet, Take 1 tablet (3.125 mg total) by mouth 2 (two) times daily., Disp: 180 tablet, Rfl: 3   dapagliflozin propanediol (FARXIGA) 10 MG TABS tablet, Take 1 tablet (10 mg total) by mouth  daily., Disp: 90 tablet, Rfl: 3   digoxin (LANOXIN) 0.125 MG tablet, Take 1 tablet (0.125 mg total) by mouth daily., Disp: 90 tablet, Rfl: 3   glucose blood test strip, Use as instructed, Disp: 100 each, Rfl: 12   hydrALAZINE (APRESOLINE) 50 MG tablet, Take 1 tablet (50 mg total) by mouth 3 (three) times daily., Disp: 270 tablet, Rfl: 3   isosorbide mononitrate (IMDUR) 30 MG 24 hr tablet, Take 1 tablet (30 mg total) by mouth daily., Disp: 90 tablet, Rfl: 3   levothyroxine (SYNTHROID, LEVOTHROID) 75 MCG tablet, Take 1 tablet (75 mcg total) by mouth daily. (Patient not taking: Reported on 06/06/2022), Disp: 30 tablet, Rfl: 2   metFORMIN (GLUCOPHAGE) 1000 MG tablet, Take 1,000 mg by mouth 2 (two) times daily with a meal., Disp: , Rfl:    mexiletine (MEXITIL) 150 MG capsule, Take 1 capsule (150 mg total) by mouth every 12 (twelve) hours. NEED OV., Disp: 180 capsule, Rfl: 0   rosuvastatin (CRESTOR) 20 MG tablet, Take 1 tablet (20 mg total) by mouth daily., Disp: 90 tablet, Rfl: 3   sacubitril-valsartan (ENTRESTO) 97-103 MG, Take 1 tablet by mouth 2 (two) times daily., Disp: 60 tablet, Rfl: 11   spironolactone (ALDACTONE) 25 MG tablet, Take 1 tablet (25 mg total) by mouth daily., Disp: 90 tablet, Rfl: 3 No Known Allergies    Social History   Socioeconomic History   Marital status: Single    Spouse name:  Not on file   Number of children: Not on file   Years of education: Not on file   Highest education level: Not on file  Occupational History   Not on file  Tobacco Use   Smoking status: Never   Smokeless tobacco: Never  Substance and Sexual Activity   Alcohol use: No   Drug use: No   Sexual activity: Yes  Other Topics Concern   Not on file  Social History Narrative   Not on file   Social Determinants of Health   Financial Resource Strain: Not on file  Food Insecurity: Not on file  Transportation Needs: Not on file  Physical Activity: Not on file  Stress: Not on file  Social  Connections: Not on file  Intimate Partner Violence: Not on file    Physical Exam      Future Appointments  Date Time Provider [place]  07/05/2022  1:00 PM Camillia Herter, NP PCE-PCE None  07/18/2022 10:00 AM MC-HVSC PA/NP MC-HVSC None  07/19/2022  8:00 AM MC-MR 1 MC-MRI [facility]  08/21/2022  9:00 AM [name], MD [place] None       [name], [place] [facility] Paramedic  06/20/22

## 2022-06-21 ENCOUNTER — Other Ambulatory Visit (HOSPITAL_COMMUNITY): Payer: Self-pay

## 2022-06-25 ENCOUNTER — Telehealth (HOSPITAL_COMMUNITY): Payer: Self-pay | Admitting: Surgery

## 2022-06-25 NOTE — Telephone Encounter (Signed)
I called patient regarding Zio monitor and the fact that it had not been received back as of yet.  He was not aware that he needed to mail it in and still has it in his possession.  He says he will mail it back in tomorrow.

## 2022-06-26 NOTE — Progress Notes (Signed)
Erroneous encounter-disregard

## 2022-06-27 ENCOUNTER — Other Ambulatory Visit (HOSPITAL_COMMUNITY): Payer: Self-pay

## 2022-07-03 ENCOUNTER — Other Ambulatory Visit: Payer: Self-pay

## 2022-07-03 ENCOUNTER — Other Ambulatory Visit (HOSPITAL_COMMUNITY): Payer: Self-pay

## 2022-07-05 ENCOUNTER — Encounter: Payer: Medicaid Other | Admitting: Family

## 2022-07-05 DIAGNOSIS — E039 Hypothyroidism, unspecified: Secondary | ICD-10-CM

## 2022-07-05 DIAGNOSIS — N1831 Chronic kidney disease, stage 3a: Secondary | ICD-10-CM

## 2022-07-05 DIAGNOSIS — Z7689 Persons encountering health services in other specified circumstances: Secondary | ICD-10-CM

## 2022-07-05 DIAGNOSIS — E119 Type 2 diabetes mellitus without complications: Secondary | ICD-10-CM

## 2022-07-10 ENCOUNTER — Other Ambulatory Visit (HOSPITAL_COMMUNITY): Payer: Self-pay | Admitting: Emergency Medicine

## 2022-07-10 NOTE — Progress Notes (Signed)
Paramedicine Encounter    Patient ID: Eric Larson, male    DOB: [birth year], 61 y.o.   MRN: [id number]   BP (!) 130/100 (BP Location: Right Arm, Patient Position: Sitting, Cuff Size: Normal)   Pulse 62   Resp 16   Wt 206 lb (93.4 kg)   SpO2 96%   BMI 27.94 kg/m  Weight yesterday-not  taken Last visit weight-204lb CBG 189  ATF Eric Larson A&O x 4, skin W&D w/ good color.  Pt. Does well taking his meds.  His friend Eric Larson is still doing his pill box and it is in order.  He denies chest pain or SOB.  Lung sounds clear and equal bilat.  No edema noted.  Reminded pt. Of appointment 11/16 @ 10:00.  Home visit complete.     Eric Larson, Eric Larson 07/10/2022   Patient Care Team: [name], NP as PCP - General [name], MD as PCP - Advanced Heart Failure (Cardiology)  Patient Active Problem List   Diagnosis Date Noted  . Chronic systolic heart failure ([place]) 01/18/2022  . Acute heart failure ([place]) 01/07/2022  . Essential hypertension 01/07/2022  . Diabetes mellitus ([place]) 10/22/2013  . Hypothyroidism 10/08/2013  . AKI (acute kidney injury) ([place]) 10/07/2013  . Hyperglycemia without ketosis 10/06/2013  . DKA (diabetic ketoacidoses) 10/06/2013  . Elevated blood pressure 10/06/2013  . History of gout 10/06/2013  . History of goiter 10/06/2013  . Type 2 diabetes mellitus with hyperosmolar nonketotic hyperglycemia ([place]) 10/06/2013  . CKD (chronic kidney disease) stage 3, GFR 30-59 ml/min (HCC) 10/06/2013  . Obesity 10/06/2013  . GERD (gastroesophageal reflux disease) 10/06/2013    Current Outpatient Medications:  .  aspirin EC 81 MG tablet, Take 1 tablet (81 mg total) by mouth daily. Swallow whole., Disp: 30 tablet, Rfl:  .  Blood Glucose Monitoring Suppl (TRUE METRIX METER) W/DEVICE KIT, 1 each by Does not apply route 3 (three) times daily., Disp: 1 kit, Rfl: 0 .  carvedilol (COREG) 3.125 MG tablet, Take 1 tablet (3.125 mg total) by mouth 2 (two)  times daily., Disp: 180 tablet, Rfl: 3 .  dapagliflozin propanediol (FARXIGA) 10 MG TABS tablet, Take 1 tablet (10 mg total) by mouth daily., Disp: 90 tablet, Rfl: 3 .  digoxin (LANOXIN) 0.125 MG tablet, Take 1 tablet (0.125 mg total) by mouth daily., Disp: 90 tablet, Rfl: 3 .  glucose blood test strip, Use as instructed, Disp: 100 each, Rfl: 12 .  hydrALAZINE (APRESOLINE) 50 MG tablet, Take 1 tablet (50 mg total) by mouth 3 (three) times daily., Disp: 270 tablet, Rfl: 3 .  isosorbide mononitrate (IMDUR) 30 MG 24 hr tablet, Take 1 tablet (30 mg total) by mouth daily., Disp: 90 tablet, Rfl: 3 .  metFORMIN (GLUCOPHAGE) 1000 MG tablet, Take 1,000 mg by mouth 2 (two) times daily with a meal., Disp: , Rfl:  .  mexiletine (MEXITIL) 150 MG capsule, Take 1 capsule (150 mg total) by mouth every 12 (twelve) hours. NEED OV., Disp: 180 capsule, Rfl: 0 .  rosuvastatin (CRESTOR) 20 MG tablet, Take 1 tablet (20 mg total) by mouth daily., Disp: 90 tablet, Rfl: 3 .  sacubitril-valsartan (ENTRESTO) 97-103 MG, Take 1 tablet by mouth 2 (two) times daily., Disp: 60 tablet, Rfl: 11 .  spironolactone (ALDACTONE) 25 MG tablet, Take 1 tablet (25 mg total) by mouth daily., Disp: 90 tablet, Rfl: 3 .  levothyroxine (SYNTHROID, LEVOTHROID) 75 MCG tablet, Take 1 tablet (75 mcg total) by mouth daily. (Patient not taking:  Reported on 06/06/2022), Disp: 30 tablet, Rfl: 2 No Known Allergies    Social History   Socioeconomic History  . Marital status: Single    Spouse name: Not on file  . Number of children: Not on file  . Years of education: Not on file  . Highest education level: Not on file  Occupational History  . Not on file  Tobacco Use  . Smoking status: Never  . Smokeless tobacco: Never  Substance and Sexual Activity  . Alcohol use: No  . Drug use: No  . Sexual activity: Yes  Other Topics Concern  . Not on file  Social History Narrative  . Not on file   Social Determinants of Health   Financial Resource  Strain: Not on file  Food Insecurity: Not on file  Transportation Needs: Not on file  Physical Activity: Not on file  Stress: Not on file  Social Connections: Not on file  Intimate Partner Violence: Not on file    Physical Exam      Future Appointments  Date Time Provider [place]  07/18/2022 10:00 AM MC-[facility] PA/NP MC-HVSC None  07/19/2022  8:00 AM MC-MR 1 MC-MRI [facility]  08/21/2022  9:00 AM [name], MD [place] None       Eric Larson, [place] [facility] Paramedic  07/10/22

## 2022-07-18 ENCOUNTER — Encounter: Payer: Medicaid Other | Admitting: *Deleted

## 2022-07-18 ENCOUNTER — Other Ambulatory Visit: Payer: Self-pay

## 2022-07-18 ENCOUNTER — Other Ambulatory Visit: Payer: Self-pay | Admitting: Physician Assistant

## 2022-07-18 ENCOUNTER — Encounter (HOSPITAL_COMMUNITY): Payer: Self-pay

## 2022-07-18 ENCOUNTER — Telehealth (HOSPITAL_COMMUNITY): Payer: Self-pay | Admitting: *Deleted

## 2022-07-18 ENCOUNTER — Ambulatory Visit (HOSPITAL_COMMUNITY)
Admission: RE | Admit: 2022-07-18 | Discharge: 2022-07-18 | Disposition: A | Discharge status: Home / Self Care | Payer: Medicaid Other | Source: Ambulatory Visit | Attending: Family Medicine | Admitting: Family Medicine

## 2022-07-18 ENCOUNTER — Other Ambulatory Visit (HOSPITAL_COMMUNITY): Payer: Self-pay

## 2022-07-18 VITALS — BP 138/86 | HR 66 | Wt 207.0 lb

## 2022-07-18 VITALS — BP 138/86 | HR 66 | Wt 207.4 lb

## 2022-07-18 DIAGNOSIS — I5022 Chronic systolic (congestive) heart failure: Secondary | ICD-10-CM | POA: Diagnosis not present

## 2022-07-18 DIAGNOSIS — I340 Nonrheumatic mitral (valve) insufficiency: Secondary | ICD-10-CM

## 2022-07-18 DIAGNOSIS — Z79899 Other long term (current) drug therapy: Secondary | ICD-10-CM | POA: Diagnosis not present

## 2022-07-18 DIAGNOSIS — I493 Ventricular premature depolarization: Secondary | ICD-10-CM | POA: Diagnosis not present

## 2022-07-18 DIAGNOSIS — I2510 Atherosclerotic heart disease of native coronary artery without angina pectoris: Secondary | ICD-10-CM

## 2022-07-18 DIAGNOSIS — E1122 Type 2 diabetes mellitus with diabetic chronic kidney disease: Secondary | ICD-10-CM | POA: Insufficient documentation

## 2022-07-18 DIAGNOSIS — Z006 Encounter for examination for normal comparison and control in clinical research program: Secondary | ICD-10-CM

## 2022-07-18 DIAGNOSIS — N183 Chronic kidney disease, stage 3 unspecified: Secondary | ICD-10-CM

## 2022-07-18 DIAGNOSIS — I10 Essential (primary) hypertension: Secondary | ICD-10-CM | POA: Diagnosis not present

## 2022-07-18 DIAGNOSIS — I130 Hypertensive heart and chronic kidney disease with heart failure and stage 1 through stage 4 chronic kidney disease, or unspecified chronic kidney disease: Secondary | ICD-10-CM | POA: Insufficient documentation

## 2022-07-18 LAB — BASIC METABOLIC PANEL
Anion gap: 8 (ref 5–15)
BUN: 14 mg/dL (ref 8–23)
CO2: 23 mmol/L (ref 22–32)
Calcium: 9.4 mg/dL (ref 8.9–10.3)
Chloride: 110 mmol/L (ref 98–111)
Creatinine, Ser: 1.28 mg/dL — ABNORMAL HIGH (ref 0.61–1.24)
GFR, Estimated: >60 mL/min (ref >60)
Glucose, Bld: 124 mg/dL — ABNORMAL HIGH (ref 70–99)
Potassium: 3.6 mmol/L (ref 3.5–5.1)
Sodium: 141 mmol/L (ref 135–145)

## 2022-07-18 MED ORDER — LEVOTHYROXINE SODIUM 75 MCG PO TABS
75.0000 ug | ORAL_TABLET | Freq: Every day | ORAL | 3 refills | Status: DC
  Filled 2022-07-18: qty 30, 30d supply, fill #0
  Filled 2022-08-19: qty 30, 30d supply, fill #1
  Filled 2022-09-17: qty 30, 30d supply, fill #2
  Filled 2022-10-25: qty 30, 30d supply, fill #3

## 2022-07-18 MED FILL — Mexiletine HCl Cap 150 MG: ORAL | 90 days supply | Qty: 180 | Fill #0 | Status: AC

## 2022-07-18 NOTE — Addendum Note (Signed)
Encounter addended by: Jacklynn Ganong, FNP on: 07/18/2022 11:55 AM  Actions taken: Clinical Note Signed

## 2022-07-18 NOTE — Progress Notes (Addendum)
ADVANCED HF CLINIC NOTE  Primary Care: [name], NP HF Cardiologist: Dr. 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  Admitted 5/23 with new acute heart failure. Given IV lasix. Echo showed EF 15-20% range with mildly dilated LV, moderately dilated RV with severely decreased systolic function, moderate-severe functional mitral regurgitation, dilated IVC. Underwent R/LHC showing mildly elevated filling pressures, low CO and 70% proximal LAD stenosis, 90% distal LAD stenosis,  80% stenosis proximally in a moderate D1. Films reviewed with Dr. [place] and decided not to intervene in the absence of chest pain and presence of diffuse cardiomyopathy, out of proportion to known disease. Started on milrinone and continued with diuresis. Drips weaned off and GDMT titrated. Started on mexiletine with frequent PVCs. Discharged home, weight 201 lbs.  Echo 8/23 showed EF 30-35%, mild LV dilation, moderate RV dysfunction with moderate MR, IVC normal.   Today he returns for HF follow up with paramedic, [place]. Overall feeling fine. He walks outside for exercise, works on cars without dyspnea. Denies palpitations, abnormal bleeding, CP, dizziness, edema, or PND/Orthopnea. Appetite ok. No fever or chills. Weight at home 204-206 pounds. Taking all medications, wife fills pull box. Has not needed any Lasix. Home BP 130/100-140/80.  ECG (personally reviewed): none ordered today.  Labs (5/23): K 4.4, creatinine 1.82, hgb 12.5 Labs (7/23): K 4.1, creatinine 1.56 Labs (8/23): K 4.1, creatinine 1.28 Labs (10/23): K 4.2, creatinine 1.59  PMH: 1. 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   - Echo (5/23): EF 15-20%, moderately dilated RV with severely decreased systolic function, moderate-severe functional mitral regurgitation.  - RHC/LHC (5/23): Mean RA 11, PA 40/21, mean PCWP  23, CI 1.89; 70% proximal LAD stenosis, 90% distal LAD stenosis,  80% stenosis proximally in a moderate D1.  - Echo (8/23): EF 30-35%, mild LV dilation, moderate RV dysfunction with moderate MR, IVC normal.  2. CAD: LHC (5/23) with 70% proximal LAD stenosis, 90% distal LAD stenosis,  80% stenosis proximally in a moderate D1. Managed medically.  3. HTN 4. DM2 5. 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 Gout 7. Hypothyroidism  Review of Systems: All systems reviewed and negative except as per HPI.   Current Outpatient Medications  Medication Sig Dispense Refill   aspirin EC 81 MG tablet Take 1 tablet (81 mg total) by mouth daily. Swallow whole. 30 tablet    Blood Glucose Monitoring Suppl (TRUE METRIX METER) W/DEVICE KIT 1 each by Does not apply route 3 (three) times daily. 1 kit 0   carvedilol (COREG) 3.125 MG tablet Take 1 tablet (3.125 mg total) by mouth 2 (two) times daily. 180 tablet 3   dapagliflozin propanediol (FARXIGA) 10 MG TABS tablet Take 1 tablet (10 mg total) by mouth daily. 90 tablet 3   digoxin (LANOXIN) 0.125 MG tablet Take 1 tablet (0.125 mg total) by mouth daily. 90 tablet 3   glucose blood test strip Use as instructed 100 each 12   hydrALAZINE (APRESOLINE) 50 MG tablet Take 1 tablet (50 mg total) by mouth 3 (three) times daily. 270 tablet 3   isosorbide mononitrate (IMDUR) 30 MG 24 hr tablet Take 1 tablet (30 mg total) by mouth daily. 90 tablet 3   levothyroxine (SYNTHROID, LEVOTHROID) 75 MCG tablet Take 1 tablet (75 mcg total) by mouth daily. 30 tablet 2   metFORMIN (GLUCOPHAGE) 1000 MG tablet Take 1,000 mg by mouth 2 (  two) times daily with a meal.     mexiletine (MEXITIL) 150 MG capsule Take 1 capsule (150 mg total) by mouth every 12 (twelve) hours. NEED OV. 180 capsule 0   rosuvastatin (CRESTOR) 20 MG tablet Take 1 tablet (20 mg total) by mouth daily. 90 tablet 3   sacubitril-valsartan (ENTRESTO) 97-103 MG Take 1 tablet by mouth 2 (two) times daily. 60 tablet 11    spironolactone (ALDACTONE) 25 MG tablet Take 1 tablet (25 mg total) by mouth daily. 90 tablet 3   No current facility-administered medications for this encounter.   No Known Allergies  Social History   Socioeconomic History   Marital status: Single    Spouse name: Not on file   Number of children: Not on file   Years of education: Not on file   Highest education level: Not on file  Occupational History   Not on file  Tobacco Use   Smoking status: Never   Smokeless tobacco: Never  Substance and Sexual Activity   Alcohol use: No   Drug use: No   Sexual activity: Yes  Other Topics Concern   Not on file  Social History Narrative   Not on file   Social Determinants of Health   Financial Resource Strain: Not on file  Food Insecurity: Not on file  Transportation Needs: Not on file  Physical Activity: Not on file  Stress: Not on file  Social Connections: Not on file  Intimate Partner Violence: Not on file   Family History  Problem Relation Age of Onset   Heart attack Father    Diabetes Mellitus II Neg Hx    CAD Neg Hx    BP 138/86   Pulse 66   Wt 94.1 kg (207 lb 6.4 oz)   SpO2 97%   BMI 28.13 kg/m   Wt Readings from Last 3 Encounters:  07/18/22 94.1 kg (207 lb 6.4 oz)  07/10/22 93.4 kg (206 lb)  06/20/22 92.5 kg (204 lb)   PHYSICAL EXAM: General:  NAD. No resp difficulty HEENT: Normal Neck: Supple. No JVD. Carotids 2+ bilat; no bruits. No lymphadenopathy or thryomegaly appreciated. Cor: PMI nondisplaced. Regular rate & rhythm. No rubs, gallops or murmurs. Lungs: Clear Abdomen: Soft, nontender, nondistended. No hepatosplenomegaly. No bruits or masses. Good bowel sounds. Extremities: No cyanosis, clubbing, rash, edema Neuro: Alert & oriented x 3, cranial nerves grossly intact. Moves all 4 extremities w/o difficulty. Affect pleasant.  ASSESSMENT & PLAN: 1. Chronic systolic CHF: Echo in 9/98 with EF 15-20% range with mildly dilated LV, moderately dilated RV with  severely decreased systolic function, moderate-severe functional mitral regurgitation, dilated IVC.  Does not have history of ETOH or drugs.  No strong FH of cardiomyopathy.  He was noted in 5/23 hospitalization to have frequent PVCs, may contribute to CMP.  RHC in 5/23 showed elevated filling pressures and low cardiac output (CI 1.89)>>started on Milrinone. LHC showed moderate LAD disease, however cardiomyopathy was out of proportion to degree of CAD. 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 Echo 8/23 showed some improvement, EF up to 30-35%, despite being out of his meds x 2 weeks prior.  Today, he is NYHA II and not volume overloaded on exam.  - Continue Entresto 97/103 mg bid. BMET today. - He does not need Lasix.  - Continue carvedilol 3.125 mg bid. - Continue dapagliflozin 10 daily.   - Continue hydralazine 50 mg tid + Imdur 30 mg daily. - Continue spironolactone 25 mg daily.  - Continue digoxin 0.125 mg daily.  Dig level 0.6 on 05/22/22. Consider stopping if EF improved on cMRI tomorrow. - cMRI to look for infiltrative disease is scheduled on 07/19/22.  - Narrow QRS, would not be CRT candidate. Would not place ICD yet as EF seems to have improved some.    2. CAD: Mod LAD and Diag disease on 5/23 cath. The proximal LAD lesion is not critical but looks to be around 70% (moderate stenosis).  This could potentially be intervened upon, as could the 1st diagonal (would not intervene on the severe far distal LAD stenosis).  However, patient reports no chest pain and his cardiomyopathy is diffuse and not explained by the moderate LAD disease. Would manage medically for now unless he develops chest pain.  - Continue ASA + statin.   3. CKD stage 3: suspect baseline diabetic nephropathy. BMET today. - Continue Farxiga.  4. Diabetes: Type 2. He is not on insulin. 5. HTN: Stable  - Continue GDMT as above. 6. Mitral regurgitation: Mod-severe functional MR on 5/23 echo, echo 8/23 showed moderate MR.   Continue to follow.  7. PVCs: Frequent during 5/23 admission. Asymptomatic.  - Continue mexiletine 150 mg bid.  - We repeated Zio monitor 14 day to assess PVC burden now that he is back on mexiletine, his wife just mailed monitor back last week. Results pending.    Follow up with Dr. Aundra Dubin next month as scheduled. He has cMRI tomorrow.   Eric Larson Emmalena Canny FNP-BC 07/18/2022  CARDIAC PHYSICAL EXAMINATION        _0 Not Done  Total JVP: 5_1  cm   (Normal = up to total of 7cm)   Peripheral Edema Grade: X? 0   ? 1   ? 2  ? 3 ? 4   PHYSICIANS DYSPNEA ASSESSMENT*                                 _2  Not Done  Dyspnea ? Yes ? No X  NYHA Class ? 1    ? 2 X    ? 3     ? 4  Speech Dyspnea ? Yes ? No X  Paroxysmal Nocturnal Dyspnea  ? Yes ? No X  Orthopnea ? Yes ? No X  Pulmonary Congestion (if yes, check severe under 'Medical Condition' below) ? Yes ? No X  Pleural Fluid ? Right ? Left ? Both      X ? N/A  Wheezing ? Yes ? No X  Severity of medical condition (select 'severe' if pulmonary congestion is present): ? Mild   ? Moderate ? Severe  Comments: __________________________________________________________________________________________________________________________________________________________________________________________________________________________________________

## 2022-07-18 NOTE — Patient Instructions (Signed)
It was great to see you today! No medication changes are needed at this time.  Labs today We will only contact you if something comes back abnormal or we need to make some changes. Otherwise no news is good news!   Keep follow up as scheduled   Do the following things EVERYDAY: Weigh yourself in the morning before breakfast. Write it down and keep it in a log. Take your medicines as prescribed Eat low salt foods--Limit salt (sodium) to 2000 mg per day.  Stay as active as you can everyday Limit all fluids for the day to less than 2 liters   At the [facility], you and your health needs are our priority. As part of our continuing mission to provide you with exceptional heart care, we have created designated Provider Care Teams. These Care Teams include your primary Cardiologist (physician) and Advanced Practice Providers (APPs- Physician Assistants and Nurse Practitioners) who all work together to provide you with the care you need, when you need it.   You may see any of the following providers on your designated Care Team at your next follow up: Dr Daniel Bensimhon Dr Dalton McLean Dr. Aditya Sabharwal Amy Clegg, NP Brittainy Simmons, PA Jessica Milford,NP Lindsay Finch, PA Alma Diaz, NP Lauren Kemp, PharmD   Please be sure to bring in all your medications bottles to every appointment.    If you have any questions or concerns before your next appointment please send us a message through mychart or call our office at [phone].    TO LEAVE A MESSAGE FOR THE NURSE SELECT OPTION 2, PLEASE LEAVE A MESSAGE INCLUDING: YOUR NAME DATE OF BIRTH CALL BACK NUMBER REASON FOR CALL**this is important as we prioritize the call backs  YOU WILL RECEIVE A CALL BACK THE SAME DAY AS LONG AS YOU CALL BEFORE 4:00 PM   

## 2022-07-18 NOTE — Telephone Encounter (Signed)
Reaching out to patient to offer assistance regarding upcoming cardiac imaging study; pt verbalizes understanding of appt date/time, parking situation and where to check in, and verified current allergies; name and call back number provided for further questions should they arise  Anapaula Severt RN Navigator Cardiac Imaging  Heart and Vascular [phone] office [phone] cell  Patient denies claustrophobia or metal. 

## 2022-07-18 NOTE — Research (Signed)
Opened in error

## 2022-07-18 NOTE — Research (Addendum)
Detect HF Informed Consent    Subject Name: Eric Larson   Subject met inclusion and exclusion criteria.  The informed consent form, study requirements and expectations were reviewed with the subject and questions and concerns were addressed prior to the signing of the consent form.  The subject verbalized understanding of the trial requirements.  The subject agreed to participate in the Detect HF trial and signed the informed consent on 16/Nov/2023  The informed consent was obtained prior to performance of any protocol-specific procedures for the subject.  A copy of the signed informed consent was given to the subject and a copy was placed in the subject's medical record.    Debbora Presto, RN Clinical Research Coordinator   Date of last ECHO: 30/Aug/2023  Date of last ECG: 30/Aug/2023  KCCQ: See worksheet   First patient training and registration of [employer] app: Completed    Inclusion  YES NO   1- Age 38 or greater   _0  _1    2-Diagnosed with Symptomatic Chronic Heart Failure NYHA (II-IV a)  _2  _3    3-At least one of the following:      a. One ADHF hospitalization in the last 12 months  _4  _5    b. One unplanned IV/[place] diuretic administration in the last 6 months   _6   _7    c. Two unplanned IV/[place]livia Lopez de Gutierrez diuretic administration in the last 12 months   _8  _9    d. NTProBNP >500 or BNP >150 at screening visit or in the last 30 days. *BNP>150 only for patients that are currently NOT being treated with Entresto*  _10  _11    4-Clinical stable HF according to investigator discretion   _12  _13    5-Willing to participate as evidenced by signing the written informed consent   _14  _15   6-Male or non-pregnant male patient (pre menopausal women will confirm verbally) _16  _17     Exclusion     1-Not able to read in [place] or Spanish  _18  _19    2-Unable to comply with daily use of the App eg, due to mental disorders (depression, dementia, etc.)  _20  _21    3-Has had a major  cardiovascular event ([place]) within 3 months prior to enrollment  _22  _23    4-Had a Cardiac Resynchronization Therapy Device (CRT) implanted or upgrading <1 month prior to screening visit  _24  _25    5-Has estimated Glomerular Filtration Rate (eGFR) <85m/min/1.72 meter square  _26  _27    6-Is likely to undergo heart transplantation/LVAD within 6 months of screening visit  _28  _29    7-Was treated for a significant COPD (GOLD 3 or 4 or need for home oxygen use) following a pulmonologist diagnosis and treatment   _30  _31    8-History of alcohol or substance abuse or of non-compliance (eg, medications, exams) within the last 12 months, according to investigator description   _32  _33    9-Life expectancy <12 months   _34  _35    10-Has evidence of active respiratory infection  _36  _37    11-Primary restrictive cardiomyopathy or constrictive pericarditis   _38  _39    12-Is currently enrolled in another non observational remote heart failure monitoring study   _40  _41   13-Has any speech disorder, eg, stuttering, apraxia, dysarthria, etc. _42  _43   14-Male who is pregnant or plans to become pregnant within the course of the study _44  _45   15-Is receiving regularly scheduled IV/ diuretics therapy as part of their drug regimen _46  _47   16-Has received a heart, lung transplant or mechanical circulatory support  _48  _49   17-Patient has  CardioMEMS implanted or has another implanted device or other monitoring program (investigational or not) that is being used for home monitoring of fluid volume status _0  _1   18-Site/sponsor employee or family member of a site/sponsor employee  _2  _3     CARDIAC PHYSICAL EXAMINATION        _4 Not Done  Total JVP: 5 cm   (Normal = up to total of 7cm)   Peripheral Edema Grade: X 0   ? 1   ? 2  ? 3 ? 4   PHYSICIANS DYSPNEA ASSESSMENT*                                 _5  Not Done  Dyspnea ? Yes X No  NYHA Class ? 1    X 2     ? 3     ? 4  Speech Dyspnea ? Yes XNo   Paroxysmal Nocturnal Dyspnea  ? Yes X No  Orthopnea ? Yes X No  Pulmonary Congestion (if yes, check severe under 'Medical Condition' below) ? Yes X No  Pleural Fluid ? Right ? Left ? Both      X N/A  Wheezing ? Yes X No  Severity of medical condition (select 'severe' if pulmonary congestion is present): ? Mild   ? Moderate ? Severe  Comments: __________________________________________________________________________________________________________________________________________________________________________________________________________________________________________

## 2022-07-19 ENCOUNTER — Other Ambulatory Visit (HOSPITAL_COMMUNITY): Payer: Self-pay | Admitting: Cardiology

## 2022-07-19 ENCOUNTER — Encounter (HOSPITAL_COMMUNITY): Payer: Medicaid Other

## 2022-07-19 ENCOUNTER — Ambulatory Visit (HOSPITAL_BASED_OUTPATIENT_CLINIC_OR_DEPARTMENT_OTHER)
Admission: RE | Admit: 2022-07-19 | Discharge: 2022-07-19 | Disposition: A | Discharge status: Home / Self Care | Payer: Medicaid Other | Source: Ambulatory Visit | Attending: Cardiology | Admitting: Cardiology

## 2022-07-19 DIAGNOSIS — I5022 Chronic systolic (congestive) heart failure: Secondary | ICD-10-CM

## 2022-07-19 DIAGNOSIS — I130 Hypertensive heart and chronic kidney disease with heart failure and stage 1 through stage 4 chronic kidney disease, or unspecified chronic kidney disease: Secondary | ICD-10-CM | POA: Diagnosis not present

## 2022-07-19 LAB — PRO B NATRIURETIC PEPTIDE: NT-Pro BNP: 319 pg/mL — ABNORMAL HIGH (ref 0–210)

## 2022-07-19 LAB — CBC
Hematocrit: 40 % (ref 37.5–51.0)
Hemoglobin: 13.6 g/dL (ref 13.0–17.7)
MCH: 30.9 pg (ref 26.6–33.0)
MCHC: 34 g/dL (ref 31.5–35.7)
MCV: 91 fL (ref 79–97)
Platelets: 153 10*3/uL (ref 150–450)
RBC: 4.4 x10E6/uL (ref 4.14–5.80)
RDW: 13.3 % (ref 11.6–15.4)
WBC: 4.8 10*3/uL (ref 3.4–10.8)

## 2022-07-19 MED ORDER — GADOBUTROL 1 MMOL/ML IV SOLN
10.0000 mL | Freq: Once | INTRAVENOUS | Status: AC | PRN
  Administered 2022-07-19: 10 mL via INTRAVENOUS

## 2022-07-22 NOTE — Progress Notes (Signed)
Pt meets for Detect HF

## 2022-07-23 ENCOUNTER — Telehealth (HOSPITAL_COMMUNITY): Payer: Self-pay | Admitting: Surgery

## 2022-07-23 NOTE — Telephone Encounter (Signed)
I called patient again in reference to [facility] monitor.  He tells me that it was to be mailed back 1 week ago.  He will make sure that family member mailed and let me know if not.  Results are not available in [facility].

## 2022-07-24 ENCOUNTER — Telehealth (HOSPITAL_COMMUNITY): Payer: Self-pay | Admitting: *Deleted

## 2022-07-29 NOTE — Addendum Note (Signed)
Encounter addended by: Crissie Figures, RN on: 07/29/2022 10:13 AM  Actions taken: Imaging Exam ended

## 2022-07-30 DIAGNOSIS — Z006 Encounter for examination for normal comparison and control in clinical research program: Secondary | ICD-10-CM

## 2022-07-30 NOTE — Research (Signed)
Called pt regarding poor quality recordings for Detect HF. Pt stated he has recently had a cold. Educated pt on recording in quiet environment and speaking slowly/clearly. Pt verbalized understanding and will contact us with any questions.

## 2022-08-06 ENCOUNTER — Other Ambulatory Visit (HOSPITAL_COMMUNITY): Payer: Self-pay

## 2022-08-06 ENCOUNTER — Telehealth (HOSPITAL_COMMUNITY): Payer: Self-pay

## 2022-08-06 MED ORDER — CARVEDILOL 6.25 MG PO TABS
6.2500 mg | ORAL_TABLET | Freq: Two times a day (BID) | ORAL | 3 refills | Status: DC
  Filled 2022-08-06: qty 180, 90d supply, fill #0

## 2022-08-06 NOTE — Telephone Encounter (Signed)
Patient aware and agreeable. 

## 2022-08-08 ENCOUNTER — Other Ambulatory Visit (HOSPITAL_COMMUNITY): Payer: Self-pay | Admitting: Emergency Medicine

## 2022-08-08 DIAGNOSIS — Z006 Encounter for examination for normal comparison and control in clinical research program: Secondary | ICD-10-CM

## 2022-08-08 NOTE — Progress Notes (Signed)
Paramedicine Encounter    Patient ID: Eric Larson, male    DOB: Dec 15, 1960, 61 y.o.   MRN: [id number]   BP 120/80 (BP Location: Right Arm, Patient Position: Sitting, Cuff Size: Normal)   Pulse 69   Resp 16   Wt 205 lb (93 kg)   SpO2 97%   BMI 27.80 kg/m  Weight yesterday-not taken Last visit weight-206lb  Cbg 179  ATF Mr. Farve A&O x 4, skin W&D w/ good color.  Pt. Reports to be compliant with all medications.  His friend Eustaquio Maize fills his pill box and keeps bottles locked up in safe.   He denies chest pain or SOB.  Lung sounds clear and equal bilat.  No edema noted.  He says he feels great.  Reminded him of his upcoming appointment at the HF Clinic 08/21/22 @ 9:00 and he advised he would be there.  I told him we would do our next visit there at the clinic. Home visit complete.     [name], [place] 08/08/2022  Patient Care Team: [name], NP as PCP - General [name], MD as PCP - Advanced Heart Failure (Cardiology)  Patient Active Problem List   Diagnosis Date Noted   Chronic systolic heart failure ([place]) 01/18/2022   Acute heart failure ([place]) 01/07/2022   Essential hypertension 01/07/2022   Diabetes mellitus ([place]) 10/22/2013   Hypothyroidism 10/08/2013   AKI (acute kidney injury) ([place]) 10/07/2013   Hyperglycemia without ketosis 10/06/2013   DKA (diabetic ketoacidoses) 10/06/2013   Elevated blood pressure 10/06/2013   History of gout 10/06/2013   History of goiter 10/06/2013   Type 2 diabetes mellitus with hyperosmolar nonketotic hyperglycemia ([place]) 10/06/2013   CKD (chronic kidney disease) stage 3, GFR 30-59 ml/min (HCC) 10/06/2013   Obesity 10/06/2013   GERD (gastroesophageal reflux disease) 10/06/2013    Current Outpatient Medications:    aspirin EC 81 MG tablet, Take 1 tablet (81 mg total) by mouth daily. Swallow whole., Disp: 30 tablet, Rfl:    Blood Glucose Monitoring Suppl (TRUE METRIX METER) W/DEVICE KIT, 1 each by Does  not apply route 3 (three) times daily., Disp: 1 kit, Rfl: 0   carvedilol (COREG) 6.25 MG tablet, Take 1 tablet (6.25 mg total) by mouth 2 (two) times daily., Disp: 180 tablet, Rfl: 3   dapagliflozin propanediol (FARXIGA) 10 MG TABS tablet, Take 1 tablet (10 mg total) by mouth daily., Disp: 90 tablet, Rfl: 3   digoxin (LANOXIN) 0.125 MG tablet, Take 1 tablet (0.125 mg total) by mouth daily., Disp: 90 tablet, Rfl: 3   glucose blood test strip, Use as instructed, Disp: 100 each, Rfl: 12   hydrALAZINE (APRESOLINE) 50 MG tablet, Take 1 tablet (50 mg total) by mouth 3 (three) times daily., Disp: 270 tablet, Rfl: 3   isosorbide mononitrate (IMDUR) 30 MG 24 hr tablet, Take 1 tablet (30 mg total) by mouth daily., Disp: 90 tablet, Rfl: 3   levothyroxine (SYNTHROID) 75 MCG tablet, Take 1 tablet (75 mcg total) by mouth daily., Disp: 30 tablet, Rfl: 3   metFORMIN (GLUCOPHAGE) 1000 MG tablet, Take 1,000 mg by mouth 2 (two) times daily with a meal., Disp: , Rfl:    rosuvastatin (CRESTOR) 20 MG tablet, Take 1 tablet (20 mg total) by mouth daily., Disp: 90 tablet, Rfl: 3   sacubitril-valsartan (ENTRESTO) 97-103 MG, Take 1 tablet by mouth 2 (two) times daily., Disp: 60 tablet, Rfl: 11   spironolactone (ALDACTONE) 25 MG tablet, TAKE 1 TABLET (25 MG TOTAL)  BY MOUTH DAILY., Disp: 90 tablet, Rfl: 3   carvedilol (COREG) 3.125 MG tablet, Take 1 tablet (3.125 mg total) by mouth 2 (two) times daily. (Patient not taking: Reported on 08/08/2022), Disp: 180 tablet, Rfl: 3   mexiletine (MEXITIL) 150 MG capsule, Take 1 capsule (150 mg total) by mouth every 12 (twelve) hours. NEED OV., Disp: 180 capsule, Rfl: 0 No Known Allergies    Social History   Socioeconomic History   Marital status: Single    Spouse name: Not on file   Number of children: Not on file   Years of education: Not on file   Highest education level: Not on file  Occupational History   Not on file  Tobacco Use   Smoking status: Never   Smokeless  tobacco: Never  Substance and Sexual Activity   Alcohol use: No   Drug use: No   Sexual activity: Yes  Other Topics Concern   Not on file  Social History Narrative   Not on file   Social Determinants of Health   Financial Resource Strain: Not on file  Food Insecurity: Not on file  Transportation Needs: Not on file  Physical Activity: Not on file  Stress: Not on file  Social Connections: Not on file  Intimate Partner Violence: Not on file    Physical Exam      Future Appointments  Date Time Provider [place]  08/21/2022  9:00 AM [name], MD [place] None       [name], EMT-P-Paramedic [place] [facility]  08/08/22

## 2022-08-08 NOTE — Research (Addendum)
RUN-IN CORE QUALIFICATION  Date of Enrollment (per email): 06/Dec/2023  Type of Run-In Evaluation: X Initial               ? Re-Screen   CORE ELIGIBILITY CONFIRMATION  During the run-in period, the subject had NO change in their HF clinical status (i.e., no signs or symptoms of worsening HF) and/or HF-related medications. X Correct/Yes  ? Incorrect/No  The subject has met a creation of baseline recordings: X Yes ? No  a. If No, will subject be re-screened? ? Yes ? No  b. If No, reason for screen failure: _________________________________________________________________________________________________________________________________________________________________________________________________________  Number of days in Run-In Period: 19  Will subject continue to CORE period? X Yes ? No (proceed to next question)  a. If No, has Run-In failure form been completed? ? Yes  ? No  Enrollment Authorized by Name:  Bryson Dames, RN  Enrollment Authorized by Signature:      NYHA  NYHA Class: ? 1    X 2     ? 3     ? 4   USABILITY QUESTIONNAIRE  Has subject completed usability questionnaire? X Completed   ? Not Completed (explain):     ____________________________________________________ _____________________________________________________________    CONCOMITANT MEDICATIONS  Have there been any changes to the subject's medications since last visit?  If yes, please update Concomitant Medications Log.  Current Outpatient Medications:    aspirin EC 81 MG tablet, Take 1 tablet (81 mg total) by mouth daily. Swallow whole., Disp: 30 tablet, Rfl:    Blood Glucose Monitoring Suppl (TRUE METRIX METER) W/DEVICE KIT, 1 each by Does not apply route 3 (three) times daily., Disp: 1 kit, Rfl: 0   carvedilol (COREG) 3.125 MG tablet, Take 1 tablet (3.125 mg total) by mouth 2 (two) times daily. (Patient not taking: Reported on 08/08/2022), Disp: 180 tablet, Rfl: 3   carvedilol (COREG) 6.25 MG tablet, Take  1 tablet (6.25 mg total) by mouth 2 (two) times daily., Disp: 180 tablet, Rfl: 3   dapagliflozin propanediol (FARXIGA) 10 MG TABS tablet, Take 1 tablet (10 mg total) by mouth daily., Disp: 90 tablet, Rfl: 3   digoxin (LANOXIN) 0.125 MG tablet, Take 1 tablet (0.125 mg total) by mouth daily., Disp: 90 tablet, Rfl: 3   glucose blood test strip, Use as instructed, Disp: 100 each, Rfl: 12   hydrALAZINE (APRESOLINE) 50 MG tablet, Take 1 tablet (50 mg total) by mouth 3 (three) times daily., Disp: 270 tablet, Rfl: 3   isosorbide mononitrate (IMDUR) 30 MG 24 hr tablet, Take 1 tablet (30 mg total) by mouth daily., Disp: 90 tablet, Rfl: 3   levothyroxine (SYNTHROID) 75 MCG tablet, Take 1 tablet (75 mcg total) by mouth daily., Disp: 30 tablet, Rfl: 3   metFORMIN (GLUCOPHAGE) 1000 MG tablet, Take 1,000 mg by mouth 2 (two) times daily with a meal., Disp: , Rfl:    mexiletine (MEXITIL) 150 MG capsule, Take 1 capsule (150 mg total) by mouth every 12 (twelve) hours. NEED OV., Disp: 180 capsule, Rfl: 0   rosuvastatin (CRESTOR) 20 MG tablet, Take 1 tablet (20 mg total) by mouth daily., Disp: 90 tablet, Rfl: 3   sacubitril-valsartan (ENTRESTO) 97-103 MG, Take 1 tablet by mouth 2 (two) times daily., Disp: 60 tablet, Rfl: 11   spironolactone (ALDACTONE) 25 MG tablet, TAKE 1 TABLET (25 MG TOTAL) BY MOUTH DAILY., Disp: 90 tablet, Rfl: 3  X Yes ? No   ADVERSE EVENTS  Have any new adverse events (AEs) occurred since  last visit?  If yes, please update Adverse Events Log. ? Yes X No

## 2022-08-14 ENCOUNTER — Other Ambulatory Visit (HOSPITAL_COMMUNITY): Payer: Self-pay

## 2022-08-19 ENCOUNTER — Other Ambulatory Visit (HOSPITAL_COMMUNITY): Payer: Self-pay

## 2022-08-20 ENCOUNTER — Telehealth (HOSPITAL_COMMUNITY): Payer: Self-pay | Admitting: Emergency Medicine

## 2022-08-20 NOTE — Telephone Encounter (Signed)
Sent reminder via text of HF Clinic appointment with Dr. Shirlee Latch tomorrow @ 9:00.    Beatrix Shipper, EMT-Paramedic [phone] 08/20/2022

## 2022-08-21 ENCOUNTER — Other Ambulatory Visit (HOSPITAL_COMMUNITY): Payer: Self-pay | Admitting: Emergency Medicine

## 2022-08-21 ENCOUNTER — Other Ambulatory Visit (HOSPITAL_COMMUNITY): Payer: Self-pay

## 2022-08-21 ENCOUNTER — Encounter (HOSPITAL_COMMUNITY): Payer: Self-pay | Admitting: Cardiology

## 2022-08-21 ENCOUNTER — Other Ambulatory Visit: Payer: Self-pay

## 2022-08-21 ENCOUNTER — Ambulatory Visit (HOSPITAL_COMMUNITY)
Admission: RE | Admit: 2022-08-21 | Discharge: 2022-08-21 | Disposition: A | Discharge status: Home / Self Care | Payer: Medicaid Other | Source: Ambulatory Visit | Attending: Cardiology | Admitting: Cardiology

## 2022-08-21 VITALS — BP 120/70 | HR 70 | Wt 204.6 lb

## 2022-08-21 DIAGNOSIS — E119 Type 2 diabetes mellitus without complications: Secondary | ICD-10-CM | POA: Insufficient documentation

## 2022-08-21 DIAGNOSIS — I493 Ventricular premature depolarization: Secondary | ICD-10-CM | POA: Diagnosis not present

## 2022-08-21 DIAGNOSIS — I340 Nonrheumatic mitral (valve) insufficiency: Secondary | ICD-10-CM

## 2022-08-21 DIAGNOSIS — I2510 Atherosclerotic heart disease of native coronary artery without angina pectoris: Secondary | ICD-10-CM | POA: Diagnosis not present

## 2022-08-21 DIAGNOSIS — I10 Essential (primary) hypertension: Secondary | ICD-10-CM | POA: Diagnosis not present

## 2022-08-21 DIAGNOSIS — I429 Cardiomyopathy, unspecified: Secondary | ICD-10-CM | POA: Diagnosis not present

## 2022-08-21 DIAGNOSIS — I5022 Chronic systolic (congestive) heart failure: Secondary | ICD-10-CM | POA: Diagnosis not present

## 2022-08-21 DIAGNOSIS — N183 Chronic kidney disease, stage 3 unspecified: Secondary | ICD-10-CM | POA: Insufficient documentation

## 2022-08-21 LAB — BASIC METABOLIC PANEL
Anion gap: 8 (ref 5–15)
BUN: 19 mg/dL (ref 8–23)
CO2: 22 mmol/L (ref 22–32)
Calcium: 9.4 mg/dL (ref 8.9–10.3)
Chloride: 110 mmol/L (ref 98–111)
Creatinine, Ser: 1.4 mg/dL — ABNORMAL HIGH (ref 0.61–1.24)
GFR, Estimated: 57 mL/min — ABNORMAL LOW (ref >60)
Glucose, Bld: 153 mg/dL — ABNORMAL HIGH (ref 70–99)
Potassium: 4 mmol/L (ref 3.5–5.1)
Sodium: 140 mmol/L (ref 135–145)

## 2022-08-21 LAB — DIGOXIN LEVEL: Digoxin Level: 0.8 ng/mL (ref 0.8–2.0)

## 2022-08-21 MED ORDER — ENTRESTO 97-103 MG PO TABS
1.0000 | ORAL_TABLET | Freq: Two times a day (BID) | ORAL | 11 refills | Status: DC
  Filled 2022-08-21 – 2022-09-17 (×2): qty 60, 30d supply, fill #0
  Filled 2022-10-25: qty 60, 30d supply, fill #1
  Filled 2022-12-12: qty 60, 30d supply, fill #2

## 2022-08-21 MED ORDER — DAPAGLIFLOZIN PROPANEDIOL 10 MG PO TABS
10.0000 mg | ORAL_TABLET | Freq: Every day | ORAL | 3 refills | Status: DC
  Filled 2022-08-21 – 2022-10-02 (×3): qty 90, 90d supply, fill #0

## 2022-08-21 MED ORDER — SPIRONOLACTONE 25 MG PO TABS
25.0000 mg | ORAL_TABLET | Freq: Every day | ORAL | 3 refills | Status: DC
  Filled 2022-08-21 – 2022-09-30 (×4): qty 90, 90d supply, fill #0

## 2022-08-21 MED ORDER — CARVEDILOL 12.5 MG PO TABS
12.5000 mg | ORAL_TABLET | Freq: Two times a day (BID) | ORAL | 3 refills | Status: DC
  Filled 2022-08-21 – 2022-09-30 (×3): qty 180, 90d supply, fill #0

## 2022-08-21 NOTE — Progress Notes (Signed)
Blood collected for TTR genetic testing per Dr McLean.  Order form completed and both shipped by Fed[employer] to [facility].  

## 2022-08-21 NOTE — Patient Instructions (Signed)
STOP Digoxin  INCREASE Carvedilol to 12.5mg  Twice daily  Labs done today, your results will be available in MyChart, we will contact you for abnormal readings.  Genetic test has been done, this has to be sent to [place] to be processed and can take 1-2 weeks to get results back.  We will let you know the results.  You are scheduled for a TEE (Transesophageal Echocardiogram) on Friday, January 5 with Dr. Shirlee Latch.  Please arrive at the [facility] (Main Entrance A) at [facility]: [address] [place], [place] [address] at 9:30 AM.   DIET:  Nothing to eat or drink after midnight except a sip of water with medications (see medication instructions below)  MEDICATION INSTRUCTIONS:  Hold Spironolactone the morning of your procedure    FYI:  For your safety, and to allow [place] to monitor your vital signs accurately during the surgery/procedure we request: If you have artificial nails, gel coating, SNS etc, please have those removed prior to your surgery/procedure. Not having the nail coverings /polish removed may result in cancellation or delay of your surgery/procedure.  You must have a responsible person to drive you home and stay in the waiting area during your procedure. Failure to do so could result in cancellation.  Bring your insurance cards.  *Special Note: Every effort is made to have your procedure done on time. Occasionally there are emergencies that occur at the hospital that may cause delays. Please be patient if a delay does occur.    Your physician recommends that you schedule a follow-up appointment in: 3 months  If you have any questions or concerns before your next appointment please send [place] a message through [place] or call our office at [phone].    TO LEAVE A MESSAGE FOR THE NURSE SELECT OPTION 2, PLEASE LEAVE A MESSAGE INCLUDING: YOUR NAME DATE OF BIRTH CALL BACK NUMBER REASON FOR CALL**this is important as we prioritize the call backs  YOU WILL  RECEIVE A CALL BACK THE SAME DAY AS LONG AS YOU CALL BEFORE 4:00 PM  At the [facility], you and your health needs are our priority. As part of our continuing mission to provide you with exceptional heart care, we have created designated Provider Care Teams. These Care Teams include your primary Cardiologist (physician) and Advanced Practice Providers (APPs- Physician Assistants and Nurse Practitioners) who all work together to provide you with the care you need, when you need it.   You may see any of the following providers on your designated Care Team at your next follow up: Dr Arvilla Meres Dr Marca Ancona Dr. Marcos Eke, NP Robbie Lis, [place] [facility] [place], [place] Brynda Peon, NP Karle Plumber, PharmD   Please be sure to bring in all your medications bottles to every appointment.

## 2022-08-21 NOTE — Progress Notes (Signed)
Paramedicine Encounter   Patient ID: Eric Larson , male,   DOB: August 15, 1961,61 y.o.,  MRN: [id number]   Met patient in clinic today with provider.  Time spent with patient 45 minutes  Dr. Aundra Dubin reviewed the results of Mr. Mangel MRI which shows EF improvement from 25% to 49%. 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   To schedule TEE  to evaluate valve regurgitation. Med changes:  Stop taking Digoxin and increase Carvedilol to 12.30m. BID. Refills needed for Farxiga, Spironolactone and Entresto Visit complete.   DRenee Ramus [place]12/20/2023

## 2022-08-21 NOTE — Progress Notes (Signed)
ADVANCED HF CLINIC NOTE  Primary Care: [name], NP HF Cardiologist: Dr. 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  Admitted 5/23 with new acute heart failure. Given IV lasix. Echo showed EF 15-20% range with mildly dilated LV, moderately dilated RV with severely decreased systolic function, moderate-severe functional mitral regurgitation, dilated IVC. Underwent R/LHC showing mildly elevated filling pressures, low CO and 70% proximal LAD stenosis, 90% distal LAD stenosis,  80% stenosis proximally in a moderate D1. Films reviewed with Dr. [place] and decided not to intervene in the absence of chest pain and presence of diffuse cardiomyopathy, out of proportion to known disease. Started on milrinone and continued with diuresis. Drips weaned off and GDMT titrated. Started on mexiletine with frequent PVCs. Discharged home, weight 201 lbs.  Echo 8/23 showed EF 30-35%, mild LV dilation, moderate RV dysfunction with moderate MR, IVC normal.   Cardiac MRI in 11/23 showed LV EF 49% with moderate LVH, RV EF 47%, severe MR by regurgitant fraction (41%) but not visually, noncoronary LGE noted in the mid-wall basal septum, mid-wall basal-mid inferior wall, mid-wall basal anterolateral wall.   Zio monitor (11/23) showed 7.7% PVCs, Coreg was increased.   Today he returns for HF follow up with paramedic, [place]. Weight down 3 lbs. He is doing well symptomatically.  No exertional dyspnea or chest pain.  No lightheadedness. He is tolerating all his meds.  No palpitations.  No orthopnea/PND.    ECG (personally reviewed): NSR, anterolateral TWIs  Labs (5/23): K 4.4, creatinine 1.82, hgb 12.5 Labs (7/23): K 4.1, creatinine 1.56 Labs (8/23): K 4.1, creatinine 1.28 Labs (10/23): K 4.2, creatinine 1.59 Labs (11/23): NT-pro-BNP 319, K 3.6, creatinine 1.28  PMH: 1. 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   - Echo (5/23): EF 15-20%, moderately dilated RV with severely decreased systolic function, moderate-severe functional mitral regurgitation.  - RHC/LHC (5/23): Mean RA 11, PA 40/21, mean PCWP 23, CI 1.89; 70% proximal LAD stenosis, 90% distal LAD stenosis,  80% stenosis proximally in a moderate D1.  - Echo (8/23): EF 30-35%, mild LV dilation, moderate RV dysfunction with moderate MR, IVC normal.  - Cardiac MRI in 11/23 showed LV EF 49% with moderate LVH, RV EF 47%, severe MR by regurgitant fraction (41%) but not visually, noncoronary LGE noted in the mid-wall basal septum, mid-wall basal-mid inferior wall, mid-wall basal anterolateral wall.  2. CAD: LHC (5/23) with 70% proximal LAD stenosis, 90% distal LAD stenosis,  80% stenosis proximally in a moderate D1. Managed medically.  3. HTN 4. DM2 5. 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 Gout 7. Hypothyroidism 8. PVCs: Zio monitor 11/23 with 7.7% PVCs.   Review of Systems: All systems reviewed and negative except as per HPI.   Current Outpatient Medications  Medication Sig Dispense Refill   aspirin EC 81 MG tablet Take 1 tablet (81 mg total) by mouth daily. Swallow whole. 30 tablet    Blood Glucose Monitoring Suppl (TRUE METRIX METER) W/DEVICE KIT 1 each by Does not apply route 3 (three) times daily. 1 kit 0   glucose blood test strip Use as instructed 100 each 12   hydrALAZINE (APRESOLINE) 50 MG tablet Take 1 tablet (50 mg total) by mouth 3 (three) times daily. 270 tablet 3   isosorbide mononitrate (IMDUR) 30 MG 24 hr tablet Take 1 tablet (30 mg total) by mouth daily. 90 tablet 3  levothyroxine (SYNTHROID) 75 MCG tablet Take 1 tablet (75 mcg total) by mouth daily. 30 tablet 3   metFORMIN (GLUCOPHAGE) 1000 MG tablet Take 1,000 mg by mouth 2 (two) times daily with a meal.     mexiletine (MEXITIL) 150 MG capsule Take 1 capsule (150 mg total) by mouth every 12 (twelve) hours. NEED OV. 180 capsule 0    rosuvastatin (CRESTOR) 20 MG tablet Take 1 tablet (20 mg total) by mouth daily. 90 tablet 3   carvedilol (COREG) 12.5 MG tablet Take 1 tablet (12.5 mg total) by mouth 2 (two) times daily. 180 tablet 3   dapagliflozin propanediol (FARXIGA) 10 MG TABS tablet Take 1 tablet (10 mg total) by mouth daily. 90 tablet 3   sacubitril-valsartan (ENTRESTO) 97-103 MG Take 1 tablet by mouth 2 (two) times daily. 60 tablet 11   spironolactone (ALDACTONE) 25 MG tablet Take 1 tablet (25 mg total) by mouth daily. 90 tablet 3   No current facility-administered medications for this encounter.   No Known Allergies  Social History   Socioeconomic History   Marital status: Single    Spouse name: Not on file   Number of children: Not on file   Years of education: Not on file   Highest education level: Not on file  Occupational History   Not on file  Tobacco Use   Smoking status: Never   Smokeless tobacco: Never  Substance and Sexual Activity   Alcohol use: No   Drug use: No   Sexual activity: Yes  Other Topics Concern   Not on file  Social History Narrative   Not on file   Social Determinants of Health   Financial Resource Strain: Not on file  Food Insecurity: Not on file  Transportation Needs: Not on file  Physical Activity: Not on file  Stress: Not on file  Social Connections: Not on file  Intimate Partner Violence: Not on file   Family History  Problem Relation Age of Onset   Heart attack Father    Diabetes Mellitus II Neg Hx    CAD Neg Hx    BP 120/70   Pulse 70   Wt 92.8 kg (204 lb 9.6 oz)   SpO2 97%   BMI 27.75 kg/m   Wt Readings from Last 3 Encounters:  08/21/22 92.8 kg (204 lb 9.6 oz)  08/08/22 93 kg (205 lb)  07/18/22 94.1 kg (207 lb 6.4 oz)   PHYSICAL EXAM: General: NAD Neck: No JVD, no thyromegaly or thyroid nodule.  Lungs: Clear to auscultation bilaterally with normal respiratory effort. CV: Nondisplaced PMI.  Heart regular S1/S2, no S3/S4, 1/6 HSM LLSB/apex.  No  peripheral edema.  No carotid bruit.  Normal pedal pulses.  Abdomen: Soft, nontender, no hepatosplenomegaly, no distention.  Skin: Intact without lesions or rashes.  Neurologic: Alert and oriented x 3.  Psych: Normal affect. Extremities: No clubbing or cyanosis.  HEENT: Normal.   ASSESSMENT & PLAN: 1. Chronic systolic CHF: Echo in 0/09 with EF 15-20% range with mildly dilated LV, moderately dilated RV with severely decreased systolic function, moderate-severe functional mitral regurgitation, dilated IVC.  Does not have history of ETOH or drugs.  No strong FH of cardiomyopathy.  He was noted in 5/23 hospitalization to have frequent PVCs, may contribute to CMP.  RHC in 5/23 showed elevated filling pressures and low cardiac output (CI 1.89). LHC showed moderate LAD disease, however cardiomyopathy was out of proportion to degree of CAD. 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 Echo 8/23 showed some improvement,  EF up to 30-35%, despite being out of his meds x 2 weeks prior.  Cardiac MRI in 11/23 showed further improvement in LV EF to 49% with moderate LVH, RV EF 47%, severe MR by regurgitant fraction (41%) but not visually, noncoronary LGE noted in the mid-wall basal septum, mid-wall basal-mid inferior wall, mid-wall basal anterolateral wall.  MRI concerning for possible prior myocarditis or cardiac sarcoidosis, but pattern could also be seen with a variant of hypertrophic cardiomyopathy given the moderate LVH. On exam, he is not volume overloaded.  NYHA class II symptoms.  - Continue Entresto 97/103 mg bid. BMET today. - He does not need Lasix.  - Increase Coreg to 12.5 mg bid.  - Stop digoxin with improved LV EF.  - Continue dapagliflozin 10 daily.   - Continue hydralazine 50 mg tid + Imdur 30 mg daily. - Continue spironolactone 25 mg daily.  - Narrow QRS, would not be CRT candidate. EF is now out of ICD range.  - I will send [facility] gene testing to assess for common gene mutations associated  with cardiomyopathies and also to look for hypertrophic cardiomyopathy mutations.  2. CAD: Mod LAD and Diag disease on 5/23 cath. The proximal LAD lesion is not critical but looks to be around 70% (moderate stenosis).  This could potentially be intervened upon, as could the 1st diagonal (would not intervene on the severe far distal LAD stenosis).  However, patient reports no chest pain and his cardiomyopathy is diffuse and not explained by the moderate LAD disease. Would manage medically for now unless he develops chest pain.  - Continue ASA + statin, check lipids next appt.    3. CKD stage 3: suspect baseline diabetic nephropathy. BMET today. - Continue Farxiga.  4. Diabetes: Type 2. He is not on insulin. 5. HTN: BP controlled.  - Continue GDMT as above. 6. Mitral regurgitation: Mod-severe functional MR on 5/23 echo, echo 8/23 showed moderate MR.  Cardiac MRI in 11/23 did not appear to show significant MR visually but calculated regurgitant fraction was 41%. He does not have a loud murmur.  - I think he needs at TEE to sort out severity and etiology of mitral regurgitation.  We discussed risks/benefits and he agrees to procedure.   7. PVCs: Frequent during 5/23 admission. Zio monitor in 11/23 (on mexiletine) showed 7.7% PVCs.  - Continue mexiletine 150 mg bid.  - Coreg to be increased again today.    Follow up 3 months with APP.   Loralie Champagne  08/21/2022

## 2022-08-27 ENCOUNTER — Other Ambulatory Visit: Payer: Self-pay

## 2022-08-27 ENCOUNTER — Telehealth (HOSPITAL_COMMUNITY): Payer: Self-pay | Admitting: *Deleted

## 2022-08-29 ENCOUNTER — Other Ambulatory Visit (HOSPITAL_COMMUNITY): Payer: Self-pay

## 2022-09-05 ENCOUNTER — Encounter (HOSPITAL_COMMUNITY): Payer: Self-pay | Admitting: Anesthesiology

## 2022-09-06 ENCOUNTER — Ambulatory Visit (HOSPITAL_COMMUNITY): Admission: RE | Admit: 2022-09-06 | Payer: Medicaid Other | Source: Home / Self Care | Admitting: Cardiology

## 2022-09-06 ENCOUNTER — Ambulatory Visit (HOSPITAL_COMMUNITY): Payer: Medicaid Other | Attending: Cardiology

## 2022-09-06 ENCOUNTER — Other Ambulatory Visit (HOSPITAL_COMMUNITY): Payer: Medicaid Other

## 2022-09-06 ENCOUNTER — Encounter (HOSPITAL_COMMUNITY): Admission: RE | Payer: Self-pay | Source: Home / Self Care

## 2022-09-06 ENCOUNTER — Telehealth (HOSPITAL_COMMUNITY): Payer: Self-pay | Admitting: Emergency Medicine

## 2022-09-06 SURGERY — ECHOCARDIOGRAM, TRANSESOPHAGEAL
Anesthesia: Monitor Anesthesia Care

## 2022-09-06 NOTE — Telephone Encounter (Signed)
Called Mr. Eric Larson to schedule a visit.  Unable to leave voice mail.    [name], [place] 09/06/2022

## 2022-09-12 ENCOUNTER — Telehealth (HOSPITAL_COMMUNITY): Payer: Self-pay | Admitting: Emergency Medicine

## 2022-09-12 NOTE — Telephone Encounter (Signed)
Scheduled home visit with Eric Larson for to morning @ 9:00. He said he missed his ECHO appointment 09/06/22 and would call the clinic to try and reschedule.      [name], [place] 09/12/2022

## 2022-09-13 ENCOUNTER — Other Ambulatory Visit (HOSPITAL_COMMUNITY): Payer: Self-pay | Admitting: Emergency Medicine

## 2022-09-13 NOTE — Progress Notes (Signed)
Paramedicine Encounter    Patient ID: Eric Larson, male    DOB: [birth year], 62 y.o.   MRN: [id number]   Complaints None   Assessment A&O x 4, skin W&D w/ good color.  No chest pain, SOB and no edema  Compliance with meds YES  Pill box filled NO   Refills needed none needed per pt.  Meds changes since last visit  Stopped Dig and increased Carvedilol 12.5 BID   Social changes: None   BP 130/80 (BP Location: Right Arm, Patient Position: Sitting, Cuff Size: Normal)   Pulse 78   Resp 14   Wt 205 lb (93 kg)   SpO2 97%   BMI 27.80 kg/m  Weight yesterday-not taken Last visit weight-204lb  Mr. Frankowski is looking well today.  He has not complaints.  Medications reviewed and he advises he is compliant with same.  He missed his ECHO appointment 09/06/22.  He states no one from the clinic called to remind him.  He says he ususally gets several reminder texts.  He advised he would call later today to reschedule this procedure.    ACTION: Home visit completed  Skipper Cliche [phone] 09/13/22  Patient Care Team: [name], NP as PCP - General [name], MD as PCP - Advanced Heart Failure (Cardiology)  Patient Active Problem List   Diagnosis Date Noted   Chronic systolic heart failure ([place]) 01/18/2022   Acute heart failure ([place]) 01/07/2022   Essential hypertension 01/07/2022   Diabetes mellitus ([place]) 10/22/2013   Hypothyroidism 10/08/2013   AKI (acute kidney injury) ([place]) 10/07/2013   Hyperglycemia without ketosis 10/06/2013   DKA (diabetic ketoacidoses) 10/06/2013   Elevated blood pressure 10/06/2013   History of gout 10/06/2013   History of goiter 10/06/2013   Type 2 diabetes mellitus with hyperosmolar nonketotic hyperglycemia ([place]) 10/06/2013   CKD (chronic kidney disease) stage 3, GFR 30-59 ml/min (HCC) 10/06/2013   Obesity 10/06/2013   GERD (gastroesophageal reflux disease) 10/06/2013    Current Outpatient Medications:    aspirin EC  81 MG tablet, Take 1 tablet (81 mg total) by mouth daily. Swallow whole., Disp: 30 tablet, Rfl:    Blood Glucose Monitoring Suppl (TRUE METRIX METER) W/DEVICE KIT, 1 each by Does not apply route 3 (three) times daily., Disp: 1 kit, Rfl: 0   carvedilol (COREG) 12.5 MG tablet, Take 1 tablet (12.5 mg total) by mouth 2 (two) times daily., Disp: 180 tablet, Rfl: 3   dapagliflozin propanediol (FARXIGA) 10 MG TABS tablet, Take 1 tablet (10 mg total) by mouth daily., Disp: 90 tablet, Rfl: 3   glucose blood test strip, Use as instructed, Disp: 100 each, Rfl: 12   hydrALAZINE (APRESOLINE) 50 MG tablet, Take 1 tablet (50 mg total) by mouth 3 (three) times daily., Disp: 270 tablet, Rfl: 3   isosorbide mononitrate (IMDUR) 30 MG 24 hr tablet, Take 1 tablet (30 mg total) by mouth daily., Disp: 90 tablet, Rfl: 3   levothyroxine (SYNTHROID) 75 MCG tablet, Take 1 tablet (75 mcg total) by mouth daily., Disp: 30 tablet, Rfl: 3   metFORMIN (GLUCOPHAGE) 1000 MG tablet, Take 1,000 mg by mouth 2 (two) times daily with a meal., Disp: , Rfl:    mexiletine (MEXITIL) 150 MG capsule, Take 1 capsule (150 mg total) by mouth every 12 (twelve) hours. NEED OV., Disp: 180 capsule, Rfl: 0   rosuvastatin (CRESTOR) 20 MG tablet, Take 1 tablet (20 mg total) by mouth daily., Disp: 90 tablet, Rfl: 3   spironolactone (  ALDACTONE) 25 MG tablet, Take 1 tablet (25 mg total) by mouth daily., Disp: 90 tablet, Rfl: 3   sacubitril-valsartan (ENTRESTO) 97-103 MG, Take 1 tablet by mouth 2 (two) times daily., Disp: 60 tablet, Rfl: 11 No Known Allergies   Social History   Socioeconomic History   Marital status: Single    Spouse name: Not on file   Number of children: Not on file   Years of education: Not on file   Highest education level: Not on file  Occupational History   Not on file  Tobacco Use   Smoking status: Never   Smokeless tobacco: Never  Substance and Sexual Activity   Alcohol use: No   Drug use: No   Sexual activity: Yes   Other Topics Concern   Not on file  Social History Narrative   Not on file   Social Determinants of Health   Financial Resource Strain: Not on file  Food Insecurity: Not on file  Transportation Needs: Not on file  Physical Activity: Not on file  Stress: Not on file  Social Connections: Not on file  Intimate Partner Violence: Not on file    Physical Exam      Future Appointments  Date Time Provider [place]  11/12/2022  9:00 AM [facility] PA/NP [facility] None

## 2022-09-17 ENCOUNTER — Other Ambulatory Visit (HOSPITAL_COMMUNITY): Payer: Self-pay

## 2022-09-18 ENCOUNTER — Other Ambulatory Visit (HOSPITAL_COMMUNITY): Payer: Self-pay

## 2022-09-18 ENCOUNTER — Telehealth (HOSPITAL_COMMUNITY): Payer: Self-pay | Admitting: Emergency Medicine

## 2022-09-18 NOTE — Telephone Encounter (Signed)
Called and left message regarding refills.  Carvedilol and Arlyce Harman will be ready after 11:00 today.  He needs to check and see if he has another bottle of Wilder Glade somewhere-it was last filled 07/18/22 for 90 day supply.  Advised him to reach out should he need further.    [name], [place] 09/18/2022

## 2022-09-18 NOTE — Telephone Encounter (Signed)
Called for refills for Mr. Eric Larson - Carvedilol, Farxiga and Spironolactone.  Carvedilol and Eric Larson will be ready after 11:00 today.  Eric Larson was last filled 07/08/22 90 day supply so too soon to refill.  Will contact pt. And advise of same.    [name], [place] 09/18/2022

## 2022-09-24 ENCOUNTER — Other Ambulatory Visit (HOSPITAL_COMMUNITY): Payer: Self-pay | Admitting: Cardiology

## 2022-09-26 ENCOUNTER — Other Ambulatory Visit (HOSPITAL_COMMUNITY): Payer: Self-pay

## 2022-09-27 ENCOUNTER — Other Ambulatory Visit (HOSPITAL_COMMUNITY): Payer: Self-pay

## 2022-09-27 ENCOUNTER — Other Ambulatory Visit: Payer: Self-pay | Admitting: Physician Assistant

## 2022-09-27 ENCOUNTER — Other Ambulatory Visit (HOSPITAL_COMMUNITY): Payer: Self-pay | Admitting: Emergency Medicine

## 2022-09-27 NOTE — Progress Notes (Signed)
No show for appointment    [name], [place] 09/27/2022

## 2022-09-30 ENCOUNTER — Other Ambulatory Visit (HOSPITAL_COMMUNITY): Payer: Self-pay

## 2022-09-30 MED ORDER — MEXILETINE HCL 150 MG PO CAPS
150.0000 mg | ORAL_CAPSULE | Freq: Two times a day (BID) | ORAL | 0 refills | Status: DC
  Filled 2022-09-30: qty 180, 90d supply, fill #0

## 2022-10-01 ENCOUNTER — Other Ambulatory Visit (HOSPITAL_COMMUNITY): Payer: Self-pay

## 2022-10-02 ENCOUNTER — Other Ambulatory Visit (HOSPITAL_COMMUNITY): Payer: Self-pay

## 2022-10-02 DIAGNOSIS — Z006 Encounter for examination for normal comparison and control in clinical research program: Secondary | ICD-10-CM

## 2022-10-02 NOTE — Research (Signed)
Called pt to check on compliance reports. Pt states he has not been doing recordings because he has not received any checks in the mail. Confirmed address and notified billing. Pt verbalized understanding and will restart recordings.

## 2022-10-03 ENCOUNTER — Other Ambulatory Visit (HOSPITAL_COMMUNITY): Payer: Self-pay | Admitting: Emergency Medicine

## 2022-10-03 ENCOUNTER — Telehealth (HOSPITAL_COMMUNITY): Payer: Self-pay | Admitting: *Deleted

## 2022-10-03 NOTE — Telephone Encounter (Signed)
Per Eric Larson with paramedicine pt needs to r/s his TEE from 1/5 his auth expires 2/23.  Routed to [employer]

## 2022-10-03 NOTE — Progress Notes (Signed)
Paramedicine Encounter    Patient ID: Eric Larson, male    DOB: Mar 13, 1961, 62 y.o.   MRN: [id number]   Complaints None  Assessment No chest pain, SOB, no edema  Compliance with meds Yes  Pill box filled reconciled by his friend [name]  Refills needed none  Meds changes since last visit none    Social changes none   BP 120/78 (BP Location: Left Arm, Patient Position: Sitting)   Pulse 67   Resp 16   Wt 205 lb (93 kg)   SpO2 97%   BMI 27.80 kg/m  Weight yesterday-not taken Last visit weight-205lb   [name] states he desires to get his TEE rescheduled.  I spoke with [name] in HF Triage and she states she will pass the information along and call [name].  Also, [name] voices that somehow his PCP got changed on his [facility].  I passed this information to [place] and she advised she would call him to give him instruction on how to correct this problem. 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  [name], [place] 10/03/22  Patient Care Team: [name], NP as PCP - General [name], MD as PCP - Advanced Heart Failure (Cardiology)  Patient Active Problem List   Diagnosis Date Noted   Chronic systolic heart failure ([place]) 01/18/2022   Acute heart failure ([place]) 01/07/2022   Essential hypertension 01/07/2022   Diabetes mellitus ([place]) 10/22/2013   Hypothyroidism 10/08/2013   AKI (acute kidney injury) ([place]) 10/07/2013   Hyperglycemia without ketosis 10/06/2013   DKA (diabetic ketoacidoses) 10/06/2013   Elevated blood pressure 10/06/2013   History of gout 10/06/2013   History of goiter 10/06/2013   Type 2 diabetes mellitus with hyperosmolar nonketotic hyperglycemia ([place]) 10/06/2013   CKD (chronic kidney disease) stage 3, GFR 30-59 ml/min (HCC) 10/06/2013   Obesity 10/06/2013   GERD (gastroesophageal reflux disease) 10/06/2013    Current Outpatient Medications:    aspirin EC 81 MG  tablet, Take 1 tablet (81 mg total) by mouth daily. Swallow whole., Disp: 30 tablet, Rfl:    Blood Glucose Monitoring Suppl (TRUE METRIX METER) W/DEVICE KIT, 1 each by Does not apply route 3 (three) times daily., Disp: 1 kit, Rfl: 0   carvedilol (COREG) 12.5 MG tablet, Take 1 tablet (12.5 mg total) by mouth 2 (two) times daily., Disp: 180 tablet, Rfl: 3   dapagliflozin propanediol (FARXIGA) 10 MG TABS tablet, Take 1 tablet (10 mg total) by mouth daily., Disp: 90 tablet, Rfl: 3   glucose blood test strip, Use as instructed, Disp: 100 each, Rfl: 12   hydrALAZINE (APRESOLINE) 50 MG tablet, Take 1 tablet (50 mg total) by mouth 3 (three) times daily., Disp: 270 tablet, Rfl: 3   isosorbide mononitrate (IMDUR) 30 MG 24 hr tablet, Take 1 tablet (30 mg total) by mouth daily., Disp: 90 tablet, Rfl: 3   levothyroxine (SYNTHROID) 75 MCG tablet, Take 1 tablet (75 mcg total) by mouth daily., Disp: 30 tablet, Rfl: 3   metFORMIN (GLUCOPHAGE) 1000 MG tablet, Take 1,000 mg by mouth 2 (two) times daily with a meal., Disp: , Rfl:    mexiletine (MEXITIL) 150 MG capsule, Take 1 capsule (150 mg total) by mouth every 12 (twelve) hours. NEED OV., Disp: 180 capsule, Rfl: 0   rosuvastatin (CRESTOR) 20 MG tablet, Take 1 tablet (20 mg total) by mouth daily., Disp: 90 tablet, Rfl: 3   sacubitril-valsartan (ENTRESTO) 97-103 MG, Take 1 tablet by mouth  2 (two) times daily., Disp: 60 tablet, Rfl: 11   spironolactone (ALDACTONE) 25 MG tablet, Take 1 tablet (25 mg total) by mouth daily., Disp: 90 tablet, Rfl: 3 No Known Allergies   Social History   Socioeconomic History   Marital status: Single    Spouse name: Not on file   Number of children: Not on file   Years of education: Not on file   Highest education level: Not on file  Occupational History   Not on file  Tobacco Use   Smoking status: Never   Smokeless tobacco: Never  Substance and Sexual Activity   Alcohol use: No   Drug use: No   Sexual activity: Yes  Other  Topics Concern   Not on file  Social History Narrative   Not on file   Social Determinants of Health   Financial Resource Strain: Not on file  Food Insecurity: Not on file  Transportation Needs: Not on file  Physical Activity: Not on file  Stress: Not on file  Social Connections: Not on file  Intimate Partner Violence: Not on file    Physical Exam      Future Appointments  Date Time Provider [place]  11/12/2022  9:00 AM [facility] PA/NP [facility] None        205lb  Cbg 185 Patient is now discharged from [facility].  Patient has/has not met the following goals:  Yes :Patient expresses basic understanding of medications and what they are for Yes :Patient able to verbalize heart failure specific dietary/fluid restrictions Yes :Patient is aware of who to call if they have medical concerns or if they need to schedule or change appts Yes :Patient has a scale for daily weights and weighs regularly Yes :Patient able to verbalize concerning symptoms when they should call the HF clinic (weight gain ranges, etc) Yes :Patient has a PCP and has seen within the past year or has upcoming appt Yes :Patient has reliable access to getting their medications Yes :Patient has shown they are able to reorder medications reliably Yes :Patient has had admission in past 30 days- if yes how many? No :Patient has had admission in past 90 days- if yes how many?  Discharge Comments: 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  I encouraged him to reach out should he have further needs.  I provided him with a laminated clinic card with phone numbers for the clinic and his primary care doctors.

## 2022-10-04 ENCOUNTER — Telehealth (HOSPITAL_COMMUNITY): Payer: Self-pay

## 2022-10-04 NOTE — Telephone Encounter (Signed)
Left message on cell to call office to reschedule TEE. Procedure needs to be done by 10/25/2022 Has prior authorizaton to that date.

## 2022-10-10 NOTE — Progress Notes (Signed)
Patient is now discharged from [facility].  Patient has/has not met the following goals:  Yes :Patient expresses basic understanding of medications and what they are for Yes :Patient able to verbalize heart failure specific dietary/fluid restrictions Yes :Patient is aware of who to call if they have medical concerns or if they need to schedule or change appts Yes :Patient has a scale for daily weights and weighs regularly Yes :Patient able to verbalize concerning symptoms when they should call the HF clinic (weight gain ranges, etc) Yes :Patient has a PCP and has seen within the past year or has upcoming appt Yes :Patient has reliable access to getting their medications Yes :Patient has shown they are able to reorder medications reliably No :Patient has had admission in past 30 days- if yes how many? No :Patient has had admission in past 90 days- if yes how many?  Discharge Comments: Eric Larson is graduating after demonstrating ability to maintain a strong foundation of care for himself.  He has a good support system in his home and his friend Eric Larson is a strong resource for him and fills a monthly pillbox for him.  Provided him with discharge card w/ contact information to the clinic and PCP.  Advised him to reach out to me should he need assistance or have concerns.    [name], [place] 10/10/2022

## 2022-10-24 ENCOUNTER — Other Ambulatory Visit (HOSPITAL_COMMUNITY): Payer: Self-pay

## 2022-10-25 ENCOUNTER — Other Ambulatory Visit (HOSPITAL_COMMUNITY): Payer: Self-pay

## 2022-10-28 ENCOUNTER — Other Ambulatory Visit (HOSPITAL_COMMUNITY): Payer: Self-pay

## 2022-10-31 DIAGNOSIS — Z006 Encounter for examination for normal comparison and control in clinical research program: Secondary | ICD-10-CM

## 2022-10-31 NOTE — Research (Signed)
Called pt regarding compliance with Detect HF study. Pt did not answer, LVM to call back office.

## 2022-11-07 DIAGNOSIS — K219 Gastro-esophageal reflux disease without esophagitis: Secondary | ICD-10-CM | POA: Diagnosis not present

## 2022-11-07 DIAGNOSIS — E559 Vitamin D deficiency, unspecified: Secondary | ICD-10-CM | POA: Diagnosis not present

## 2022-11-07 DIAGNOSIS — I10 Essential (primary) hypertension: Secondary | ICD-10-CM | POA: Diagnosis not present

## 2022-11-07 DIAGNOSIS — E038 Other specified hypothyroidism: Secondary | ICD-10-CM | POA: Diagnosis not present

## 2022-11-07 DIAGNOSIS — D518 Other vitamin B12 deficiency anemias: Secondary | ICD-10-CM | POA: Diagnosis not present

## 2022-11-07 DIAGNOSIS — E1165 Type 2 diabetes mellitus with hyperglycemia: Secondary | ICD-10-CM | POA: Diagnosis not present

## 2022-11-07 DIAGNOSIS — E782 Mixed hyperlipidemia: Secondary | ICD-10-CM | POA: Diagnosis not present

## 2022-11-07 DIAGNOSIS — I5021 Acute systolic (congestive) heart failure: Secondary | ICD-10-CM | POA: Diagnosis not present

## 2022-11-07 DIAGNOSIS — Z0000 Encounter for general adult medical examination without abnormal findings: Secondary | ICD-10-CM | POA: Diagnosis not present

## 2022-11-07 DIAGNOSIS — M109 Gout, unspecified: Secondary | ICD-10-CM | POA: Diagnosis not present

## 2022-11-11 NOTE — H&P (View-Only) (Signed)
 ADVANCED HF CLINIC NOTE  Primary Care: George, Robert E, NP HF Cardiologist: Dr. 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  Admitted 5/23 with new acute heart failure. Given IV lasix. Echo showed EF 15-20% range with mildly dilated LV, moderately dilated RV with severely decreased systolic function, moderate-severe functional mitral regurgitation, dilated IVC. Underwent R/LHC showing mildly elevated filling pressures, low CO and 70% proximal LAD stenosis, 90% distal LAD stenosis,  80% stenosis proximally in a moderate D1. Films reviewed with Dr. Jordan and decided not to intervene in the absence of chest pain and presence of diffuse cardiomyopathy, out of proportion to known disease. Started on milrinone and continued with diuresis. Drips weaned off and GDMT titrated. Started on mexiletine with frequent PVCs. Discharged home, weight 201 lbs.  Echo 8/23 showed EF 30-35%, mild LV dilation, moderate RV dysfunction with moderate MR, IVC normal.   Cardiac MRI in 11/23 showed LV EF 49% with moderate LVH, RV EF 47%, severe MR by regurgitant fraction (41%) but not visually, noncoronary LGE noted in the mid-wall basal septum, mid-wall basal-mid inferior wall, mid-wall basal anterolateral wall.   Zio monitor (11/23) showed 7.7% PVCs, Coreg was increased.   Today he returns for HF follow up with paramedic, DeDe. Weight down 3 lbs. He is doing well symptomatically.  No exertional dyspnea or chest pain.  No lightheadedness. He is tolerating all his meds.  No palpitations.  No orthopnea/PND.    ECG (personally reviewed): NSR, anterolateral TWIs  Labs (5/23): K 4.4, creatinine 1.82, hgb 12.5 Labs (7/23): K 4.1, creatinine 1.56 Labs (8/23): K 4.1, creatinine 1.28 Labs (10/23): K 4.2, creatinine 1.59 Labs (11/23): NT-pro-BNP 319, K 3.6, creatinine 1.28  PMH: 1. 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   - Echo (5/23): EF 15-20%, moderately dilated RV with severely decreased systolic function, moderate-severe functional mitral regurgitation.  - RHC/LHC (5/23): Mean RA 11, PA 40/21, mean PCWP 23, CI 1.89; 70% proximal LAD stenosis, 90% distal LAD stenosis,  80% stenosis proximally in a moderate D1.  - Echo (8/23): EF 30-35%, mild LV dilation, moderate RV dysfunction with moderate MR, IVC normal.  - Cardiac MRI in 11/23 showed LV EF 49% with moderate LVH, RV EF 47%, severe MR by regurgitant fraction (41%) but not visually, noncoronary LGE noted in the mid-wall basal septum, mid-wall basal-mid inferior wall, mid-wall basal anterolateral wall.  2. CAD: LHC (5/23) with 70% proximal LAD stenosis, 90% distal LAD stenosis,  80% stenosis proximally in a moderate D1. Managed medically.  3. HTN 4. DM2 5. 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 Gout 7. Hypothyroidism 8. PVCs: Zio monitor 11/23 with 7.7% PVCs.   Review of Systems: All systems reviewed and negative except as per HPI.   Current Outpatient Medications  Medication Sig Dispense Refill   aspirin EC 81 MG tablet Take 1 tablet (81 mg total) by mouth daily. Swallow whole. 30 tablet    Blood Glucose Monitoring Suppl (TRUE METRIX METER) W/DEVICE KIT 1 each by Does not apply route 3 (three) times daily. 1 kit 0   carvedilol (COREG) 12.5 MG tablet Take 1 tablet (12.5 mg total) by mouth 2 (two) times daily. 180 tablet 3   dapagliflozin propanediol (FARXIGA) 10 MG TABS tablet Take 1 tablet (10 mg total) by mouth daily. 90 tablet 3   glucose blood test strip Use as instructed 100 each 12     hydrALAZINE (APRESOLINE) 50 MG tablet Take 1 tablet (50 mg total) by mouth 3 (three) times daily. 270 tablet 3   isosorbide mononitrate (IMDUR) 30 MG 24 hr tablet Take 1 tablet (30 mg total) by mouth daily. 90 tablet 3   levothyroxine (SYNTHROID) 75 MCG tablet Take 1 tablet (75 mcg total) by mouth daily. 30 tablet 3    metFORMIN (GLUCOPHAGE) 1000 MG tablet Take 1,000 mg by mouth 2 (two) times daily with a meal.     mexiletine (MEXITIL) 150 MG capsule Take 1 capsule (150 mg total) by mouth every 12 (twelve) hours. NEED OV. 180 capsule 0   rosuvastatin (CRESTOR) 20 MG tablet Take 1 tablet (20 mg total) by mouth daily. 90 tablet 3   sacubitril-valsartan (ENTRESTO) 97-103 MG Take 1 tablet by mouth 2 (two) times daily. 60 tablet 11   spironolactone (ALDACTONE) 25 MG tablet Take 1 tablet (25 mg total) by mouth daily. 90 tablet 3   No current facility-administered medications for this visit.   No Known Allergies  Social History   Socioeconomic History   Marital status: Single    Spouse name: Not on file   Number of children: Not on file   Years of education: Not on file   Highest education level: Not on file  Occupational History   Not on file  Tobacco Use   Smoking status: Never   Smokeless tobacco: Never  Substance and Sexual Activity   Alcohol use: No   Drug use: No   Sexual activity: Yes  Other Topics Concern   Not on file  Social History Narrative   Not on file   Social Determinants of Health   Financial Resource Strain: Not on file  Food Insecurity: Not on file  Transportation Needs: Not on file  Physical Activity: Not on file  Stress: Not on file  Social Connections: Not on file  Intimate Partner Violence: Not on file   Family History  Problem Relation Age of Onset   Heart attack Father    Diabetes Mellitus II Neg Hx    CAD Neg Hx    There were no vitals taken for this visit.  Wt Readings from Last 3 Encounters:  10/03/22 93 kg (205 lb)  09/13/22 93 kg (205 lb)  08/21/22 92.8 kg (204 lb 9.6 oz)   PHYSICAL EXAM: General: NAD Neck: No JVD, no thyromegaly or thyroid nodule.  Lungs: Clear to auscultation bilaterally with normal respiratory effort. CV: Nondisplaced PMI.  Heart regular S1/S2, no S3/S4, 1/6 HSM LLSB/apex.  No peripheral edema.  No carotid bruit.  Normal pedal  pulses.  Abdomen: Soft, nontender, no hepatosplenomegaly, no distention.  Skin: Intact without lesions or rashes.  Neurologic: Alert and oriented x 3.  Psych: Normal affect. Extremities: No clubbing or cyanosis.  HEENT: Normal.   ASSESSMENT & PLAN: 1. Chronic systolic CHF: Echo in 5/23 with EF 15-20% range with mildly dilated LV, moderately dilated RV with severely decreased systolic function, moderate-severe functional mitral regurgitation, dilated IVC.  Does not have history of ETOH or drugs.  No strong FH of cardiomyopathy.  He was noted in 5/23 hospitalization to have frequent PVCs, may contribute to CMP.  RHC in 5/23 showed elevated filling pressures and low cardiac output (CI 1.89). LHC showed moderate LAD disease, however cardiomyopathy was out of proportion to degree of CAD. 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 Echo 8/23 showed some improvement, EF up to 30-35%, despite being out of his meds x 2 weeks prior.  Cardiac MRI in   11/23 showed further improvement in LV EF to 49% with moderate LVH, RV EF 47%, severe MR by regurgitant fraction (41%) but not visually, noncoronary LGE noted in the mid-wall basal septum, mid-wall basal-mid inferior wall, mid-wall basal anterolateral wall.  MRI concerning for possible prior myocarditis or cardiac sarcoidosis, but pattern could also be seen with a variant of hypertrophic cardiomyopathy given the moderate LVH. On exam, he is not volume overloaded.  NYHA class II symptoms.  - Continue Entresto 97/103 mg bid. BMET today. - He does not need Lasix.  - Increase Coreg to 12.5 mg bid.  - Stop digoxin with improved LV EF.  - Continue dapagliflozin 10 daily.   - Continue hydralazine 50 mg tid + Imdur 30 mg daily. - Continue spironolactone 25 mg daily.  - Narrow QRS, would not be CRT candidate. EF is now out of ICD range.  - I will send [employer] gene testing to assess for common gene mutations associated with cardiomyopathies and also to look for  hypertrophic cardiomyopathy mutations.  2. CAD: Mod LAD and Diag disease on 5/23 cath. The proximal LAD lesion is not critical but looks to be around 70% (moderate stenosis).  This could potentially be intervened upon, as could the 1st diagonal (would not intervene on the severe far distal LAD stenosis).  However, patient reports no chest pain and his cardiomyopathy is diffuse and not explained by the moderate LAD disease. Would manage medically for now unless he develops chest pain.  - Continue ASA + statin, check lipids next appt.    3. CKD stage 3: suspect baseline diabetic nephropathy. BMET today. - Continue Farxiga.  4. Diabetes: Type 2. He is not on insulin. 5. HTN: BP controlled.  - Continue GDMT as above. 6. Mitral regurgitation: Mod-severe functional MR on 5/23 echo, echo 8/23 showed moderate MR.  Cardiac MRI in 11/23 did not appear to show significant MR visually but calculated regurgitant fraction was 41%. He does not have a loud murmur.  - I think he needs at TEE to sort out severity and etiology of mitral regurgitation.  We discussed risks/benefits and he agrees to procedure.   7. PVCs: Frequent during 5/23 admission. Zio monitor in 11/23 (on mexiletine) showed 7.7% PVCs.  - Continue mexiletine 150 mg bid.  - Coreg to be increased again today.    Follow up 3 months with APP.   Nabilah Davoli M Daena Alper  11/11/2022 

## 2022-11-11 NOTE — Progress Notes (Signed)
ADVANCED HF CLINIC NOTE  Primary Care: Hortencia Conradi, NP HF Cardiologist: Dr. 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  Admitted 5/23 with new acute heart failure. Given IV lasix. Echo showed EF 15-20% range with mildly dilated LV, moderately dilated RV with severely decreased systolic function, moderate-severe functional mitral regurgitation, dilated IVC. Underwent R/LHC showing mildly elevated filling pressures, low CO and 70% proximal LAD stenosis, 90% distal LAD stenosis,  80% stenosis proximally in a moderate D1. Films reviewed with Dr. [place] and decided not to intervene in the absence of chest pain and presence of diffuse cardiomyopathy, out of proportion to known disease. Started on milrinone and continued with diuresis. Drips weaned off and GDMT titrated. Started on mexiletine with frequent PVCs. Discharged home, weight 201 lbs.  Echo 8/23 showed EF 30-35%, mild LV dilation, moderate RV dysfunction with moderate MR, IVC normal.   Cardiac MRI in 11/23 showed LV EF 49% with moderate LVH, RV EF 47%, severe MR by regurgitant fraction (41%) but not visually, noncoronary LGE noted in the mid-wall basal septum, mid-wall basal-mid inferior wall, mid-wall basal anterolateral wall.   Zio monitor (11/23) showed 7.7% PVCs, Coreg was increased.   Today he returns for HF follow up with paramedic, DeDe. Weight down 3 lbs. He is doing well symptomatically.  No exertional dyspnea or chest pain.  No lightheadedness. He is tolerating all his meds.  No palpitations.  No orthopnea/PND.    ECG (personally reviewed): NSR, anterolateral TWIs  Labs (5/23): K 4.4, creatinine 1.82, hgb 12.5 Labs (7/23): K 4.1, creatinine 1.56 Labs (8/23): K 4.1, creatinine 1.28 Labs (10/23): K 4.2, creatinine 1.59 Labs (11/23): NT-pro-BNP 319, K 3.6, creatinine 1.28  PMH: 1. 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   - Echo (5/23): EF 15-20%, moderately dilated RV with severely decreased systolic function, moderate-severe functional mitral regurgitation.  - RHC/LHC (5/23): Mean RA 11, PA 40/21, mean PCWP 23, CI 1.89; 70% proximal LAD stenosis, 90% distal LAD stenosis,  80% stenosis proximally in a moderate D1.  - Echo (8/23): EF 30-35%, mild LV dilation, moderate RV dysfunction with moderate MR, IVC normal.  - Cardiac MRI in 11/23 showed LV EF 49% with moderate LVH, RV EF 47%, severe MR by regurgitant fraction (41%) but not visually, noncoronary LGE noted in the mid-wall basal septum, mid-wall basal-mid inferior wall, mid-wall basal anterolateral wall.  2. CAD: LHC (5/23) with 70% proximal LAD stenosis, 90% distal LAD stenosis,  80% stenosis proximally in a moderate D1. Managed medically.  3. HTN 4. DM2 5. 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 Gout 7. Hypothyroidism 8. PVCs: Zio monitor 11/23 with 7.7% PVCs.   Review of Systems: All systems reviewed and negative except as per HPI.   Current Outpatient Medications  Medication Sig Dispense Refill   aspirin EC 81 MG tablet Take 1 tablet (81 mg total) by mouth daily. Swallow whole. 30 tablet    Blood Glucose Monitoring Suppl (TRUE METRIX METER) W/DEVICE KIT 1 each by Does not apply route 3 (three) times daily. 1 kit 0   carvedilol (COREG) 12.5 MG tablet Take 1 tablet (12.5 mg total) by mouth 2 (two) times daily. 180 tablet 3   dapagliflozin propanediol (FARXIGA) 10 MG TABS tablet Take 1 tablet (10 mg total) by mouth daily. 90 tablet 3   glucose blood test strip Use as instructed 100 each 12  hydrALAZINE (APRESOLINE) 50 MG tablet Take 1 tablet (50 mg total) by mouth 3 (three) times daily. 270 tablet 3   isosorbide mononitrate (IMDUR) 30 MG 24 hr tablet Take 1 tablet (30 mg total) by mouth daily. 90 tablet 3   levothyroxine (SYNTHROID) 75 MCG tablet Take 1 tablet (75 mcg total) by mouth daily. 30 tablet 3    metFORMIN (GLUCOPHAGE) 1000 MG tablet Take 1,000 mg by mouth 2 (two) times daily with a meal.     mexiletine (MEXITIL) 150 MG capsule Take 1 capsule (150 mg total) by mouth every 12 (twelve) hours. NEED OV. 180 capsule 0   rosuvastatin (CRESTOR) 20 MG tablet Take 1 tablet (20 mg total) by mouth daily. 90 tablet 3   sacubitril-valsartan (ENTRESTO) 97-103 MG Take 1 tablet by mouth 2 (two) times daily. 60 tablet 11   spironolactone (ALDACTONE) 25 MG tablet Take 1 tablet (25 mg total) by mouth daily. 90 tablet 3   No current facility-administered medications for this visit.   No Known Allergies  Social History   Socioeconomic History   Marital status: Single    Spouse name: Not on file   Number of children: Not on file   Years of education: Not on file   Highest education level: Not on file  Occupational History   Not on file  Tobacco Use   Smoking status: Never   Smokeless tobacco: Never  Substance and Sexual Activity   Alcohol use: No   Drug use: No   Sexual activity: Yes  Other Topics Concern   Not on file  Social History Narrative   Not on file   Social Determinants of Health   Financial Resource Strain: Not on file  Food Insecurity: Not on file  Transportation Needs: Not on file  Physical Activity: Not on file  Stress: Not on file  Social Connections: Not on file  Intimate Partner Violence: Not on file   Family History  Problem Relation Age of Onset   Heart attack Father    Diabetes Mellitus II Neg Hx    CAD Neg Hx    There were no vitals taken for this visit.  Wt Readings from Last 3 Encounters:  10/03/22 93 kg (205 lb)  09/13/22 93 kg (205 lb)  08/21/22 92.8 kg (204 lb 9.6 oz)   PHYSICAL EXAM: General: NAD Neck: No JVD, no thyromegaly or thyroid nodule.  Lungs: Clear to auscultation bilaterally with normal respiratory effort. CV: Nondisplaced PMI.  Heart regular S1/S2, no S3/S4, 1/6 HSM LLSB/apex.  No peripheral edema.  No carotid bruit.  Normal pedal  pulses.  Abdomen: Soft, nontender, no hepatosplenomegaly, no distention.  Skin: Intact without lesions or rashes.  Neurologic: Alert and oriented x 3.  Psych: Normal affect. Extremities: No clubbing or cyanosis.  HEENT: Normal.   ASSESSMENT & PLAN: 1. Chronic systolic CHF: Echo in 5/23 with EF 15-20% range with mildly dilated LV, moderately dilated RV with severely decreased systolic function, moderate-severe functional mitral regurgitation, dilated IVC.  Does not have history of ETOH or drugs.  No strong FH of cardiomyopathy.  He was noted in 5/23 hospitalization to have frequent PVCs, may contribute to CMP.  RHC in 5/23 showed elevated filling pressures and low cardiac output (CI 1.89). LHC showed moderate LAD disease, however cardiomyopathy was out of proportion to degree of CAD. 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 Echo 8/23 showed some improvement, EF up to 30-35%, despite being out of his meds x 2 weeks prior.  Cardiac MRI in  11/23 showed further improvement in LV EF to 49% with moderate LVH, RV EF 47%, severe MR by regurgitant fraction (41%) but not visually, noncoronary LGE noted in the mid-wall basal septum, mid-wall basal-mid inferior wall, mid-wall basal anterolateral wall.  MRI concerning for possible prior myocarditis or cardiac sarcoidosis, but pattern could also be seen with a variant of hypertrophic cardiomyopathy given the moderate LVH. On exam, he is not volume overloaded.  NYHA class II symptoms.  - Continue Entresto 97/103 mg bid. BMET today. - He does not need Lasix.  - Increase Coreg to 12.5 mg bid.  - Stop digoxin with improved LV EF.  - Continue dapagliflozin 10 daily.   - Continue hydralazine 50 mg tid + Imdur 30 mg daily. - Continue spironolactone 25 mg daily.  - Narrow QRS, would not be CRT candidate. EF is now out of ICD range.  - I will send [employer] gene testing to assess for common gene mutations associated with cardiomyopathies and also to look for  hypertrophic cardiomyopathy mutations.  2. CAD: Mod LAD and Diag disease on 5/23 cath. The proximal LAD lesion is not critical but looks to be around 70% (moderate stenosis).  This could potentially be intervened upon, as could the 1st diagonal (would not intervene on the severe far distal LAD stenosis).  However, patient reports no chest pain and his cardiomyopathy is diffuse and not explained by the moderate LAD disease. Would manage medically for now unless he develops chest pain.  - Continue ASA + statin, check lipids next appt.    3. CKD stage 3: suspect baseline diabetic nephropathy. BMET today. - Continue Farxiga.  4. Diabetes: Type 2. He is not on insulin. 5. HTN: BP controlled.  - Continue GDMT as above. 6. Mitral regurgitation: Mod-severe functional MR on 5/23 echo, echo 8/23 showed moderate MR.  Cardiac MRI in 11/23 did not appear to show significant MR visually but calculated regurgitant fraction was 41%. He does not have a loud murmur.  - I think he needs at TEE to sort out severity and etiology of mitral regurgitation.  We discussed risks/benefits and he agrees to procedure.   7. PVCs: Frequent during 5/23 admission. Zio monitor in 11/23 (on mexiletine) showed 7.7% PVCs.  - Continue mexiletine 150 mg bid.  - Coreg to be increased again today.    Follow up 3 months with APP.   Anderson Malta [facility]  11/11/2022

## 2022-11-12 ENCOUNTER — Ambulatory Visit (HOSPITAL_COMMUNITY)
Admission: RE | Admit: 2022-11-12 | Discharge: 2022-11-12 | Disposition: A | Discharge status: Home / Self Care | Payer: Medicaid Other | Source: Ambulatory Visit | Attending: Family Medicine | Admitting: Family Medicine

## 2022-11-12 ENCOUNTER — Encounter (HOSPITAL_COMMUNITY): Payer: Self-pay

## 2022-11-12 VITALS — BP 126/74 | HR 69 | Wt 205.8 lb

## 2022-11-12 DIAGNOSIS — I130 Hypertensive heart and chronic kidney disease with heart failure and stage 1 through stage 4 chronic kidney disease, or unspecified chronic kidney disease: Secondary | ICD-10-CM | POA: Insufficient documentation

## 2022-11-12 DIAGNOSIS — Z7984 Long term (current) use of oral hypoglycemic drugs: Secondary | ICD-10-CM | POA: Insufficient documentation

## 2022-11-12 DIAGNOSIS — M109 Gout, unspecified: Secondary | ICD-10-CM | POA: Diagnosis not present

## 2022-11-12 DIAGNOSIS — I5022 Chronic systolic (congestive) heart failure: Secondary | ICD-10-CM

## 2022-11-12 DIAGNOSIS — E039 Hypothyroidism, unspecified: Secondary | ICD-10-CM | POA: Diagnosis not present

## 2022-11-12 DIAGNOSIS — Z79899 Other long term (current) drug therapy: Secondary | ICD-10-CM | POA: Diagnosis not present

## 2022-11-12 DIAGNOSIS — I493 Ventricular premature depolarization: Secondary | ICD-10-CM | POA: Diagnosis not present

## 2022-11-12 DIAGNOSIS — H5462 Unqualified visual loss, left eye, normal vision right eye: Secondary | ICD-10-CM | POA: Insufficient documentation

## 2022-11-12 DIAGNOSIS — I10 Essential (primary) hypertension: Secondary | ICD-10-CM | POA: Diagnosis not present

## 2022-11-12 DIAGNOSIS — H3562 Retinal hemorrhage, left eye: Secondary | ICD-10-CM | POA: Diagnosis not present

## 2022-11-12 DIAGNOSIS — I340 Nonrheumatic mitral (valve) insufficiency: Secondary | ICD-10-CM

## 2022-11-12 DIAGNOSIS — I110 Hypertensive heart disease with heart failure: Secondary | ICD-10-CM | POA: Diagnosis not present

## 2022-11-12 DIAGNOSIS — N183 Chronic kidney disease, stage 3 unspecified: Secondary | ICD-10-CM | POA: Diagnosis not present

## 2022-11-12 DIAGNOSIS — E1122 Type 2 diabetes mellitus with diabetic chronic kidney disease: Secondary | ICD-10-CM

## 2022-11-12 DIAGNOSIS — Z7982 Long term (current) use of aspirin: Secondary | ICD-10-CM | POA: Diagnosis not present

## 2022-11-12 DIAGNOSIS — I2510 Atherosclerotic heart disease of native coronary artery without angina pectoris: Secondary | ICD-10-CM

## 2022-11-12 NOTE — Patient Instructions (Signed)
Thank you for coming in today  Labs were done today, if any labs are abnormal the clinic will call you No news is good news   You are scheduled for a TEE/Cardioversion/TEE Cardioversion on April 1st, 2024 with Dr. Aundra Dubin.  Please arrive at the [facility] (Main Entrance A) at [facility]: [address] [place], [place] [address] at 8:30 am. (1 hour prior to procedure unless lab work is needed; if lab work is needed arrive 1.5 hours ahead)  DIET: Nothing to eat or drink after midnight except a sip of water with medications (see medication instructions below)  Medication Instructions: Hold Diuretics   Continue your anticoagulant:  You will need to continue your anticoagulant after your procedure until you  are told by your  Provider that it is safe to stop   Labs: If patient is on Coumadin, patient needs pt/INR, CBC, BMET within 3 days (No pt/INR needed for patients taking Xarelto, Eliquis, Pradaxa) For patients receiving anesthesia for TEE and all Cardioversion patients: BMET, CBC within 1 week   You must have a responsible person to drive you home and stay in the waiting area during your procedure. Failure to do so could result in cancellation.  Bring your insurance cards.  *Special Note: Every effort is made to have your procedure done on time. Occasionally there are emergencies that occur at the hospital that may cause delays. Please be patient if a delay does occur.    Medications:   Follow up appointments:  Your physician recommends that you schedule a follow-up appointment in:     Do the following things EVERYDAY: Weigh yourself in the morning before breakfast. Write it down and keep it in a log. Take your medicines as prescribed Eat low salt foods--Limit salt (sodium) to 2000 mg per day.  Stay as active as you can everyday Limit all fluids for the day to less than 2 liters   At the [facility], you and your health needs are our priority. As  part of our continuing mission to provide you with exceptional heart care, we have created designated Provider Care Teams. These Care Teams include your primary Cardiologist (physician) and Advanced Practice Providers (APPs- Physician Assistants and Nurse Practitioners) who all work together to provide you with the care you need, when you need it.   You may see any of the following providers on your designated Care Team at your next follow up: Dr Glori Bickers Dr Loralie Champagne Dr. Roxana Hires, NP Lyda Jester, [place] [facility] [place], [place] Forestine Na, NP Audry Riles, PharmD   Please be sure to bring in all your medications bottles to every appointment.    Thank you for choosing [facility]  If you have any questions or concerns before your next appointment please send [place] a message through [place] or call our office at [phone].    TO LEAVE A MESSAGE FOR THE NURSE SELECT OPTION 2, PLEASE LEAVE A MESSAGE INCLUDING: YOUR NAME DATE OF BIRTH CALL BACK NUMBER REASON FOR CALL**this is important as we prioritize the call backs  YOU WILL RECEIVE A CALL BACK THE SAME DAY AS LONG AS YOU CALL BEFORE 4:00 PM

## 2022-11-18 ENCOUNTER — Other Ambulatory Visit (HOSPITAL_BASED_OUTPATIENT_CLINIC_OR_DEPARTMENT_OTHER): Payer: Self-pay

## 2022-11-18 DIAGNOSIS — Z006 Encounter for examination for normal comparison and control in clinical research program: Secondary | ICD-10-CM

## 2022-11-18 NOTE — Research (Signed)
DETECT FOLLOW UP VISIT via PHONE - MONTH 3 (+/- 2 weeks)  VISIT DETAILS  Date of Visit: 18/Mar/2024   NYHA  NYHA Class: ? 1    X 2     ? 3     ? 4   SMOKING HISTORY  Has the subject smoked tobacco since the last visit? ? Yes X No  Does the subject currently smoke tobacco? ? Yes X No  If yes, how many packs per week? ________________   CONCOMITANT MEDICATIONS  Have there been any changes to the subject's medications since last visit?  If yes, please update Concomitant Medications Log. X Yes ? No   ADVERSE EVENTS  Have any new adverse events (AEs) occurred since last visit?  If yes, please update Adverse Events Log. ? Yes X No   ELECTROCARDIOGRAM*                                                                                                                                       [x]  Not Done Information will be extracted from the subject's most recent ECG in case one was conducted since the last CHF clinic visit.  ECHOCARDIOGRAPHY*                             *Upload Echocardiogram in [facility]                                                   [x]  Not Done Information will be extracted from the subjects most recent Echocardiogram in case one was conducted since the last CHF clinic visit.    Current Outpatient Medications:    aspirin EC 81 MG tablet, Take 1 tablet (81 mg total) by mouth daily. Swallow whole., Disp: 30 tablet, Rfl:    Blood Glucose Monitoring Suppl (TRUE METRIX METER) W/DEVICE KIT, 1 each by Does not apply route 3 (three) times daily., Disp: 1 kit, Rfl: 0   carvedilol (COREG) 12.5 MG tablet, Take 1 tablet (12.5 mg total) by mouth 2 (two) times daily., Disp: 180 tablet, Rfl: 3   dapagliflozin propanediol (FARXIGA) 10 MG TABS tablet, Take 1 tablet (10 mg total) by mouth daily., Disp: 90 tablet, Rfl: 3   glucose blood test strip, Use as instructed, Disp: 100 each, Rfl: 12   hydrALAZINE (APRESOLINE) 50 MG tablet, Take 1 tablet (50 mg total) by mouth 3 (three) times daily., Disp:  270 tablet, Rfl: 3   isosorbide mononitrate (IMDUR) 30 MG 24 hr tablet, Take 1 tablet (30 mg total) by mouth daily., Disp: 90 tablet, Rfl: 3   levothyroxine (SYNTHROID) 75 MCG tablet, Take 1 tablet (75 mcg total) by mouth daily., Disp: 30 tablet, Rfl: 3   metFORMIN (GLUCOPHAGE)  1000 MG tablet, Take 1,000 mg by mouth 2 (two) times daily with a meal., Disp: , Rfl:    mexiletine (MEXITIL) 150 MG capsule, Take 1 capsule (150 mg total) by mouth every 12 (twelve) hours. NEED OV., Disp: 180 capsule, Rfl: 0   rosuvastatin (CRESTOR) 20 MG tablet, Take 1 tablet (20 mg total) by mouth daily., Disp: 90 tablet, Rfl: 3   sacubitril-valsartan (ENTRESTO) 97-103 MG, Take 1 tablet by mouth 2 (two) times daily., Disp: 60 tablet, Rfl: 11   spironolactone (ALDACTONE) 25 MG tablet, Take 1 tablet (25 mg total) by mouth daily., Disp: 90 tablet, Rfl: 3

## 2022-11-27 ENCOUNTER — Ambulatory Visit (HOSPITAL_COMMUNITY)
Admission: RE | Admit: 2022-11-27 | Discharge: 2022-11-27 | Disposition: A | Discharge status: Home / Self Care | Payer: Medicaid Other | Source: Ambulatory Visit | Attending: Internal Medicine | Admitting: Internal Medicine

## 2022-11-27 DIAGNOSIS — I5022 Chronic systolic (congestive) heart failure: Secondary | ICD-10-CM | POA: Insufficient documentation

## 2022-11-27 LAB — CBC
HCT: 37.3 % — ABNORMAL LOW (ref 39.0–52.0)
Hemoglobin: 13 g/dL (ref 13.0–17.0)
MCH: 30.7 pg (ref 26.0–34.0)
MCHC: 34.9 g/dL (ref 30.0–36.0)
MCV: 88.2 fL (ref 80.0–100.0)
Platelets: 127 10*3/uL — ABNORMAL LOW (ref 150–400)
RBC: 4.23 MIL/uL (ref 4.22–5.81)
RDW: 13.2 % (ref 11.5–15.5)
WBC: 4.6 10*3/uL (ref 4.0–10.5)
nRBC: 0 % (ref 0.0–0.2)

## 2022-11-27 LAB — BASIC METABOLIC PANEL
Anion gap: 8 (ref 5–15)
BUN: 16 mg/dL (ref 8–23)
CO2: 22 mmol/L (ref 22–32)
Calcium: 9.2 mg/dL (ref 8.9–10.3)
Chloride: 108 mmol/L (ref 98–111)
Creatinine, Ser: 1.36 mg/dL — ABNORMAL HIGH (ref 0.61–1.24)
GFR, Estimated: 59 mL/min — ABNORMAL LOW (ref >60)
Glucose, Bld: 153 mg/dL — ABNORMAL HIGH (ref 70–99)
Potassium: 4.3 mmol/L (ref 3.5–5.1)
Sodium: 138 mmol/L (ref 135–145)

## 2022-11-28 ENCOUNTER — Telehealth: Payer: Self-pay | Admitting: *Deleted

## 2022-11-28 NOTE — Telephone Encounter (Signed)
TEE auth approved    Order ID: [id number]       Authorized  Approval Valid Through: 11/28/2022 - 01/26/2023

## 2022-11-29 ENCOUNTER — Telehealth (HOSPITAL_COMMUNITY): Payer: Self-pay

## 2022-11-29 NOTE — Telephone Encounter (Signed)
Pre procedure call,left message to call back office if any questions about upcoming procedure

## 2022-12-02 ENCOUNTER — Ambulatory Visit (HOSPITAL_BASED_OUTPATIENT_CLINIC_OR_DEPARTMENT_OTHER)
Admission: RE | Admit: 2022-12-02 | Discharge: 2022-12-02 | Disposition: A | Discharge status: Home / Self Care | Payer: Medicaid Other | Source: Home / Self Care | Attending: Cardiology | Admitting: Cardiology

## 2022-12-02 ENCOUNTER — Ambulatory Visit (HOSPITAL_COMMUNITY): Payer: Medicaid Other | Admitting: Anesthesiology

## 2022-12-02 ENCOUNTER — Ambulatory Visit (HOSPITAL_BASED_OUTPATIENT_CLINIC_OR_DEPARTMENT_OTHER): Payer: Medicaid Other | Admitting: Anesthesiology

## 2022-12-02 ENCOUNTER — Encounter (HOSPITAL_COMMUNITY)
Admission: RE | Disposition: A | Discharge status: Home / Self Care | Payer: Self-pay | Source: Home / Self Care | Attending: Cardiology

## 2022-12-02 ENCOUNTER — Encounter (HOSPITAL_COMMUNITY): Payer: Self-pay | Admitting: Cardiology

## 2022-12-02 ENCOUNTER — Ambulatory Visit (HOSPITAL_COMMUNITY)
Admission: RE | Admit: 2022-12-02 | Discharge: 2022-12-02 | Disposition: A | Discharge status: Home / Self Care | Payer: Medicaid Other | Attending: Cardiology | Admitting: Cardiology

## 2022-12-02 ENCOUNTER — Other Ambulatory Visit: Payer: Self-pay

## 2022-12-02 DIAGNOSIS — E119 Type 2 diabetes mellitus without complications: Secondary | ICD-10-CM | POA: Diagnosis not present

## 2022-12-02 DIAGNOSIS — I340 Nonrheumatic mitral (valve) insufficiency: Secondary | ICD-10-CM

## 2022-12-02 DIAGNOSIS — Z7984 Long term (current) use of oral hypoglycemic drugs: Secondary | ICD-10-CM | POA: Insufficient documentation

## 2022-12-02 DIAGNOSIS — E039 Hypothyroidism, unspecified: Secondary | ICD-10-CM | POA: Diagnosis not present

## 2022-12-02 DIAGNOSIS — Z79899 Other long term (current) drug therapy: Secondary | ICD-10-CM | POA: Insufficient documentation

## 2022-12-02 DIAGNOSIS — M109 Gout, unspecified: Secondary | ICD-10-CM | POA: Insufficient documentation

## 2022-12-02 DIAGNOSIS — H5462 Unqualified visual loss, left eye, normal vision right eye: Secondary | ICD-10-CM | POA: Diagnosis not present

## 2022-12-02 DIAGNOSIS — N183 Chronic kidney disease, stage 3 unspecified: Secondary | ICD-10-CM | POA: Insufficient documentation

## 2022-12-02 DIAGNOSIS — I081 Rheumatic disorders of both mitral and tricuspid valves: Secondary | ICD-10-CM | POA: Diagnosis not present

## 2022-12-02 DIAGNOSIS — I10 Essential (primary) hypertension: Secondary | ICD-10-CM

## 2022-12-02 DIAGNOSIS — I517 Cardiomegaly: Secondary | ICD-10-CM | POA: Diagnosis not present

## 2022-12-02 DIAGNOSIS — E1122 Type 2 diabetes mellitus with diabetic chronic kidney disease: Secondary | ICD-10-CM | POA: Diagnosis not present

## 2022-12-02 DIAGNOSIS — I2510 Atherosclerotic heart disease of native coronary artery without angina pectoris: Secondary | ICD-10-CM | POA: Diagnosis not present

## 2022-12-02 DIAGNOSIS — I5022 Chronic systolic (congestive) heart failure: Secondary | ICD-10-CM | POA: Insufficient documentation

## 2022-12-02 DIAGNOSIS — I130 Hypertensive heart and chronic kidney disease with heart failure and stage 1 through stage 4 chronic kidney disease, or unspecified chronic kidney disease: Secondary | ICD-10-CM | POA: Insufficient documentation

## 2022-12-02 HISTORY — PX: TEE WITHOUT CARDIOVERSION: SHX5443

## 2022-12-02 LAB — GLUCOSE, CAPILLARY: Glucose-Capillary: 123 mg/dL — ABNORMAL HIGH (ref 70–99)

## 2022-12-02 LAB — ECHO TEE

## 2022-12-02 SURGERY — ECHOCARDIOGRAM, TRANSESOPHAGEAL
Anesthesia: Monitor Anesthesia Care

## 2022-12-02 MED ORDER — PROPOFOL 10 MG/ML IV BOLUS
INTRAVENOUS | Status: DC | PRN
  Administered 2022-12-02: 50 mg via INTRAVENOUS
  Administered 2022-12-02: 30 mg via INTRAVENOUS

## 2022-12-02 MED ORDER — SODIUM CHLORIDE 0.9 % IV SOLN: INTRAVENOUS | Status: DC

## 2022-12-02 MED ORDER — PROPOFOL 500 MG/50ML IV EMUL
INTRAVENOUS | Status: DC | PRN
  Administered 2022-12-02: 100 ug/kg/min via INTRAVENOUS

## 2022-12-02 MED ORDER — PHENYLEPHRINE HCL-NACL 20-0.9 MG/250ML-% IV SOLN
INTRAVENOUS | Status: DC | PRN
  Administered 2022-12-02: 50 ug/min via INTRAVENOUS

## 2022-12-02 NOTE — Interval H&P Note (Signed)
History and Physical Interval Note:  12/02/2022 9:31 AM  Eric Larson  has presented today for surgery, with the diagnosis of mitral regurgitation.  The various methods of treatment have been discussed with the patient and family. After consideration of risks, benefits and other options for treatment, the patient has consented to  Procedure(s): TRANSESOPHAGEAL ECHOCARDIOGRAM (N/A) as a surgical intervention.  The patient's history has been reviewed, patient examined, no change in status, stable for surgery.  I have reviewed the patient's chart and labs.  Questions were answered to the patient's satisfaction.     Margreat Widener [employer]

## 2022-12-02 NOTE — Transfer of Care (Signed)
Immediate Anesthesia Transfer of Care Note  Patient: Eric Larson  Procedure(s) Performed: TRANSESOPHAGEAL ECHOCARDIOGRAM  Patient Location: Endoscopy Unit  Anesthesia Type:MAC  Level of Consciousness: awake, alert , and oriented  Airway & Oxygen Therapy: Patient Spontanous Breathing  Post-op Assessment: Report given to RN and Post -op Vital signs reviewed and stable  Post vital signs: Reviewed and stable  Last Vitals:  Vitals Value Taken Time  BP 150/70   Temp 98   Pulse 63 12/02/22 0950  Resp 16   SpO2 98 % 12/02/22 0950  Vitals shown include unvalidated device data.  Last Pain:  Vitals:   12/02/22 0819  TempSrc: Temporal  PainSc: 0-No pain         Complications: No notable events documented.

## 2022-12-02 NOTE — Anesthesia Preprocedure Evaluation (Signed)
Anesthesia Evaluation  Patient identified by MRN, date of birth, ID band Patient awake    Reviewed: Allergy & Precautions, H&P , NPO status , Patient's Chart, lab work & pertinent test results  Airway Mallampati: II  TM Distance: >3 FB Neck ROM: Full    Dental no notable dental hx.    Pulmonary neg pulmonary ROS   Pulmonary exam normal breath sounds clear to auscultation       Cardiovascular hypertension, + Valvular Problems/Murmurs MR  Rhythm:Regular Rate:Normal + Systolic murmurs    Neuro/Psych negative neurological ROS  negative psych ROS   GI/Hepatic Neg liver ROS,GERD  ,,  Endo/Other  diabetes, Type 2Hypothyroidism    Renal/GU Renal InsufficiencyRenal disease  negative genitourinary   Musculoskeletal negative musculoskeletal ROS (+)    Abdominal   Peds negative pediatric ROS (+)  Hematology negative hematology ROS (+)   Anesthesia Other Findings   Reproductive/Obstetrics negative OB ROS                             Anesthesia Physical Anesthesia Plan  ASA: 3  Anesthesia Plan: MAC   Post-op Pain Management: Minimal or no pain anticipated   Induction: Intravenous  PONV Risk Score and Plan: 1 and Propofol infusion and Treatment may vary due to age or medical condition  Airway Management Planned: Simple Face Mask  Additional Equipment:   Intra-op Plan:   Post-operative Plan:   Informed Consent: I have reviewed the patients History and Physical, chart, labs and discussed the procedure including the risks, benefits and alternatives for the proposed anesthesia with the patient or authorized representative who has indicated his/her understanding and acceptance.     Dental advisory given  Plan Discussed with: CRNA and Surgeon  Anesthesia Plan Comments:        Anesthesia Quick Evaluation

## 2022-12-02 NOTE — CV Procedure (Signed)
Procedure: TEE  Sedation: Per anesthesiology  Indication: Mitral regurgitation  Findings: Please see echo section for full report.  Normal LV size with mild LV hypertrophy (concentric).  LV EF 45-50%, diffuse mild hypokinesis.  Normal RV size and systolic function.  Mild left atrial enlargement, no LA appendage thrombus.  Normal right atrium.  No ASD/PFO by color doppler.  Trileaflet aortic valve, no stenosis or regurgitation.  Trivial TR.  Mild mitral regurgitation.  Normal caliber thoracic aorta with mild plaque.   Mitral regurgitation is only mild.   Eric Larson 12/02/2022 9:46 AM

## 2022-12-02 NOTE — Discharge Instructions (Signed)
TEE  YOU HAD AN CARDIAC PROCEDURE TODAY: Refer to the procedure report and other information in the discharge instructions given to you for any specific questions about what was found during the examination. If this information does not answer your questions, please call [facility] office at [phone] to clarify.   DIET: Your first meal following the procedure should be a light meal and then it is ok to progress to your normal diet. A half-sandwich or bowl of soup is an example of a good first meal. Heavy or fried foods are harder to digest and may make you feel nauseous or bloated. Drink plenty of fluids but you should avoid alcoholic beverages for 24 hours. If you had a esophageal dilation, please see attached instructions for diet.   ACTIVITY: Your care partner should take you home directly after the procedure. You should plan to take it easy, moving slowly for the rest of the day. You can resume normal activity the day after the procedure however YOU SHOULD NOT DRIVE, use power tools, machinery or perform tasks that involve climbing or major physical exertion for 24 hours (because of the sedation medicines used during the test).   SYMPTOMS TO REPORT IMMEDIATELY: A cardiologist can be reached at any hour. Please call [phone] for any of the following symptoms:  Vomiting of blood or coffee ground material  New, significant abdominal pain  New, significant chest pain or pain under the shoulder blades  Painful or persistently difficult swallowing  New shortness of breath  Black, tarry-looking or red, bloody stools  FOLLOW UP:  Please also call with any specific questions about appointments or follow up tests. TEE  YOU HAD AN CARDIAC PROCEDURE TODAY: Refer to the procedure report and other information in the discharge instructions given to you for any specific questions about what was found during the examination. If this information does not answer your questions, please call [facility] office at [phone] to clarify.   DIET: Your first meal following the procedure should be a light meal and then it is ok to progress to your normal diet. A half-sandwich or bowl of soup is an example of a good first meal. Heavy or fried foods are harder to digest and may make you feel nauseous or bloated. Drink plenty of fluids but you should avoid alcoholic beverages for 24 hours. If you had a esophageal dilation, please see attached instructions for diet.   ACTIVITY: Your care partner should take you home directly after the procedure. You should plan to take it easy, moving slowly for the rest of the day. You can resume normal activity the day after the procedure however YOU SHOULD NOT DRIVE, use power tools, machinery or perform tasks that involve climbing or major physical exertion for 24 hours (because of the sedation medicines used during the test).   SYMPTOMS TO REPORT IMMEDIATELY: A cardiologist can be reached at any hour. Please call [phone] for any of the following symptoms:  Vomiting of blood or coffee ground material  New, significant abdominal pain  New, significant chest pain or pain under the shoulder blades  Painful or persistently difficult swallowing  New shortness of breath  Black, tarry-looking or red, bloody stools  FOLLOW UP:  Please also call with any specific questions about appointments or follow up tests.  

## 2022-12-03 ENCOUNTER — Encounter (HOSPITAL_COMMUNITY): Payer: Self-pay | Admitting: Cardiology

## 2022-12-03 NOTE — Anesthesia Postprocedure Evaluation (Signed)
Anesthesia Post Note  Patient: Eric Larson  Procedure(s) Performed: TRANSESOPHAGEAL ECHOCARDIOGRAM     Patient location during evaluation: PACU Anesthesia Type: MAC Level of consciousness: awake and alert Pain management: pain level controlled Vital Signs Assessment: post-procedure vital signs reviewed and stable Respiratory status: spontaneous breathing, nonlabored ventilation, respiratory function stable and patient connected to nasal cannula oxygen Cardiovascular status: stable and blood pressure returned to baseline Postop Assessment: no apparent nausea or vomiting Anesthetic complications: no  No notable events documented.  Last Vitals:  Vitals:   12/02/22 1000 12/02/22 1010  BP: (!) 149/75 (!) 154/82  Pulse: 60 (!) 56  Resp: 17 17  Temp:    SpO2: 100% 96%    Last Pain:  Vitals:   12/02/22 1010  TempSrc:   PainSc: 0-No pain                 Tristram Milian S

## 2022-12-12 ENCOUNTER — Other Ambulatory Visit (HOSPITAL_COMMUNITY): Payer: Self-pay | Admitting: Family Medicine

## 2022-12-12 ENCOUNTER — Other Ambulatory Visit (HOSPITAL_COMMUNITY): Payer: Self-pay

## 2022-12-12 DIAGNOSIS — I493 Ventricular premature depolarization: Secondary | ICD-10-CM

## 2022-12-16 ENCOUNTER — Other Ambulatory Visit (HOSPITAL_COMMUNITY): Payer: Self-pay

## 2022-12-16 MED ORDER — LEVOTHYROXINE SODIUM 75 MCG PO TABS
75.0000 ug | ORAL_TABLET | Freq: Every morning | ORAL | 0 refills | Status: DC
  Filled 2022-12-16: qty 14, 14d supply, fill #0

## 2022-12-29 DIAGNOSIS — H5213 Myopia, bilateral: Secondary | ICD-10-CM | POA: Diagnosis not present

## 2023-01-28 ENCOUNTER — Other Ambulatory Visit (HOSPITAL_COMMUNITY): Payer: Self-pay

## 2023-01-28 ENCOUNTER — Ambulatory Visit (HOSPITAL_COMMUNITY)
Admission: RE | Admit: 2023-01-28 | Discharge: 2023-01-28 | Disposition: A | Discharge status: Home / Self Care | Payer: Medicaid Other | Source: Ambulatory Visit | Attending: Cardiology | Admitting: Cardiology

## 2023-01-28 ENCOUNTER — Other Ambulatory Visit: Payer: Self-pay

## 2023-01-28 VITALS — BP 138/90 | HR 62 | Wt 200.0 lb

## 2023-01-28 DIAGNOSIS — N183 Chronic kidney disease, stage 3 unspecified: Secondary | ICD-10-CM | POA: Insufficient documentation

## 2023-01-28 DIAGNOSIS — Z79899 Other long term (current) drug therapy: Secondary | ICD-10-CM | POA: Insufficient documentation

## 2023-01-28 DIAGNOSIS — E1122 Type 2 diabetes mellitus with diabetic chronic kidney disease: Secondary | ICD-10-CM | POA: Insufficient documentation

## 2023-01-28 DIAGNOSIS — I130 Hypertensive heart and chronic kidney disease with heart failure and stage 1 through stage 4 chronic kidney disease, or unspecified chronic kidney disease: Secondary | ICD-10-CM | POA: Insufficient documentation

## 2023-01-28 DIAGNOSIS — I340 Nonrheumatic mitral (valve) insufficiency: Secondary | ICD-10-CM | POA: Diagnosis not present

## 2023-01-28 DIAGNOSIS — I2510 Atherosclerotic heart disease of native coronary artery without angina pectoris: Secondary | ICD-10-CM | POA: Insufficient documentation

## 2023-01-28 DIAGNOSIS — I493 Ventricular premature depolarization: Secondary | ICD-10-CM | POA: Insufficient documentation

## 2023-01-28 DIAGNOSIS — I428 Other cardiomyopathies: Secondary | ICD-10-CM | POA: Diagnosis not present

## 2023-01-28 DIAGNOSIS — Z7982 Long term (current) use of aspirin: Secondary | ICD-10-CM | POA: Insufficient documentation

## 2023-01-28 DIAGNOSIS — I5022 Chronic systolic (congestive) heart failure: Secondary | ICD-10-CM | POA: Insufficient documentation

## 2023-01-28 DIAGNOSIS — Z7984 Long term (current) use of oral hypoglycemic drugs: Secondary | ICD-10-CM | POA: Diagnosis not present

## 2023-01-28 DIAGNOSIS — Z006 Encounter for examination for normal comparison and control in clinical research program: Secondary | ICD-10-CM

## 2023-01-28 LAB — BRAIN NATRIURETIC PEPTIDE: B Natriuretic Peptide: 183.6 pg/mL — ABNORMAL HIGH (ref 0.0–100.0)

## 2023-01-28 LAB — BASIC METABOLIC PANEL
Anion gap: 9 (ref 5–15)
BUN: 21 mg/dL (ref 8–23)
CO2: 24 mmol/L (ref 22–32)
Calcium: 8.9 mg/dL (ref 8.9–10.3)
Chloride: 104 mmol/L (ref 98–111)
Creatinine, Ser: 1.39 mg/dL — ABNORMAL HIGH (ref 0.61–1.24)
GFR, Estimated: 57 mL/min — ABNORMAL LOW (ref >60)
Glucose, Bld: 100 mg/dL — ABNORMAL HIGH (ref 70–99)
Potassium: 3.9 mmol/L (ref 3.5–5.1)
Sodium: 137 mmol/L (ref 135–145)

## 2023-01-28 MED ORDER — CARVEDILOL 12.5 MG PO TABS
18.7500 mg | ORAL_TABLET | Freq: Two times a day (BID) | ORAL | 3 refills | Status: DC
  Filled 2023-01-28: qty 270, 90d supply, fill #0

## 2023-01-28 MED ORDER — MEXILETINE HCL 150 MG PO CAPS
150.0000 mg | ORAL_CAPSULE | Freq: Two times a day (BID) | ORAL | 0 refills | Status: DC
  Filled 2023-01-28: qty 180, 90d supply, fill #0

## 2023-01-28 MED ORDER — ENTRESTO 97-103 MG PO TABS
1.0000 | ORAL_TABLET | Freq: Two times a day (BID) | ORAL | 11 refills | Status: DC
  Filled 2023-01-28: qty 60, 30d supply, fill #0
  Filled 2023-03-24: qty 60, 30d supply, fill #1
  Filled 2023-05-13: qty 60, 30d supply, fill #2
  Filled 2023-06-19: qty 60, 30d supply, fill #3
  Filled 2023-08-15: qty 60, 30d supply, fill #4
  Filled 2023-10-01: qty 60, 30d supply, fill #5
  Filled 2023-10-28: qty 60, 30d supply, fill #6
  Filled 2024-01-06: qty 60, 30d supply, fill #7

## 2023-01-28 MED ORDER — DAPAGLIFLOZIN PROPANEDIOL 10 MG PO TABS
10.0000 mg | ORAL_TABLET | Freq: Every day | ORAL | 3 refills | Status: DC
  Filled 2023-01-28 – 2023-04-04 (×4): qty 90, 90d supply, fill #0
  Filled 2023-06-19: qty 90, 90d supply, fill #1
  Filled 2023-10-01: qty 90, 90d supply, fill #2
  Filled 2024-01-06: qty 90, 90d supply, fill #3

## 2023-01-28 MED ORDER — HYDRALAZINE HCL 50 MG PO TABS
50.0000 mg | ORAL_TABLET | Freq: Three times a day (TID) | ORAL | 3 refills | Status: DC
  Filled 2023-01-28: qty 270, 90d supply, fill #0
  Filled 2023-05-13: qty 270, 90d supply, fill #1
  Filled 2023-08-15 (×2): qty 270, 90d supply, fill #2

## 2023-01-28 MED ORDER — SPIRONOLACTONE 25 MG PO TABS
25.0000 mg | ORAL_TABLET | Freq: Every day | ORAL | 3 refills | Status: DC
  Filled 2023-01-28: qty 90, 90d supply, fill #0
  Filled 2023-05-13: qty 90, 90d supply, fill #1
  Filled 2023-08-15 (×2): qty 90, 90d supply, fill #2
  Filled 2024-01-06: qty 90, 90d supply, fill #3

## 2023-01-28 MED ORDER — ROSUVASTATIN CALCIUM 20 MG PO TABS
20.0000 mg | ORAL_TABLET | Freq: Every day | ORAL | 3 refills | Status: DC
  Filled 2023-01-28: qty 90, 90d supply, fill #0
  Filled 2023-05-13: qty 90, 90d supply, fill #1
  Filled 2023-08-15 (×2): qty 90, 90d supply, fill #2

## 2023-01-28 MED ORDER — ISOSORBIDE MONONITRATE ER 30 MG PO TB24
30.0000 mg | ORAL_TABLET | Freq: Every day | ORAL | 3 refills | Status: DC
  Filled 2023-01-28: qty 90, 90d supply, fill #0
  Filled 2023-05-13: qty 90, 90d supply, fill #1
  Filled 2023-08-15 (×2): qty 90, 90d supply, fill #2
  Filled 2024-01-06: qty 90, 90d supply, fill #3

## 2023-01-28 NOTE — Patient Instructions (Addendum)
INCREASE Carvedilol to 18.75 mg ( 1 1/2 Tab)  Twice daily  Labs done today, your results will be available in MyChart, we will contact you for abnormal readings.  Your physician recommends that you schedule a follow-up appointment in: 3 months.  If you have any questions or concerns before your next appointment please send [place] a message through [place] or call our office at [phone].    TO LEAVE A MESSAGE FOR THE NURSE SELECT OPTION 2, PLEASE LEAVE A MESSAGE INCLUDING: YOUR NAME DATE OF BIRTH CALL BACK NUMBER REASON FOR CALL**this is important as we prioritize the call backs  YOU WILL RECEIVE A CALL BACK THE SAME DAY AS LONG AS YOU CALL BEFORE 4:00 PM  At the [facility], you and your health needs are our priority. As part of our continuing mission to provide you with exceptional heart care, we have created designated Provider Care Teams. These Care Teams include your primary Cardiologist (physician) and Advanced Practice Providers (APPs- Physician Assistants and Nurse Practitioners) who all work together to provide you with the care you need, when you need it.   You may see any of the following providers on your designated Care Team at your next follow up: Dr Arvilla Meres Dr Marca Ancona Dr. Marcos Eke, NP Robbie Lis, [place] [facility] [place], [place] Brynda Peon, NP Karle Plumber, PharmD   Please be sure to bring in all your medications bottles to every appointment.    Thank you for choosing [facility]

## 2023-01-28 NOTE — Research (Addendum)
DETECT 6 MONTH FOLLOW UP IN CLINIC   VISIT DETAILS  Date of Visit: 28/May/2024   NYHA  NYHA Class: [] 1 [x] 2 [] 3 [] 4    CARDIAC PHYSICAL EXAMINATION                                                                                                                    []  Not Done  Total JVP: 7 cm   (Normal = up to total of 7cm)   Peripheral Edema Grade: [x] 0 [] 1 [] 2 [] 3 [] 4    PHYSICIANS DYSPNEA ASSESSMENT      []  Not Done   Physicians Dyspnea Assessment: Dyspnea [x]   Yes []   No   Speech Dyspnea  []  Yes [x]   No   Paroxysmal Nocturnal Dyspnea   []  Yes [x]   No   Orthopnea  []  Yes [x]  No   Pulmonary Congestion    (check severe in Q2 below)  []  Yes [x]   No   Pleural Fluid  []  Right []   Left []   Both        [x]   N/A   Wheezing  []  Yes [x]   No  Medical Condition:  [x]  Mild []  Moderate  []  Severe  Comments:    CLINICAL LABORATORY TESTS - CHEMISTRY & HEMATOLOGY            []  Not Done  Can be taken up to 1 month before visit, or at physician's discretion.  Please attach all relevant laboratory reports as required. If any results are considered clinically significant, please mark as such and report in medical history (if related to a pre-existent condition) or as an adverse event (if new finding).   [employer] RECORDING EQUIPMENT  Was there a change in equipment supplied by the [employer] since last visit?     [x]   Yes      []   No  New Phone/Tablet serial number: [id number]   New Phone/Tablet Model: [id number]    SMOKING HISTORY  Has the subject smoked tobacco since the last visit? []  Yes  [x]   No  Does the subject currently smoke tobacco? []  Yes [x]   No  If yes, how many packs per week? _________   CONCOMITANT MEDICATIONS  Have there been any changes to the subject's medications since last visit?  If yes, please update Concomitant Medications Log.  [x]  Yes []   No   ADVERSE EVENTS  Have any new adverse events (AEs) occurred since last visit?  If yes, please update Adverse Events Log. []  Yes   [x]  No      Current Outpatient Medications:    allopurinol (ZYLOPRIM) 300 MG tablet, Take 300 mg by mouth daily as needed (Gout)., Disp: , Rfl:    aspirin EC 81 MG tablet, Take 1 tablet (81 mg total) by mouth daily. Swallow whole., Disp: 30 tablet, Rfl:    Blood Glucose Monitoring Suppl (TRUE METRIX METER) W/DEVICE KIT, 1 each by Does not apply route 3 (three) times daily., Disp: 1 kit, Rfl: 0   carvedilol (  COREG) 12.5 MG tablet, Take 1.5 tablets (18.75 mg total) by mouth 2 (two) times daily., Disp: 270 tablet, Rfl: 3   dapagliflozin propanediol (FARXIGA) 10 MG TABS tablet, Take 1 tablet (10 mg total) by mouth daily., Disp: 90 tablet, Rfl: 3   glucose blood test strip, Use as instructed, Disp: 100 each, Rfl: 12   hydrALAZINE (APRESOLINE) 50 MG tablet, Take 1 tablet (50 mg total) by mouth 3 (three) times daily., Disp: 270 tablet, Rfl: 3   isosorbide mononitrate (IMDUR) 30 MG 24 hr tablet, Take 1 tablet (30 mg total) by mouth daily., Disp: 90 tablet, Rfl: 3   levothyroxine (SYNTHROID) 75 MCG tablet, Take 1 tablet (75 mcg total) by mouth daily., Disp: 30 tablet, Rfl: 3   levothyroxine (SYNTHROID) 75 MCG tablet, Take 1 tablet (75 mcg total) by mouth in the morning on an empty stomach, Disp: 14 tablet, Rfl: 0   metFORMIN (GLUCOPHAGE) 1000 MG tablet, Take 1,000 mg by mouth 2 (two) times daily with a meal., Disp: , Rfl:    mexiletine (MEXITIL) 150 MG capsule, Take 1 capsule (150 mg total) by mouth every 12 (twelve) hours. NEED OV., Disp: 180 capsule, Rfl: 0   pantoprazole (PROTONIX) 40 MG tablet, Take 40 mg by mouth daily., Disp: , Rfl:    rosuvastatin (CRESTOR) 20 MG tablet, Take 1 tablet (20 mg total) by mouth daily., Disp: 90 tablet, Rfl: 3   sacubitril-valsartan (ENTRESTO) 97-103 MG, Take 1 tablet by mouth 2 (two) times daily., Disp: 60 tablet, Rfl: 11   spironolactone (ALDACTONE) 25 MG tablet, Take 1 tablet (25 mg total) by mouth daily., Disp: 90 tablet, Rfl: 3

## 2023-01-28 NOTE — Progress Notes (Addendum)
ADVANCED HF CLINIC NOTE  Primary Care: Hortencia Conradi, NP HF Cardiologist: Dr. 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  Admitted 5/23 with new acute heart failure. Given IV lasix. Echo showed EF 15-20% range with mildly dilated LV, moderately dilated RV with severely decreased systolic function, moderate-severe functional mitral regurgitation, dilated IVC. Underwent R/LHC showing mildly elevated filling pressures, low CO and 70% proximal LAD stenosis, 90% distal LAD stenosis,  80% stenosis proximally in a moderate D1. Films reviewed with Dr. [place] and decided not to intervene in the absence of chest pain and presence of diffuse cardiomyopathy, out of proportion to known disease. Started on milrinone and continued with diuresis. Drips weaned off and GDMT titrated. Started on mexiletine with frequent PVCs. Discharged home, weight 201 lbs.  Echo 8/23 showed EF 30-35%, mild LV dilation, moderate RV dysfunction with moderate MR, IVC normal.   Cardiac MRI in 11/23 showed LV EF 49% with moderate LVH, RV EF 47%, severe MR by regurgitant fraction (41%) but not visually, noncoronary LGE noted in the mid-wall basal septum, mid-wall basal-mid inferior wall, mid-wall basal anterolateral wall.   Zio monitor (11/23) showed 7.7% PVCs, Coreg was increased.   Follow up 12/23, stable NYHA II symptoms. Arranged for TEE to evaluate etiology and severity of MR.  TEE in 4/24 showed EF 45-50%, mild LVH, mild MR, normal RV size/systolic function.  .   Today he returns for HF follow up. Able to walk 2 miles with no dyspnea or chest pain.  No major limitations. He can walk up a flight of stairs without dyspnea.  No lightheadedness/syncope.  No palpitations.  Weight down 5 lbs.   ECG (personally reviewed): NSR with PVCs  Labs (5/23): K 4.4, creatinine 1.82, hgb 12.5 Labs (7/23): K 4.1, creatinine  1.56 Labs (8/23): K 4.1, creatinine 1.28 Labs (10/23): K 4.2, creatinine 1.59 Labs (11/23): NT-pro-BNP 319, K 3.6, creatinine 1.28 Labs (12/23): K 4.0, creatinine 1.40 Labs (3/24): K 4.7, creatinine 1.53 => 1.36, LDL 67  PMH: 1. 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   - Echo (5/23): EF 15-20%, moderately dilated RV with severely decreased systolic function, moderate-severe functional mitral regurgitation.  - RHC/LHC (5/23): Mean RA 11, PA 40/21, mean PCWP 23, CI 1.89; 70% proximal LAD stenosis, 90% distal LAD stenosis,  80% stenosis proximally in a moderate D1.  - Echo (8/23): EF 30-35%, mild LV dilation, moderate RV dysfunction with moderate MR, IVC normal.  - Cardiac MRI in 11/23 showed LV EF 49% with moderate LVH, RV EF 47%, severe MR by regurgitant fraction (41%) but not visually, noncoronary LGE noted in the mid-wall basal septum, mid-wall basal-mid inferior wall, mid-wall basal anterolateral wall.  - TEE (4/24): EF 45-50%, mild LVH, mild MR, normal RV size/systolic function.  2. CAD: LHC (5/23) with 70% proximal LAD stenosis, 90% distal LAD stenosis,  80% stenosis proximally in a moderate D1. Managed medically.  3. HTN 4. DM2 5. 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 Gout 7. Hypothyroidism 8. PVCs: Zio monitor 11/23 with 7.7% PVCs.   Review of Systems: All systems reviewed and negative except as per HPI.   Current Outpatient Medications  Medication Sig Dispense Refill   allopurinol (ZYLOPRIM) 300 MG tablet Take 300 mg by mouth daily as needed (Gout).     aspirin EC 81 MG tablet Take 1 tablet (81 mg total) by mouth daily. Swallow  whole. 30 tablet    Blood Glucose Monitoring Suppl (TRUE METRIX METER) W/DEVICE KIT 1 each by Does not apply route 3 (three) times daily. 1 kit 0   glucose blood test strip Use as instructed 100 each 12   levothyroxine (SYNTHROID) 75 MCG tablet Take 1 tablet (75 mcg total) by mouth daily. 30 tablet 3   levothyroxine  (SYNTHROID) 75 MCG tablet Take 1 tablet (75 mcg total) by mouth in the morning on an empty stomach 14 tablet 0   metFORMIN (GLUCOPHAGE) 1000 MG tablet Take 1,000 mg by mouth 2 (two) times daily with a meal.     pantoprazole (PROTONIX) 40 MG tablet Take 40 mg by mouth daily.     carvedilol (COREG) 12.5 MG tablet Take 1.5 tablets (18.75 mg total) by mouth 2 (two) times daily. 270 tablet 3   dapagliflozin propanediol (FARXIGA) 10 MG TABS tablet Take 1 tablet (10 mg total) by mouth daily. 90 tablet 3   hydrALAZINE (APRESOLINE) 50 MG tablet Take 1 tablet (50 mg total) by mouth 3 (three) times daily. 270 tablet 3   isosorbide mononitrate (IMDUR) 30 MG 24 hr tablet Take 1 tablet (30 mg total) by mouth daily. 90 tablet 3   mexiletine (MEXITIL) 150 MG capsule Take 1 capsule (150 mg total) by mouth every 12 (twelve) hours. NEED OV. 180 capsule 0   rosuvastatin (CRESTOR) 20 MG tablet Take 1 tablet (20 mg total) by mouth daily. 90 tablet 3   sacubitril-valsartan (ENTRESTO) 97-103 MG Take 1 tablet by mouth 2 (two) times daily. 60 tablet 11   spironolactone (ALDACTONE) 25 MG tablet Take 1 tablet (25 mg total) by mouth daily. 90 tablet 3   No current facility-administered medications for this encounter.   No Known Allergies  Social History   Socioeconomic History   Marital status: Single    Spouse name: Not on file   Number of children: Not on file   Years of education: Not on file   Highest education level: Not on file  Occupational History   Not on file  Tobacco Use   Smoking status: Never   Smokeless tobacco: Never  Substance and Sexual Activity   Alcohol use: No   Drug use: No   Sexual activity: Yes  Other Topics Concern   Not on file  Social History Narrative   Not on file   Social Determinants of Health   Financial Resource Strain: Not on file  Food Insecurity: Not on file  Transportation Needs: Not on file  Physical Activity: Not on file  Stress: Not on file  Social Connections:  Not on file  Intimate Partner Violence: Not on file   Family History  Problem Relation Age of Onset   Heart attack Father    Diabetes Mellitus II Neg Hx    CAD Neg Hx    BP (!) 138/90   Pulse 62   Wt 90.7 kg (200 lb)   SpO2 97%   BMI 27.12 kg/m   Wt Readings from Last 3 Encounters:  01/28/23 90.7 kg (200 lb)  01/28/23 90.7 kg (200 lb)  12/02/22 93 kg (205 lb)   PHYSICAL EXAM: General: NAD Neck: No JVD, no thyromegaly or thyroid nodule.  Lungs: Clear to auscultation bilaterally with normal respiratory effort. CV: Nondisplaced PMI.  Heart regular S1/S2, no S3/S4, no murmur.  No peripheral edema.  No carotid bruit.  Normal pedal pulses.  Abdomen: Soft, nontender, no hepatosplenomegaly, no distention.  Skin: Intact without lesions or rashes.  Neurologic: Alert and oriented x 3.  Psych: Normal affect. Extremities: No clubbing or cyanosis.  HEENT: Normal.   ASSESSMENT & PLAN: 1. Chronic systolic CHF: Echo in 5/23 with EF 15-20% range with mildly dilated LV, moderately dilated RV with severely decreased systolic function, moderate-severe functional mitral regurgitation, dilated IVC.  Does not have history of ETOH or drugs.  No strong FH of cardiomyopathy.  He was noted in 5/23 hospitalization to have frequent PVCs, may contribute to CMP.  RHC in 5/23 showed elevated filling pressures and low cardiac output (CI 1.89). LHC showed moderate LAD disease, however cardiomyopathy was out of proportion to degree of CAD. 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 Echo 8/23 showed some improvement, EF up to 30-35%, despite being out of his meds x 2 weeks prior.  Cardiac MRI in 11/23 showed further improvement in LV EF to 49% with moderate LVH, RV EF 47%, severe MR by regurgitant fraction (41%) but not visually, noncoronary LGE noted in the mid-wall basal septum, mid-wall basal-mid inferior wall, mid-wall basal anterolateral wall.  MRI concerning for possible prior myocarditis or cardiac  sarcoidosis, but pattern could also be seen with a variant of hypertrophic cardiomyopathy given the moderate LVH. [facility] gene testing was negative for gene mutations associated with familial dilated cardiomyopathies and hypertrophic cardiomyopathy. On exam, he is not volume overloaded.  NYHA class I-II symptoms.  - Continue Entresto 97/103 mg bid. BMET/BNP today.  - He does not need Lasix.  - Increase Coreg to 18.75 mg bid.   - Continue dapagliflozin 10 mg daily.   - Continue hydralazine 50 mg tid + Imdur 30 mg daily. - Continue spironolactone 25 mg daily.  - Narrow QRS, would not be CRT candidate. EF is now out of ICD range.  - 2. CAD: Mod LAD and Diag disease on 5/23 cath. The proximal LAD lesion is not critical but looks to be around 70% (moderate stenosis).  This could potentially be intervened upon, as could the 1st diagonal (would not intervene on the severe far distal LAD stenosis).  However, patient reports no chest pain and his cardiomyopathy is diffuse and not explained by the moderate LAD disease. Would manage medically for now unless he develops chest pain.  - Continue ASA + statin, good lipids 3/24.  3. CKD stage 3: suspect baseline diabetic nephropathy.  - Continue Farxiga.  4. Diabetes: Type 2. Per PCP.  5. HTN: BP mildly elevated.  - Increase Coreg as above.  6. Mitral regurgitation: Mod-severe functional MR on 5/23 echo, echo 8/23 showed moderate MR.  Cardiac MRI in 11/23 did not appear to show significant MR visually but calculated regurgitant fraction was 41%. He does not have a loud murmur.  TEE in 4/24 showed only mild MR.  7. PVCs: Frequent during 5/23 admission. Zio monitor in 11/23 (on mexiletine) showed 7.7% PVCs.  - Continue mexiletine 150 mg bid.  - Increasing Coreg as above.   Followup in 3 months.   Marca Ancona  01/28/2023  CARDIAC PHYSICAL EXAMINATION        [] Not Done  Total JVP:  __7_ cm  (Normal = up to total of 7cm)   Peripheral Edema Grade: [x] 0  []  1    [] 2  [] 3 [] 4   PHYSICIANS DYSPNEA ASSESSMENT*                                 []  Not Done  Dyspnea [x] Yes [] No  NYHA Class []   1    [x] 2     [] 3    []  4  Speech Dyspnea [] Yes [x] No  Paroxysmal Nocturnal Dyspnea  [] Yes [x] No  Orthopnea [] Yes [x] No  Pulmonary Congestion (if yes, check severe under 'Medical Condition' below) [] Yes [x] No  Pleural Fluid [] Right [] Left[] Both      [x] N/A  Wheezing [] Yes [x] No  Severity of medical condition (select 'severe' if pulmonary congestion is present): [x] Mild   [] Moderate [] Severe  Comments: __________________________________________________________________________________________________________________________________________________________________________________________________________________________________________

## 2023-01-29 ENCOUNTER — Other Ambulatory Visit (HOSPITAL_COMMUNITY): Payer: Self-pay

## 2023-01-29 ENCOUNTER — Other Ambulatory Visit: Payer: Self-pay

## 2023-01-29 LAB — PRO B NATRIURETIC PEPTIDE: NT-Pro BNP: 631 pg/mL — ABNORMAL HIGH (ref 0–210)

## 2023-01-29 NOTE — Progress Notes (Signed)
Lab drawn for 6 month Detect clinic visit

## 2023-02-10 ENCOUNTER — Other Ambulatory Visit (HOSPITAL_COMMUNITY): Payer: Self-pay

## 2023-03-12 NOTE — Addendum Note (Signed)
Encounter addended by: Laurey Morale, MD on: 03/12/2023 2:45 PM  Actions taken: Clinical Note Signed

## 2023-03-24 ENCOUNTER — Other Ambulatory Visit (HOSPITAL_COMMUNITY): Payer: Self-pay

## 2023-03-31 ENCOUNTER — Other Ambulatory Visit (HOSPITAL_COMMUNITY): Payer: Self-pay

## 2023-04-04 ENCOUNTER — Other Ambulatory Visit (HOSPITAL_COMMUNITY): Payer: Self-pay

## 2023-04-04 ENCOUNTER — Other Ambulatory Visit (HOSPITAL_COMMUNITY): Payer: Self-pay | Admitting: Cardiology

## 2023-04-04 MED ORDER — LEVOTHYROXINE SODIUM 75 MCG PO TABS
75.0000 ug | ORAL_TABLET | Freq: Every morning | ORAL | 0 refills | Status: DC
  Filled 2023-04-04 – 2023-04-15 (×2): qty 14, 14d supply, fill #0

## 2023-04-07 ENCOUNTER — Other Ambulatory Visit (HOSPITAL_COMMUNITY): Payer: Self-pay

## 2023-04-08 ENCOUNTER — Other Ambulatory Visit (HOSPITAL_COMMUNITY): Payer: Self-pay

## 2023-04-14 ENCOUNTER — Other Ambulatory Visit (HOSPITAL_COMMUNITY): Payer: Self-pay

## 2023-04-15 ENCOUNTER — Other Ambulatory Visit (HOSPITAL_COMMUNITY): Payer: Self-pay

## 2023-04-18 ENCOUNTER — Encounter (HOSPITAL_COMMUNITY): Payer: Self-pay

## 2023-04-18 ENCOUNTER — Other Ambulatory Visit (HOSPITAL_COMMUNITY): Payer: Self-pay

## 2023-04-18 ENCOUNTER — Emergency Department (HOSPITAL_COMMUNITY)
Admission: EM | Admit: 2023-04-18 | Discharge: 2023-04-18 | Disposition: A | Discharge status: Home / Self Care | Payer: Medicaid Other | Attending: Emergency Medicine | Admitting: Emergency Medicine

## 2023-04-18 ENCOUNTER — Other Ambulatory Visit: Payer: Self-pay

## 2023-04-18 DIAGNOSIS — J029 Acute pharyngitis, unspecified: Secondary | ICD-10-CM | POA: Diagnosis present

## 2023-04-18 DIAGNOSIS — H9201 Otalgia, right ear: Secondary | ICD-10-CM | POA: Insufficient documentation

## 2023-04-18 DIAGNOSIS — Z7982 Long term (current) use of aspirin: Secondary | ICD-10-CM | POA: Diagnosis not present

## 2023-04-18 DIAGNOSIS — J039 Acute tonsillitis, unspecified: Secondary | ICD-10-CM | POA: Diagnosis not present

## 2023-04-18 MED ORDER — AMOXICILLIN 500 MG PO CAPS
500.0000 mg | ORAL_CAPSULE | Freq: Three times a day (TID) | ORAL | 0 refills | Status: DC
  Filled 2023-04-18: qty 21, 7d supply, fill #0

## 2023-04-18 NOTE — Discharge Instructions (Signed)
Contact a health care provider if: You notice large, tender lumps in your neck that were not there before. You have a fever that does not go away after 2-3 days. You develop a rash. You cough up a green, yellow-brown, or bloody substance. You cannot swallow liquids or food for 24 hours. Only one of your tonsils is swollen. Get help right away if: You develop any new symptoms, such as vomiting, severe headache, stiff neck, chest pain, trouble breathing, or trouble swallowing. You have severe throat pain along with drooling or voice changes. You have severe pain that is not controlled with medicines. You cannot fully open your mouth. You develop redness, swelling, or severe pain anywhere in your neck. 

## 2023-04-18 NOTE — ED Triage Notes (Signed)
Patient presented to ER for right sided ear and throat pain. Patient denies fever.

## 2023-04-18 NOTE — ED Provider Notes (Signed)
[place] EMERGENCY DEPARTMENT AT [facility] Provider Note   CSN: [id number] Arrival date & time: 04/18/23  1419     History  Chief Complaint  Patient presents with   Otalgia    Eric Larson is a 62 y.o. male presents with right ear pain and right sore throat.  Patient reports pain began yesterday.  He has pain with swallowing on the right side.  He also has pain in his right ear.  He states that last time this happened he got treated with penicllin. He he denies inability to swallow, changes in hearing, drainage from the ear.   Otalgia      Home Medications Prior to Admission medications   Medication Sig Start Date End Date Taking? Authorizing Provider  allopurinol (ZYLOPRIM) 300 MG tablet Take 300 mg by mouth daily as needed (Gout).    [provider]  aspirin EC 81 MG tablet Take 1 tablet (81 mg total) by mouth daily. Swallow whole. 05/01/22   Laurey Morale, MD  Blood Glucose Monitoring Suppl (TRUE METRIX METER) W/DEVICE KIT 1 each by Does not apply route 3 (three) times daily. 08/25/15   Quentin Angst, MD  carvedilol (COREG) 12.5 MG tablet Take 1.5 tablets (18.75 mg total) by mouth 2 (two) times daily. 01/28/23   Laurey Morale, MD  dapagliflozin propanediol (FARXIGA) 10 MG TABS tablet Take 1 tablet (10 mg total) by mouth daily. 01/28/23   Laurey Morale, MD  glucose blood test strip Use as instructed 11/18/16   Lizbeth Bark, FNP  hydrALAZINE (APRESOLINE) 50 MG tablet Take 1 tablet (50 mg total) by mouth 3 (three) times daily. 01/28/23   Laurey Morale, MD  isosorbide mononitrate (IMDUR) 30 MG 24 hr tablet Take 1 tablet (30 mg total) by mouth daily. 01/28/23   Laurey Morale, MD  levothyroxine (SYNTHROID) 75 MCG tablet Take 1 tablet (75 mcg total) by mouth daily. 07/18/22   Jacklynn Ganong, FNP  levothyroxine (SYNTHROID) 75 MCG tablet Take 1 tablet (75 mcg total) by mouth in the morning on an empty stomach 04/04/23   Laurey Morale, MD  metFORMIN (GLUCOPHAGE) 1000 MG tablet Take 1,000 mg by mouth 2 (two) times daily with a meal.    [provider]  mexiletine (MEXITIL) 150 MG capsule Take 1 capsule (150 mg total) by mouth every 12 (twelve) hours. NEED OV. 01/28/23   Laurey Morale, MD  pantoprazole (PROTONIX) 40 MG tablet Take 40 mg by mouth daily.    [provider]  rosuvastatin (CRESTOR) 20 MG tablet Take 1 tablet (20 mg total) by mouth daily. 01/28/23   Laurey Morale, MD  sacubitril-valsartan (ENTRESTO) 97-103 MG Take 1 tablet by mouth 2 (two) times daily. 01/28/23   Laurey Morale, MD  spironolactone (ALDACTONE) 25 MG tablet Take 1 tablet (25 mg total) by mouth daily. 01/28/23   Laurey Morale, MD      Allergies    Patient has no known allergies.    Review of Systems   Review of Systems  HENT:  Positive for ear pain.     Physical Exam Updated Vital Signs BP 139/76 (BP Location: Right Arm)   Pulse 74   Temp 98.1 F (36.7 C) (Oral)   Resp 18   Ht 6' (1.829 m)   Wt 91 kg   SpO2 95%   BMI 27.21 kg/m  Physical Exam Vitals and nursing note reviewed.  Constitutional:  General: He is not in acute distress.    Appearance: He is well-developed. He is not diaphoretic.  HENT:     Head: Normocephalic and atraumatic.     Right Ear: Tympanic membrane and ear canal normal.     Left Ear: Tympanic membrane and ear canal normal.     Mouth/Throat:     Pharynx: Uvula midline. Posterior oropharyngeal erythema present.     Tonsils: Tonsillar exudate present. 2+ on the right. 1+ on the left.  Eyes:     General: No scleral icterus.    Conjunctiva/sclera: Conjunctivae normal.  Cardiovascular:     Rate and Rhythm: Normal rate and regular rhythm.     Heart sounds: Normal heart sounds.  Pulmonary:     Effort: Pulmonary effort is normal. No respiratory distress.     Breath sounds: Normal breath sounds.  Abdominal:     Palpations: Abdomen is soft.     Tenderness: There is no abdominal  tenderness.  Musculoskeletal:     Cervical back: Normal range of motion and neck supple.  Skin:    General: Skin is warm and dry.  Neurological:     Mental Status: He is alert.  Psychiatric:        Behavior: Behavior normal.     ED Results / Procedures / Treatments   Labs (all labs ordered are listed, but only abnormal results are displayed) Labs Reviewed - No data to display  EKG None  Radiology No results found.  Procedures Procedures    Medications Ordered in ED Medications - No data to display  ED Course/ Medical Decision Making/ A&P                                 Medical Decision Making  Patient here with complaint of sore throat.  Differential diagnosis includes strep pharyngitis, no signs or symptoms of peritonsillar abscess, deep space infection of the neck.  Right greater than left exudative tonsillitis.  I suspect pain is due to referred tonsillitis to the right ear.  No evidence of acute otitis media, discharged with amoxicillin, discussed outpatient follow-up and return precaution.        Final Clinical Impression(s) / ED Diagnoses Final diagnoses:  None    Rx / DC Orders ED Discharge Orders     None         Arthor Captain, PA-C 04/18/23 1555    Gerhard Munch, MD 04/18/23 1630

## 2023-05-07 ENCOUNTER — Encounter (HOSPITAL_COMMUNITY): Payer: Self-pay | Admitting: Cardiology

## 2023-05-07 ENCOUNTER — Ambulatory Visit (HOSPITAL_COMMUNITY)
Admission: RE | Admit: 2023-05-07 | Discharge: 2023-05-07 | Disposition: A | Discharge status: Home / Self Care | Payer: Medicaid Other | Source: Ambulatory Visit | Attending: Cardiology | Admitting: Cardiology

## 2023-05-07 ENCOUNTER — Other Ambulatory Visit (HOSPITAL_COMMUNITY): Payer: Self-pay

## 2023-05-07 VITALS — BP 140/80 | HR 62 | Wt 203.0 lb

## 2023-05-07 DIAGNOSIS — I2510 Atherosclerotic heart disease of native coronary artery without angina pectoris: Secondary | ICD-10-CM | POA: Insufficient documentation

## 2023-05-07 DIAGNOSIS — E1122 Type 2 diabetes mellitus with diabetic chronic kidney disease: Secondary | ICD-10-CM | POA: Diagnosis not present

## 2023-05-07 DIAGNOSIS — I493 Ventricular premature depolarization: Secondary | ICD-10-CM | POA: Diagnosis not present

## 2023-05-07 DIAGNOSIS — I130 Hypertensive heart and chronic kidney disease with heart failure and stage 1 through stage 4 chronic kidney disease, or unspecified chronic kidney disease: Secondary | ICD-10-CM | POA: Diagnosis not present

## 2023-05-07 DIAGNOSIS — I340 Nonrheumatic mitral (valve) insufficiency: Secondary | ICD-10-CM | POA: Diagnosis not present

## 2023-05-07 DIAGNOSIS — I110 Hypertensive heart disease with heart failure: Secondary | ICD-10-CM | POA: Diagnosis present

## 2023-05-07 DIAGNOSIS — I5022 Chronic systolic (congestive) heart failure: Secondary | ICD-10-CM | POA: Diagnosis not present

## 2023-05-07 DIAGNOSIS — R9431 Abnormal electrocardiogram [ECG] [EKG]: Secondary | ICD-10-CM | POA: Diagnosis not present

## 2023-05-07 DIAGNOSIS — N183 Chronic kidney disease, stage 3 unspecified: Secondary | ICD-10-CM | POA: Diagnosis not present

## 2023-05-07 LAB — BASIC METABOLIC PANEL
Anion gap: 9 (ref 5–15)
BUN: 18 mg/dL (ref 8–23)
CO2: 23 mmol/L (ref 22–32)
Calcium: 9.1 mg/dL (ref 8.9–10.3)
Chloride: 107 mmol/L (ref 98–111)
Creatinine, Ser: 1.45 mg/dL — ABNORMAL HIGH (ref 0.61–1.24)
GFR, Estimated: 54 mL/min — ABNORMAL LOW (ref >60)
Glucose, Bld: 86 mg/dL (ref 70–99)
Potassium: 3.9 mmol/L (ref 3.5–5.1)
Sodium: 139 mmol/L (ref 135–145)

## 2023-05-07 LAB — LIPID PANEL
Cholesterol: 134 mg/dL (ref 0–200)
HDL: 34 mg/dL — ABNORMAL LOW (ref >40)
LDL Cholesterol: 67 mg/dL (ref 0–99)
Total CHOL/HDL Ratio: 3.9 ratio
Triglycerides: 164 mg/dL — ABNORMAL HIGH (ref <150)
VLDL: 33 mg/dL (ref 0–40)

## 2023-05-07 LAB — BRAIN NATRIURETIC PEPTIDE: B Natriuretic Peptide: 127.8 pg/mL — ABNORMAL HIGH (ref 0.0–100.0)

## 2023-05-07 MED ORDER — CARVEDILOL 25 MG PO TABS
25.0000 mg | ORAL_TABLET | Freq: Two times a day (BID) | ORAL | 6 refills | Status: DC
  Filled 2023-05-07: qty 60, 30d supply, fill #0
  Filled 2023-06-19: qty 60, 30d supply, fill #1
  Filled 2023-08-15: qty 60, 30d supply, fill #2
  Filled 2023-10-01: qty 60, 30d supply, fill #3
  Filled 2023-10-28: qty 60, 30d supply, fill #4
  Filled 2024-01-06: qty 60, 30d supply, fill #5
  Filled 2024-02-10 – 2024-03-23 (×3): qty 60, 30d supply, fill #6

## 2023-05-07 NOTE — Patient Instructions (Signed)
Medication Changes:  INCREASE CARVEDILOL TO 25MG  TWICE DAILY   Lab Work:  Labs done today, your results will be available in MyChart, we will contact you for abnormal readings.   Testing/Procedures:   Cardiac Sarcoidosis/Inflammation PET Scan  Food Diary Name: _____________________________ Please fill in EXACTLY what you have eaten and when for 24 hours PRIOR to your test date.  Time Food/Drink Comments  Breakfast                Lunch                Dinner                Snacks                 DO NOT EXERCISE THE DAY BEFORE YOUR TEST DO NOT EAT AFTER 5 PM THE DAY BEFORE YOUR TEST.  ON THE DAY OF YOUR TEST, DO NOT EAT ANY FOOD AND ONLY DRINK CLEAR WATER! PLEASE BRING THIS FOOD DIARY WITH YOU TO YOUR APPOINTMENT   Cardiac Sarcoidosis/Inflammation PET Scan Patient Instructions  Please report to Radiology at the [facility] Main Entrance 15 minutes early for your test.  [address] [place], [place] [address] BRING FOOD DIARY WITH YOU TO THIS APPOINTMENT For 24 hours before the test: Do not exercise! Do not eat after 5 pm the day before your test! To make sure that your scanning results are accurate, you MUST follow the sarcoid prep meal diet starting the day before your PET scan. This diet involves eating no carbohydrates 24 hours before the test.  You will keep a log of all that you eat the day before your test. If you have questions or do not understand this diet, please call [phone] for more information. If you are unable to follow this diet, please discuss an alternative strategy with the coordinator.  If you are diabetic, continue your diabetes medications as usual on the day before until you begin to fast. NO DIABETES MEDICATIONS ONCE YOU BEGIN TO FAST. What foods can I eat the day before my test?  Drink only water or black coffee (WITHOUT sugar, artificial sweetener, cream, or milk). Eggs (prepared without milk or cheese)  Meat that is  either broiled or pan fried in butter WITHOUT breading (chicken, [place], bacon, meat-only sausage, hamburger, steak, fish) Butter, salt & pepper What foods must I AVOID the day before my test?  Do not consume alcoholic beverages, sodas, fruit juice, coffee creamer, or sports drinks  Do not eat vegetables, beans, nuts, fruits, juices, bread, grains, rice, pasta, potatoes, or any baked goods Do not eat dairy products (milk, cheese, etc.)  Do not eat mayonnaise, ketchup, tartar sauce, mustard, or other condiments Do not add sugar, artificial sweeteners, or Splenda (sucralose) to foods or drinks  Do not eat breaded foods (like fried chicken)  Do not eat sweets, candy, gum, sweetened cough drops, lozenges, or sugar  Do not eat sweetened, grilled, or cured meats or meat with carbohydrate-containing additives (some sausages, ham, sweetened bacon) Suggested items for breakfast, lunch, or dinner:  Breakfast  3 to 5 fatty sausage links fried in butter. 3 to 5 bacon strips.  3 eggs pan fried in butter (no milk or cheese).  Lunch/Dinner  2 hamburger patties fried in butter. Chicken or fatty fish pan fried in butter. No breading. 8 oz. fatty steak pan fried in butter.  Beverages  Drink only water or black coffee. DO NOT ADD  SUGAR, ARTIFICIAL SWEETENER, CREAM, OR MILK   For more information and frequently asked questions, please visit our website : [email or web address]    Follow-Up in: 4 MONTHS WITH APP AS SCHEDULED   At the [facility], you and your health needs are our priority. We have a designated team specialized in the treatment of Heart Failure. This Care Team includes your primary Heart Failure Specialized Cardiologist (physician), Advanced Practice Providers (APPs- Physician Assistants and Nurse Practitioners), and Pharmacist who all work together to provide you with the care you need, when you need it.   You may see any of the following providers on your  designated Care Team at your next follow up:  Dr. Arvilla Meres Dr. Marca Ancona Dr. Marcos Eke, NP Robbie Lis, [place] [facility] [place], [place] Brynda Peon, NP Karle Plumber, PharmD   Please be sure to bring in all your medications bottles to every appointment.   Need to Contact us:  If you have any questions or concerns before your next appointment please send [place] a message through [place] or call our office at [phone].    TO LEAVE A MESSAGE FOR THE NURSE SELECT OPTION 2, PLEASE LEAVE A MESSAGE INCLUDING: YOUR NAME DATE OF BIRTH CALL BACK NUMBER REASON FOR CALL**this is important as we prioritize the call backs  YOU WILL RECEIVE A CALL BACK THE SAME DAY AS LONG AS YOU CALL BEFORE 4:00 PM

## 2023-05-07 NOTE — Progress Notes (Signed)
ADVANCED HF CLINIC NOTE  Primary Care: Hortencia Conradi, NP HF Cardiologist: Dr. 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  Admitted 5/23 with new acute heart failure. Given IV lasix. Echo showed EF 15-20% range with mildly dilated LV, moderately dilated RV with severely decreased systolic function, moderate-severe functional mitral regurgitation, dilated IVC. Underwent R/LHC showing mildly elevated filling pressures, low CO and 70% proximal LAD stenosis, 90% distal LAD stenosis,  80% stenosis proximally in a moderate D1. Films reviewed with Dr. [place] and decided not to intervene in the absence of chest pain and presence of diffuse cardiomyopathy, out of proportion to known disease. Started on milrinone and continued with diuresis. Drips weaned off and GDMT titrated. Started on mexiletine with frequent PVCs. Discharged home, weight 201 lbs.  Echo 8/23 showed EF 30-35%, mild LV dilation, moderate RV dysfunction with moderate MR, IVC normal.   Cardiac MRI in 11/23 showed LV EF 49% with moderate LVH, RV EF 47%, severe MR by regurgitant fraction (41%) but not visually, noncoronary LGE noted in the mid-wall basal septum, mid-wall basal-mid inferior wall, mid-wall basal anterolateral wall.   Zio monitor (11/23) showed 7.7% PVCs, Coreg was increased.   Follow up 12/23, stable NYHA II symptoms. Arranged for TEE to evaluate etiology and severity of MR.  TEE in 4/24 showed EF 45-50%, mild LVH, mild MR, normal RV size/systolic function.  .   Today he returns for HF follow up.  Doing well in general.  Able to walk 4 miles without problems.  No chest pain.  No lightheadedness.  No exertional dyspnea.  Does ok with hills and stairs.  Weight stable.   ECG (personally reviewed): NSR with anterolateral TWIs  Labs (5/23): K 4.4, creatinine 1.82, hgb 12.5 Labs (7/23): K 4.1, creatinine  1.56 Labs (8/23): K 4.1, creatinine 1.28 Labs (10/23): K 4.2, creatinine 1.59 Labs (11/23): NT-pro-BNP 319, K 3.6, creatinine 1.28 Labs (12/23): K 4.0, creatinine 1.40 Labs (3/24): K 4.7, creatinine 1.53 => 1.36, LDL 67 Labs (5/24): K 3.9, creatinine 1.39, BNP 184  PMH: 1. 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   - Echo (5/23): EF 15-20%, moderately dilated RV with severely decreased systolic function, moderate-severe functional mitral regurgitation.  - RHC/LHC (5/23): Mean RA 11, PA 40/21, mean PCWP 23, CI 1.89; 70% proximal LAD stenosis, 90% distal LAD stenosis,  80% stenosis proximally in a moderate D1.  - Echo (8/23): EF 30-35%, mild LV dilation, moderate RV dysfunction with moderate MR, IVC normal.  - Cardiac MRI in 11/23 showed LV EF 49% with moderate LVH, RV EF 47%, severe MR by regurgitant fraction (41%) but not visually, noncoronary LGE noted in the mid-wall basal septum, mid-wall basal-mid inferior wall, mid-wall basal anterolateral wall.  - TEE (4/24): EF 45-50%, mild LVH, mild MR, normal RV size/systolic function.  2. CAD: LHC (5/23) with 70% proximal LAD stenosis, 90% distal LAD stenosis,  80% stenosis proximally in a moderate D1. Managed medically.  3. HTN 4. DM2 5. 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 Gout 7. Hypothyroidism 8. PVCs: Zio monitor 11/23 with 7.7% PVCs.   Review of Systems: All systems reviewed and negative except as per HPI.   Current Outpatient Medications  Medication Sig Dispense Refill   allopurinol (ZYLOPRIM) 300 MG tablet Take 300 mg by mouth daily as needed (Gout).     amoxicillin (AMOXIL) 500 MG capsule Take 1 capsule (  500 mg total) by mouth 3 (three) times daily. 21 capsule 0   aspirin EC 81 MG tablet Take 1 tablet (81 mg total) by mouth daily. Swallow whole. 30 tablet    Blood Glucose Monitoring Suppl (TRUE METRIX METER) W/DEVICE KIT 1 each by Does not apply route 3 (three) times daily. 1 kit 0   dapagliflozin  propanediol (FARXIGA) 10 MG TABS tablet Take 1 tablet (10 mg total) by mouth daily. 90 tablet 3   glucose blood test strip Use as instructed 100 each 12   hydrALAZINE (APRESOLINE) 50 MG tablet Take 1 tablet (50 mg total) by mouth 3 (three) times daily. 270 tablet 3   isosorbide mononitrate (IMDUR) 30 MG 24 hr tablet Take 1 tablet (30 mg total) by mouth daily. 90 tablet 3   levothyroxine (SYNTHROID) 75 MCG tablet Take 1 tablet (75 mcg total) by mouth in the morning on an empty stomach 14 tablet 0   metFORMIN (GLUCOPHAGE) 1000 MG tablet Take 1,000 mg by mouth 2 (two) times daily with a meal.     mexiletine (MEXITIL) 150 MG capsule Take 1 capsule (150 mg total) by mouth every 12 (twelve) hours. NEED OV. 180 capsule 0   pantoprazole (PROTONIX) 40 MG tablet Take 40 mg by mouth daily.     rosuvastatin (CRESTOR) 20 MG tablet Take 1 tablet (20 mg total) by mouth daily. 90 tablet 3   sacubitril-valsartan (ENTRESTO) 97-103 MG Take 1 tablet by mouth 2 (two) times daily. 60 tablet 11   spironolactone (ALDACTONE) 25 MG tablet Take 1 tablet (25 mg total) by mouth daily. 90 tablet 3   carvedilol (COREG) 25 MG tablet Take 1 tablet (25 mg total) by mouth 2 (two) times daily. 60 tablet 6   No current facility-administered medications for this encounter.   No Known Allergies  Social History   Socioeconomic History   Marital status: Single    Spouse name: Not on file   Number of children: Not on file   Years of education: Not on file   Highest education level: Not on file  Occupational History   Not on file  Tobacco Use   Smoking status: Never   Smokeless tobacco: Never  Substance and Sexual Activity   Alcohol use: No   Drug use: No   Sexual activity: Yes  Other Topics Concern   Not on file  Social History Narrative   Not on file   Social Determinants of Health   Financial Resource Strain: Not on file  Food Insecurity: Not on file  Transportation Needs: Not on file  Physical Activity: Not on  file  Stress: Not on file  Social Connections: Not on file  Intimate Partner Violence: Not on file   Family History  Problem Relation Age of Onset   Heart attack Father    Diabetes Mellitus II Neg Hx    CAD Neg Hx    BP (!) 140/80   Pulse 62   Wt 92.1 kg (203 lb)   SpO2 96%   BMI 27.53 kg/m   Wt Readings from Last 3 Encounters:  05/07/23 92.1 kg (203 lb)  04/18/23 91 kg (200 lb 9.9 oz)  01/28/23 90.7 kg (200 lb)   PHYSICAL EXAM: General: NAD Neck: No JVD, no thyromegaly or thyroid nodule.  Lungs: Clear to auscultation bilaterally with normal respiratory effort. CV: Nondisplaced PMI.  Heart regular S1/S2, no S3/S4, no murmur.  No peripheral edema.  No carotid bruit.  Normal pedal pulses.  Abdomen: Soft,  nontender, no hepatosplenomegaly, no distention.  Skin: Intact without lesions or rashes.  Neurologic: Alert and oriented x 3.  Psych: Normal affect. Extremities: No clubbing or cyanosis.  HEENT: Normal.   ASSESSMENT & PLAN: 1. Chronic systolic CHF: Echo in 5/23 with EF 15-20% range with mildly dilated LV, moderately dilated RV with severely decreased systolic function, moderate-severe functional mitral regurgitation, dilated IVC.  Does not have history of ETOH or drugs.  No strong FH of cardiomyopathy.  He was noted in 5/23 hospitalization to have frequent PVCs, may contribute to CMP.  RHC in 5/23 showed elevated filling pressures and low cardiac output (CI 1.89). LHC showed moderate LAD disease, however cardiomyopathy was out of proportion to degree of CAD. 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 Echo 8/23 showed some improvement, EF up to 30-35%, despite being out of his meds x 2 weeks prior.  Cardiac MRI in 11/23 showed further improvement in LV EF to 49% with moderate LVH, RV EF 47%, severe MR by regurgitant fraction (41%) but not visually, noncoronary LGE noted in the mid-wall basal septum, mid-wall basal-mid inferior wall, mid-wall basal anterolateral wall.  MRI  concerning for possible prior myocarditis or cardiac sarcoidosis, but pattern could also be seen with a variant of hypertrophic cardiomyopathy given the moderate LVH. [facility] gene testing was negative for gene mutations associated with familial dilated cardiomyopathies and hypertrophic cardiomyopathy. TEE in 4/24 with EF 45-50% with mild MR.  NYHA class I per his report, not volume overloaded on exam.   - Continue Entresto 97/103 mg bid. BMET/BNP today.  - He does not need Lasix.  - Increase Coreg 25 mg bid.   - Continue dapagliflozin 10 mg daily.   - Continue hydralazine 50 mg tid + Imdur 30 mg daily. - Continue spironolactone 25 mg daily.  - Narrow QRS, would not be CRT candidate. EF is now out of ICD range.  - Cardiac MRI  LGE pattern could be consistent with cardiac sarcoidosis, will arrange for cardiac PET to look for active inflammation.  - 2. CAD: Mod LAD and Diag disease on 5/23 cath. The proximal LAD lesion is not critical but looks to be around 70% (moderate stenosis).  This could potentially be intervened upon, as could the 1st diagonal (would not intervene on the severe far distal LAD stenosis).  However, patient reports no chest pain and his cardiomyopathy is diffuse and not explained by the moderate LAD disease. Would manage medically for now unless he develops chest pain.  - Continue ASA + statin, check lipids.   3. CKD stage 3: suspect baseline diabetic nephropathy.  - Continue Farxiga.  4. Diabetes: Type 2. Per PCP.  5. HTN: BP mildly elevated.  - Increase Coreg as above.  6. Mitral regurgitation: Mod-severe functional MR on 5/23 echo, echo 8/23 showed moderate MR.  Cardiac MRI in 11/23 did not appear to show significant MR visually but calculated regurgitant fraction was 41%. He does not have a loud murmur.  TEE in 4/24 showed only mild MR.  7. PVCs: Frequent during 5/23 admission. Zio monitor in 11/23 (on mexiletine) showed 7.7% PVCs.  - Continue mexiletine 150 mg bid.  -  Increasing Coreg as above.   Followup in 4 months with APP.   Marca Ancona  05/07/2023

## 2023-05-13 ENCOUNTER — Other Ambulatory Visit (HOSPITAL_COMMUNITY): Payer: Self-pay

## 2023-05-13 ENCOUNTER — Other Ambulatory Visit (HOSPITAL_COMMUNITY): Payer: Self-pay | Admitting: Cardiology

## 2023-05-13 MED ORDER — LEVOTHYROXINE SODIUM 75 MCG PO TABS
75.0000 ug | ORAL_TABLET | Freq: Every morning | ORAL | 0 refills | Status: DC
  Filled 2023-05-13: qty 14, 14d supply, fill #0

## 2023-05-15 ENCOUNTER — Other Ambulatory Visit (HOSPITAL_COMMUNITY): Payer: Self-pay

## 2023-05-19 ENCOUNTER — Other Ambulatory Visit (HOSPITAL_COMMUNITY): Payer: Self-pay

## 2023-05-19 DIAGNOSIS — E782 Mixed hyperlipidemia: Secondary | ICD-10-CM | POA: Diagnosis not present

## 2023-05-19 DIAGNOSIS — I5021 Acute systolic (congestive) heart failure: Secondary | ICD-10-CM | POA: Diagnosis not present

## 2023-05-19 DIAGNOSIS — I10 Essential (primary) hypertension: Secondary | ICD-10-CM | POA: Diagnosis not present

## 2023-05-19 DIAGNOSIS — M109 Gout, unspecified: Secondary | ICD-10-CM | POA: Diagnosis not present

## 2023-05-19 DIAGNOSIS — E559 Vitamin D deficiency, unspecified: Secondary | ICD-10-CM | POA: Diagnosis not present

## 2023-05-19 DIAGNOSIS — E038 Other specified hypothyroidism: Secondary | ICD-10-CM | POA: Diagnosis not present

## 2023-05-19 DIAGNOSIS — K219 Gastro-esophageal reflux disease without esophagitis: Secondary | ICD-10-CM | POA: Diagnosis not present

## 2023-05-19 DIAGNOSIS — Z006 Encounter for examination for normal comparison and control in clinical research program: Secondary | ICD-10-CM

## 2023-05-19 DIAGNOSIS — E119 Type 2 diabetes mellitus without complications: Secondary | ICD-10-CM | POA: Diagnosis not present

## 2023-05-19 DIAGNOSIS — D518 Other vitamin B12 deficiency anemias: Secondary | ICD-10-CM | POA: Diagnosis not present

## 2023-05-19 DIAGNOSIS — E1165 Type 2 diabetes mellitus with hyperglycemia: Secondary | ICD-10-CM | POA: Diagnosis not present

## 2023-05-19 MED ORDER — COLCHICINE 0.6 MG PO TABS
ORAL_TABLET | ORAL | 0 refills | Status: DC
  Filled 2023-05-19 – 2023-06-19 (×2): qty 15, 7d supply, fill #0

## 2023-05-19 MED ORDER — LEVOTHYROXINE SODIUM 75 MCG PO TABS
75.0000 ug | ORAL_TABLET | Freq: Every morning | ORAL | 0 refills | Status: DC
  Filled 2023-05-19 – 2023-08-15 (×2): qty 90, 90d supply, fill #0

## 2023-05-19 MED ORDER — ALLOPURINOL 300 MG PO TABS
300.0000 mg | ORAL_TABLET | Freq: Every day | ORAL | 0 refills | Status: DC
  Filled 2023-05-19 – 2023-06-19 (×2): qty 90, 90d supply, fill #0

## 2023-05-19 MED ORDER — LEVOTHYROXINE SODIUM 75 MCG PO TABS
75.0000 ug | ORAL_TABLET | Freq: Every morning | ORAL | 0 refills | Status: DC
  Filled 2023-05-19: qty 90, 90d supply, fill #0
  Filled 2023-05-26: qty 100, 100d supply, fill #0
  Filled 2023-06-19: qty 90, 90d supply, fill #0

## 2023-05-19 NOTE — Research (Signed)
DETECT FOLLOW UP VISIT via PHONE - MONTH 9 (+/- 2 weeks)  VISIT DETAILS  Date of Visit: 16/Sep/2024   NYHA  NYHA Class: [x] 1 [] 2 [] 3 [] 4    CORDIO RECORDING EQUIPMENT*  Was there a change in equipment supplied by the Sponsor since last visit?      []  Yes       [x]   No  New Phone/Tablet serial number: ___________   New Phone/Tablet Model: _____________    SMOKING HISTORY  Has the subject smoked tobacco since the last visit?  []   Yes  [x]   No  Does the subject currently smoke tobacco?  []   Yes  [x]   No  If yes, how many packs per week? ______________   CONCOMITANT MEDICATIONS  Have there been any changes to the subject's medications since last visit?  If yes, please update Concomitant Medications Log.  [x]  Yes  []   No   ADVERSE EVENTS  Have any new adverse events (AEs) occurred since last visit?  If yes, please update Adverse Events Log.  []  Yes  [x]   No   ELECTROCARDIOGRAM*                                                                                                                                       []  Not Done Information will be extracted from the subject's most recent ECG in case one was conducted since the last CHF clinic visit.  ECHOCARDIOGRAPHY*                             *Upload Echocardiogram in [facility]                                                   [x]  Not Done Information will be extracted from the subjects most recent Echocardiogram in case one was conducted since the last CHF clinic visit.     Current Outpatient Medications:    allopurinol (ZYLOPRIM) 300 MG tablet, Take 300 mg by mouth daily as needed (Gout)., Disp: , Rfl:    allopurinol (ZYLOPRIM) 300 MG tablet, Take 1 tablet (300 mg total) by mouth daily., Disp: 100 tablet, Rfl: 0   amoxicillin (AMOXIL) 500 MG capsule, Take 1 capsule (500 mg total) by mouth 3 (three) times daily., Disp: 21 capsule, Rfl: 0   aspirin EC 81 MG tablet, Take 1 tablet (81 mg total) by mouth daily. Swallow whole., Disp: 30 tablet,  Rfl:    Blood Glucose Monitoring Suppl (TRUE METRIX METER) W/DEVICE KIT, 1 each by Does not apply route 3 (three) times daily., Disp: 1 kit, Rfl: 0   carvedilol (COREG) 25 MG tablet, Take 1 tablet (25 mg total) by mouth 2 (two) times daily., Disp:  60 tablet, Rfl: 6   colchicine 0.6 MG tablet, Take 1 tablet (0.6 mg total) by mouth every 8 (eight) hours on day 1 of attack, THEN 1 tablet (0.6 mg total) 2 (two) times daily until pain free., Disp: 15 tablet, Rfl: 0   dapagliflozin propanediol (FARXIGA) 10 MG TABS tablet, Take 1 tablet (10 mg total) by mouth daily., Disp: 90 tablet, Rfl: 3   glucose blood test strip, Use as instructed, Disp: 100 each, Rfl: 12   hydrALAZINE (APRESOLINE) 50 MG tablet, Take 1 tablet (50 mg total) by mouth 3 (three) times daily., Disp: 270 tablet, Rfl: 3   isosorbide mononitrate (IMDUR) 30 MG 24 hr tablet, Take 1 tablet (30 mg total) by mouth daily., Disp: 90 tablet, Rfl: 3   levothyroxine (SYNTHROID) 75 MCG tablet, Take 1 tablet (75 mcg total) by mouth every morning on an empty stomach., Disp: 100 tablet, Rfl: 0   levothyroxine (SYNTHROID) 75 MCG tablet, Take 1 tablet (75 mcg total) by mouth in the morning on an empty stomach., Disp: 100 tablet, Rfl: 0   metFORMIN (GLUCOPHAGE) 1000 MG tablet, Take 1,000 mg by mouth 2 (two) times daily with a meal., Disp: , Rfl:    mexiletine (MEXITIL) 150 MG capsule, Take 1 capsule (150 mg total) by mouth every 12 (twelve) hours. NEED OV., Disp: 180 capsule, Rfl: 0   pantoprazole (PROTONIX) 40 MG tablet, Take 40 mg by mouth daily., Disp: , Rfl:    rosuvastatin (CRESTOR) 20 MG tablet, Take 1 tablet (20 mg total) by mouth daily., Disp: 90 tablet, Rfl: 3   sacubitril-valsartan (ENTRESTO) 97-103 MG, Take 1 tablet by mouth 2 (two) times daily., Disp: 60 tablet, Rfl: 11   spironolactone (ALDACTONE) 25 MG tablet, Take 1 tablet (25 mg total) by mouth daily., Disp: 90 tablet, Rfl: 3

## 2023-05-26 ENCOUNTER — Other Ambulatory Visit (HOSPITAL_COMMUNITY): Payer: Self-pay

## 2023-06-04 ENCOUNTER — Other Ambulatory Visit: Payer: Self-pay

## 2023-06-04 ENCOUNTER — Emergency Department (HOSPITAL_COMMUNITY)
Admission: EM | Admit: 2023-06-04 | Discharge: 2023-06-04 | Disposition: A | Discharge status: Home / Self Care | Payer: Medicaid Other | Attending: Emergency Medicine | Admitting: Emergency Medicine

## 2023-06-04 ENCOUNTER — Other Ambulatory Visit (HOSPITAL_COMMUNITY): Payer: Self-pay

## 2023-06-04 DIAGNOSIS — W268XXA Contact with other sharp object(s), not elsewhere classified, initial encounter: Secondary | ICD-10-CM | POA: Diagnosis not present

## 2023-06-04 DIAGNOSIS — Z7982 Long term (current) use of aspirin: Secondary | ICD-10-CM | POA: Insufficient documentation

## 2023-06-04 DIAGNOSIS — S61215A Laceration without foreign body of left ring finger without damage to nail, initial encounter: Secondary | ICD-10-CM | POA: Diagnosis not present

## 2023-06-04 DIAGNOSIS — S6992XA Unspecified injury of left wrist, hand and finger(s), initial encounter: Secondary | ICD-10-CM | POA: Diagnosis present

## 2023-06-04 DIAGNOSIS — Z23 Encounter for immunization: Secondary | ICD-10-CM | POA: Insufficient documentation

## 2023-06-04 MED ORDER — TETANUS-DIPHTH-ACELL PERTUSSIS 5-2.5-18.5 LF-MCG/0.5 IM SUSY
0.5000 mL | PREFILLED_SYRINGE | Freq: Once | INTRAMUSCULAR | Status: AC
  Administered 2023-06-04: 0.5 mL via INTRAMUSCULAR
  Filled 2023-06-04: qty 0.5

## 2023-06-04 MED ORDER — CEPHALEXIN 500 MG PO CAPS
500.0000 mg | ORAL_CAPSULE | Freq: Two times a day (BID) | ORAL | 0 refills | Status: DC
  Filled 2023-06-04 – 2023-06-19 (×2): qty 10, 5d supply, fill #0

## 2023-06-04 NOTE — Discharge Instructions (Signed)
Follow-up with your family doctor next week for recheck. 

## 2023-06-04 NOTE — ED Triage Notes (Signed)
Pt c/o L ring finger lac from yesterday. Unknown tetanus shot

## 2023-06-04 NOTE — ED Provider Notes (Signed)
[place] EMERGENCY DEPARTMENT AT [facility] Provider Note   CSN: [id number] Arrival date & time: 06/04/23  4098     History {Add pertinent medical, surgical, social history, OB history to HPI:1} Chief Complaint  Patient presents with   Finger Injury    Eric Larson is a 62 y.o. male.  Patient cut his left ring finger yesterday.   Hand Injury      Home Medications Prior to Admission medications   Medication Sig Start Date End Date Taking? Authorizing Provider  allopurinol (ZYLOPRIM) 300 MG tablet Take 300 mg by mouth daily as needed (Gout).    [provider]  allopurinol (ZYLOPRIM) 300 MG tablet Take 1 tablet (300 mg total) by mouth daily. 05/19/23     amoxicillin (AMOXIL) 500 MG capsule Take 1 capsule (500 mg total) by mouth 3 (three) times daily. 04/18/23   Arthor Captain, PA-C  aspirin EC 81 MG tablet Take 1 tablet (81 mg total) by mouth daily. Swallow whole. 05/01/22   Laurey Morale, MD  Blood Glucose Monitoring Suppl (TRUE METRIX METER) W/DEVICE KIT 1 each by Does not apply route 3 (three) times daily. 08/25/15   Quentin Angst, MD  carvedilol (COREG) 25 MG tablet Take 1 tablet (25 mg total) by mouth 2 (two) times daily. 05/07/23   Laurey Morale, MD  colchicine 0.6 MG tablet Take 1 tablet (0.6 mg total) by mouth every 8 (eight) hours on day 1 of attack, THEN 1 tablet (0.6 mg total) 2 (two) times daily until pain free. 05/19/23 06/02/23    dapagliflozin propanediol (FARXIGA) 10 MG TABS tablet Take 1 tablet (10 mg total) by mouth daily. 01/28/23   Laurey Morale, MD  glucose blood test strip Use as instructed 11/18/16   Lizbeth Bark, FNP  hydrALAZINE (APRESOLINE) 50 MG tablet Take 1 tablet (50 mg total) by mouth 3 (three) times daily. 01/28/23   Laurey Morale, MD  isosorbide mononitrate (IMDUR) 30 MG 24 hr tablet Take 1 tablet (30 mg total) by mouth daily. 01/28/23   Laurey Morale, MD  levothyroxine (SYNTHROID) 75 MCG tablet Take 1  tablet (75 mcg total) by mouth every morning on an empty stomach. 05/19/23     levothyroxine (SYNTHROID) 75 MCG tablet Take 1 tablet (75 mcg total) by mouth in the morning on an empty stomach. 05/19/23     metFORMIN (GLUCOPHAGE) 1000 MG tablet Take 1,000 mg by mouth 2 (two) times daily with a meal.    [provider]  mexiletine (MEXITIL) 150 MG capsule Take 1 capsule (150 mg total) by mouth every 12 (twelve) hours. NEED OV. 01/28/23   Laurey Morale, MD  pantoprazole (PROTONIX) 40 MG tablet Take 40 mg by mouth daily.    [provider]  rosuvastatin (CRESTOR) 20 MG tablet Take 1 tablet (20 mg total) by mouth daily. 01/28/23   Laurey Morale, MD  sacubitril-valsartan (ENTRESTO) 97-103 MG Take 1 tablet by mouth 2 (two) times daily. 01/28/23   Laurey Morale, MD  spironolactone (ALDACTONE) 25 MG tablet Take 1 tablet (25 mg total) by mouth daily. 01/28/23   Laurey Morale, MD      Allergies    Patient has no known allergies.    Review of Systems   Review of Systems  Physical Exam Updated Vital Signs BP (!) 165/97 (BP Location: Right Arm)   Pulse (!) 51   Temp 98.1 F (36.7 C) (Oral)   Resp 18  Ht 6' (1.829 m)   Wt 93 kg   SpO2 97%   BMI 27.80 kg/m  Physical Exam  ED Results / Procedures / Treatments   Labs (all labs ordered are listed, but only abnormal results are displayed) Labs Reviewed - No data to display  EKG None  Radiology No results found.  Procedures Procedures  {Document cardiac monitor, telemetry assessment procedure when appropriate:1}  Medications Ordered in ED Medications  Tdap (BOOSTRIX) injection 0.5 mL (has no administration in time range)    ED Course/ Medical Decision Making/ A&P   {   Click here for ABCD2, HEART and other calculatorsREFRESH Note before signing :1}                              Medical Decision Making Risk Prescription drug management.   Patient with healing to left ring finger laceration.  He is put on  Keflex and will follow-up with PCP  {Document critical care time when appropriate:1} {Document review of labs and clinical decision tools ie heart score, Chads2Vasc2 etc:1}  {Document your independent review of radiology images, and any outside records:1} {Document your discussion with family members, caretakers, and with consultants:1} {Document social determinants of health affecting pt's care:1} {Document your decision making why or why not admission, treatments were needed:1} Final Clinical Impression(s) / ED Diagnoses Final diagnoses:  None    Rx / DC Orders ED Discharge Orders     None

## 2023-06-05 ENCOUNTER — Other Ambulatory Visit (HOSPITAL_COMMUNITY): Payer: Self-pay

## 2023-06-19 ENCOUNTER — Other Ambulatory Visit (HOSPITAL_COMMUNITY): Payer: Self-pay

## 2023-06-19 ENCOUNTER — Other Ambulatory Visit (HOSPITAL_COMMUNITY): Payer: Self-pay | Admitting: Cardiology

## 2023-06-19 MED ORDER — METFORMIN HCL 1000 MG PO TABS
1000.0000 mg | ORAL_TABLET | Freq: Two times a day (BID) | ORAL | 0 refills | Status: DC
  Filled 2023-06-19: qty 180, 90d supply, fill #0

## 2023-06-19 MED ORDER — LEVOTHYROXINE SODIUM 75 MCG PO TABS
75.0000 ug | ORAL_TABLET | Freq: Every morning | ORAL | 0 refills | Status: AC
  Filled 2023-06-19 – 2023-10-01 (×2): qty 90, 90d supply, fill #0
  Filled 2024-01-06: qty 10, 10d supply, fill #1

## 2023-06-19 MED ORDER — PANTOPRAZOLE SODIUM 40 MG PO TBEC
40.0000 mg | DELAYED_RELEASE_TABLET | Freq: Every day | ORAL | 0 refills | Status: DC
  Filled 2023-06-19: qty 90, 90d supply, fill #0

## 2023-06-19 MED ORDER — MEXILETINE HCL 150 MG PO CAPS
150.0000 mg | ORAL_CAPSULE | Freq: Two times a day (BID) | ORAL | 0 refills | Status: DC
  Filled 2023-06-19: qty 28, 14d supply, fill #0

## 2023-07-03 ENCOUNTER — Telehealth (HOSPITAL_COMMUNITY): Payer: Self-pay | Admitting: *Deleted

## 2023-08-04 ENCOUNTER — Other Ambulatory Visit (HOSPITAL_COMMUNITY): Payer: Self-pay

## 2023-08-05 ENCOUNTER — Telehealth (HOSPITAL_COMMUNITY): Payer: Self-pay | Admitting: *Deleted

## 2023-08-05 NOTE — Telephone Encounter (Signed)
Received call from patient regarding upcoming cardiac imaging study; pt verbalizes understanding of appt date/time, parking situation and where to check in, pre-test NPO status and medications ordered, and verified current allergies; name and call back number provided for further questions should they arise Johney Frame RN Navigator Cardiac Imaging [facility] [phone] office [phone] cell  Patient verbalized understanding of diet prep.

## 2023-08-05 NOTE — Telephone Encounter (Signed)
Attempted to call patient regarding upcoming cardiac PET appointment. Left message on voicemail with name and callback number Johney Frame RN Navigator Cardiac Imaging [facility] [phone] Office

## 2023-08-07 ENCOUNTER — Encounter (HOSPITAL_COMMUNITY): Payer: Self-pay

## 2023-08-07 ENCOUNTER — Encounter (HOSPITAL_COMMUNITY)
Admission: RE | Admit: 2023-08-07 | Discharge: 2023-08-07 | Disposition: A | Discharge status: Home / Self Care | Payer: Medicaid Other | Source: Ambulatory Visit | Attending: Cardiology | Admitting: Cardiology

## 2023-08-07 DIAGNOSIS — I5022 Chronic systolic (congestive) heart failure: Secondary | ICD-10-CM | POA: Insufficient documentation

## 2023-08-15 ENCOUNTER — Other Ambulatory Visit: Payer: Self-pay

## 2023-08-15 ENCOUNTER — Other Ambulatory Visit (HOSPITAL_COMMUNITY): Payer: Self-pay | Admitting: Cardiology

## 2023-08-15 ENCOUNTER — Other Ambulatory Visit (HOSPITAL_COMMUNITY): Payer: Self-pay

## 2023-08-18 ENCOUNTER — Other Ambulatory Visit (HOSPITAL_COMMUNITY): Payer: Self-pay

## 2023-08-18 ENCOUNTER — Other Ambulatory Visit: Payer: Self-pay

## 2023-08-18 MED ORDER — COLCHICINE 0.6 MG PO TABS
ORAL_TABLET | ORAL | 1 refills | Status: DC
  Filled 2023-08-18: qty 15, 3d supply, fill #0
  Filled 2023-10-01: qty 15, 3d supply, fill #1

## 2023-08-18 MED ORDER — MEXILETINE HCL 150 MG PO CAPS
150.0000 mg | ORAL_CAPSULE | Freq: Two times a day (BID) | ORAL | 0 refills | Status: DC
  Filled 2023-08-18: qty 180, 90d supply, fill #0
  Filled 2023-08-18: qty 71, 35d supply, fill #0
  Filled 2023-08-18: qty 118, 59d supply, fill #0
  Filled 2023-08-18: qty 109, 55d supply, fill #0

## 2023-08-19 ENCOUNTER — Other Ambulatory Visit (HOSPITAL_COMMUNITY): Payer: Self-pay

## 2023-08-19 DIAGNOSIS — I10 Essential (primary) hypertension: Secondary | ICD-10-CM | POA: Diagnosis not present

## 2023-08-19 DIAGNOSIS — E782 Mixed hyperlipidemia: Secondary | ICD-10-CM | POA: Diagnosis not present

## 2023-08-19 DIAGNOSIS — K219 Gastro-esophageal reflux disease without esophagitis: Secondary | ICD-10-CM | POA: Diagnosis not present

## 2023-08-19 DIAGNOSIS — E038 Other specified hypothyroidism: Secondary | ICD-10-CM | POA: Diagnosis not present

## 2023-08-19 DIAGNOSIS — M109 Gout, unspecified: Secondary | ICD-10-CM | POA: Diagnosis not present

## 2023-08-19 DIAGNOSIS — I5021 Acute systolic (congestive) heart failure: Secondary | ICD-10-CM | POA: Diagnosis not present

## 2023-08-19 DIAGNOSIS — E1165 Type 2 diabetes mellitus with hyperglycemia: Secondary | ICD-10-CM | POA: Diagnosis not present

## 2023-08-21 ENCOUNTER — Encounter (HOSPITAL_COMMUNITY): Admission: RE | Admit: 2023-08-21 | Payer: Medicaid Other | Source: Ambulatory Visit

## 2023-09-12 ENCOUNTER — Encounter (HOSPITAL_COMMUNITY): Payer: Self-pay

## 2023-10-01 ENCOUNTER — Other Ambulatory Visit (HOSPITAL_COMMUNITY): Payer: Self-pay

## 2023-10-02 ENCOUNTER — Other Ambulatory Visit (HOSPITAL_COMMUNITY): Payer: Self-pay

## 2023-10-02 MED ORDER — PANTOPRAZOLE SODIUM 40 MG PO TBEC
40.0000 mg | DELAYED_RELEASE_TABLET | Freq: Every day | ORAL | 0 refills | Status: AC
  Filled 2023-10-02: qty 90, 90d supply, fill #0
  Filled 2024-01-06: qty 10, 10d supply, fill #1

## 2023-10-02 MED ORDER — METFORMIN HCL 1000 MG PO TABS
1000.0000 mg | ORAL_TABLET | Freq: Two times a day (BID) | ORAL | 0 refills | Status: AC
  Filled 2023-10-02: qty 180, 90d supply, fill #0

## 2023-10-02 MED ORDER — ALLOPURINOL 300 MG PO TABS
300.0000 mg | ORAL_TABLET | Freq: Every day | ORAL | 0 refills | Status: AC
  Filled 2023-10-02: qty 90, 90d supply, fill #0
  Filled 2024-01-06: qty 10, 10d supply, fill #1

## 2023-10-06 ENCOUNTER — Telehealth (HOSPITAL_COMMUNITY): Payer: Self-pay | Admitting: *Deleted

## 2023-10-06 NOTE — Telephone Encounter (Signed)
Attempted to call patient regarding upcoming cardiac PET appointment. Left message on voicemail with name and callback number  Oza Oberle RN Navigator Cardiac Imaging [facility] [phone] Office [phone] Cell  

## 2023-10-07 ENCOUNTER — Telehealth (HOSPITAL_COMMUNITY): Payer: Self-pay | Admitting: *Deleted

## 2023-10-07 NOTE — Telephone Encounter (Signed)
Reaching out to patient to offer assistance regarding upcoming cardiac imaging study; pt verbalizes understanding of appt date/time, parking situation and where to check in, pre-test NPO status and verified current allergies; name and call back number provided for further questions should they arise  Larey Brick RN Navigator Cardiac Imaging [facility] [phone] office [phone] cell  Patient verbalized understanding of diet instructions.

## 2023-10-08 ENCOUNTER — Encounter (HOSPITAL_COMMUNITY)
Admission: RE | Admit: 2023-10-08 | Discharge: 2023-10-08 | Disposition: A | Discharge status: Home / Self Care | Payer: Medicaid Other | Source: Ambulatory Visit | Attending: Cardiology | Admitting: Cardiology

## 2023-10-08 DIAGNOSIS — I700 Atherosclerosis of aorta: Secondary | ICD-10-CM | POA: Diagnosis not present

## 2023-10-08 DIAGNOSIS — I2510 Atherosclerotic heart disease of native coronary artery without angina pectoris: Secondary | ICD-10-CM | POA: Insufficient documentation

## 2023-10-08 DIAGNOSIS — I5022 Chronic systolic (congestive) heart failure: Secondary | ICD-10-CM | POA: Diagnosis not present

## 2023-10-08 LAB — NM PET CT MYOCARDIAL SARCOIDOSIS
LV dias vol: 169 mL (ref 62–150)
LV sys vol: 102 mL
Nuc Stress EF: 40 %
Rest Nuclear Isotope Dose: 24.1 mCi

## 2023-10-08 MED ORDER — FLUDEOXYGLUCOSE F - 18 (FDG) INJECTION
8.7300 | Freq: Once | INTRAVENOUS | Status: AC
  Administered 2023-10-08: 8.73 via INTRAVENOUS

## 2023-10-08 MED ORDER — RUBIDIUM RB82 GENERATOR (RUBYFILL)
24.0500 | PACK | Freq: Once | INTRAVENOUS | Status: AC
  Administered 2023-10-08: 24.05 via INTRAVENOUS

## 2023-10-23 ENCOUNTER — Other Ambulatory Visit: Payer: Self-pay

## 2023-10-28 ENCOUNTER — Other Ambulatory Visit (HOSPITAL_COMMUNITY): Payer: Self-pay

## 2023-10-28 MED ORDER — COLCHICINE 0.6 MG PO TABS
ORAL_TABLET | ORAL | 1 refills | Status: AC
  Filled 2023-10-28: qty 15, 2d supply, fill #0
  Filled 2024-01-06: qty 15, 7d supply, fill #1

## 2023-11-19 IMAGING — CR DG CHEST 2V
2 series · 2 of 2 positions shown · non-contrast
Comparison: 12/13/2021

CLINICAL DATA: Shortness of breath

EXAM:
CHEST - 2 VIEW

[w chest pa]
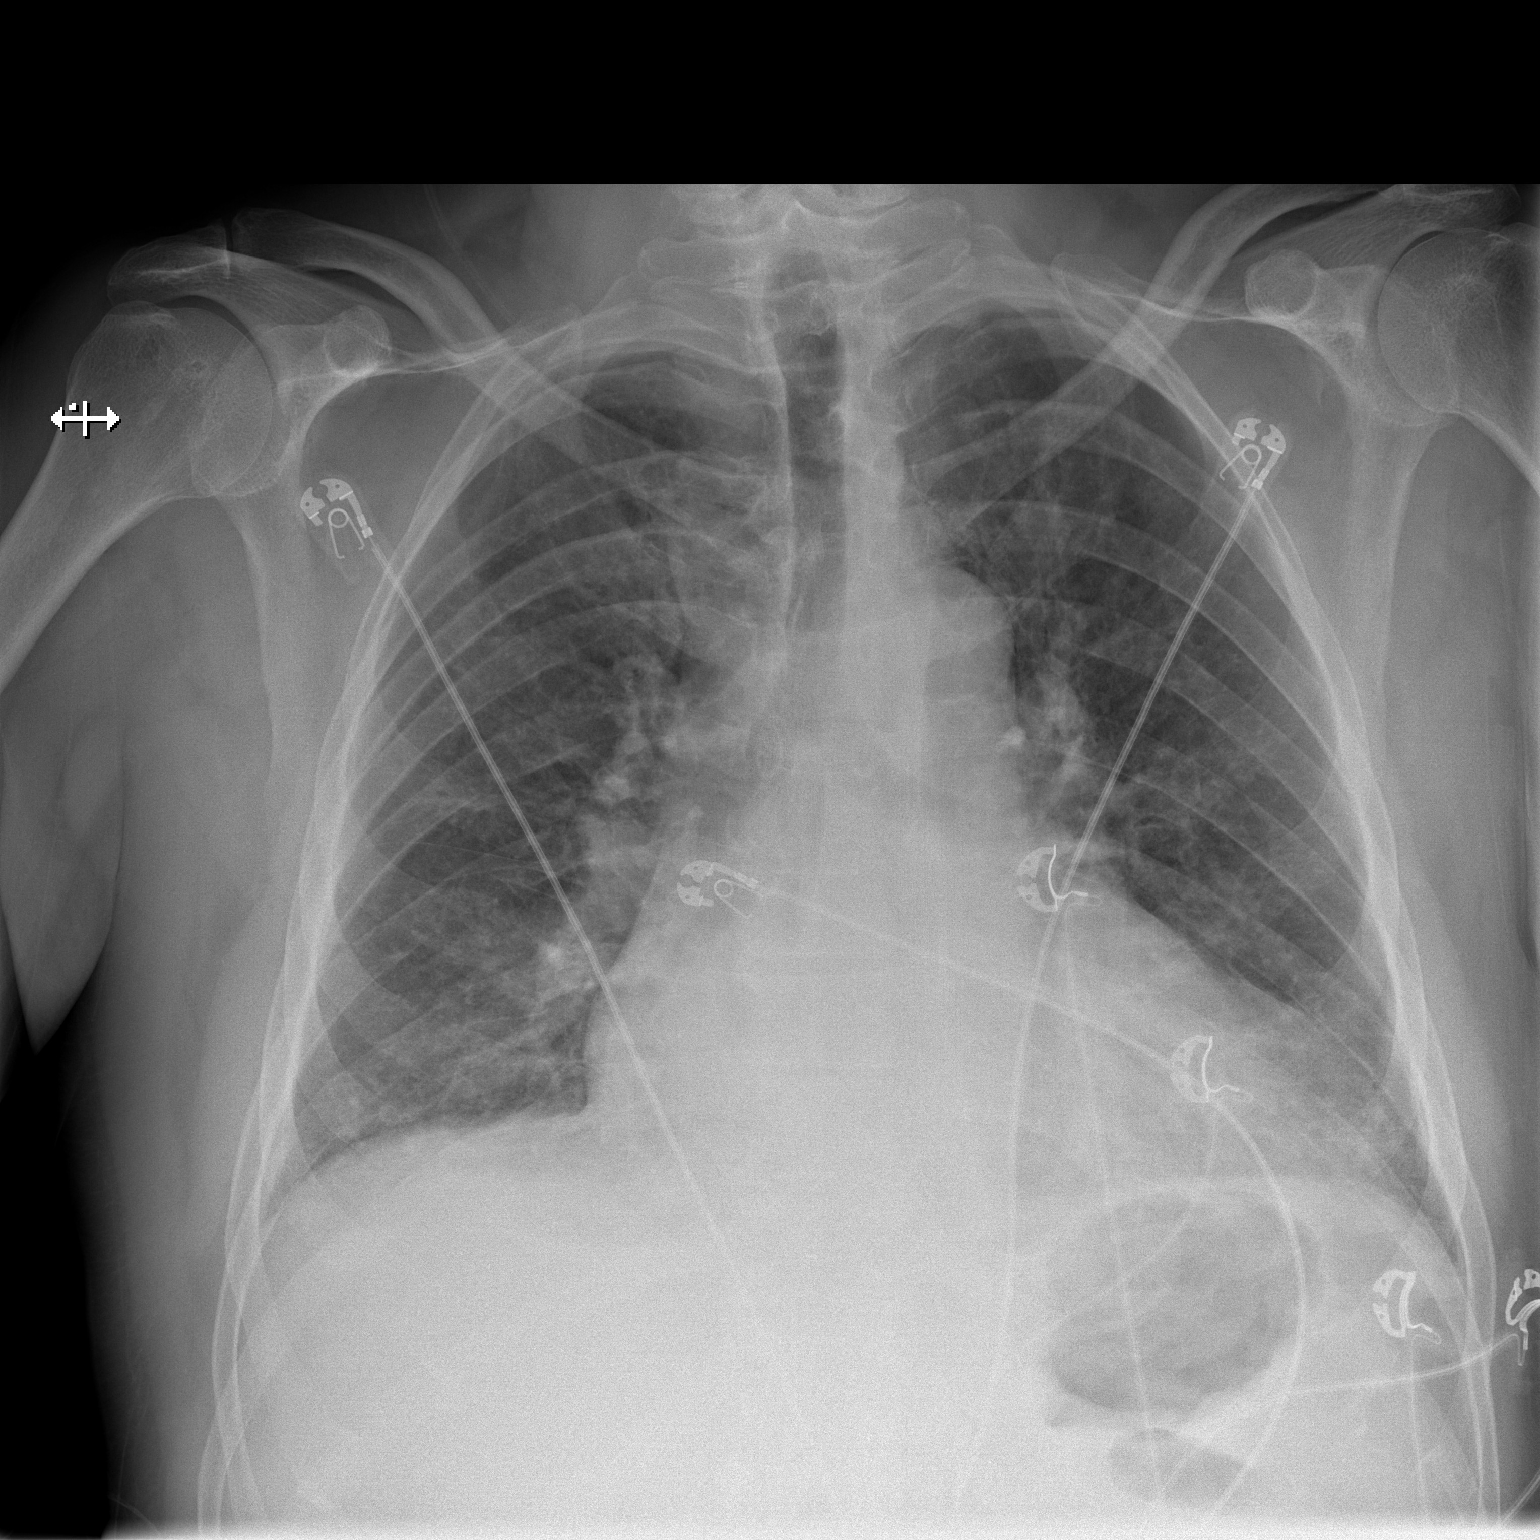

[w chest lat]
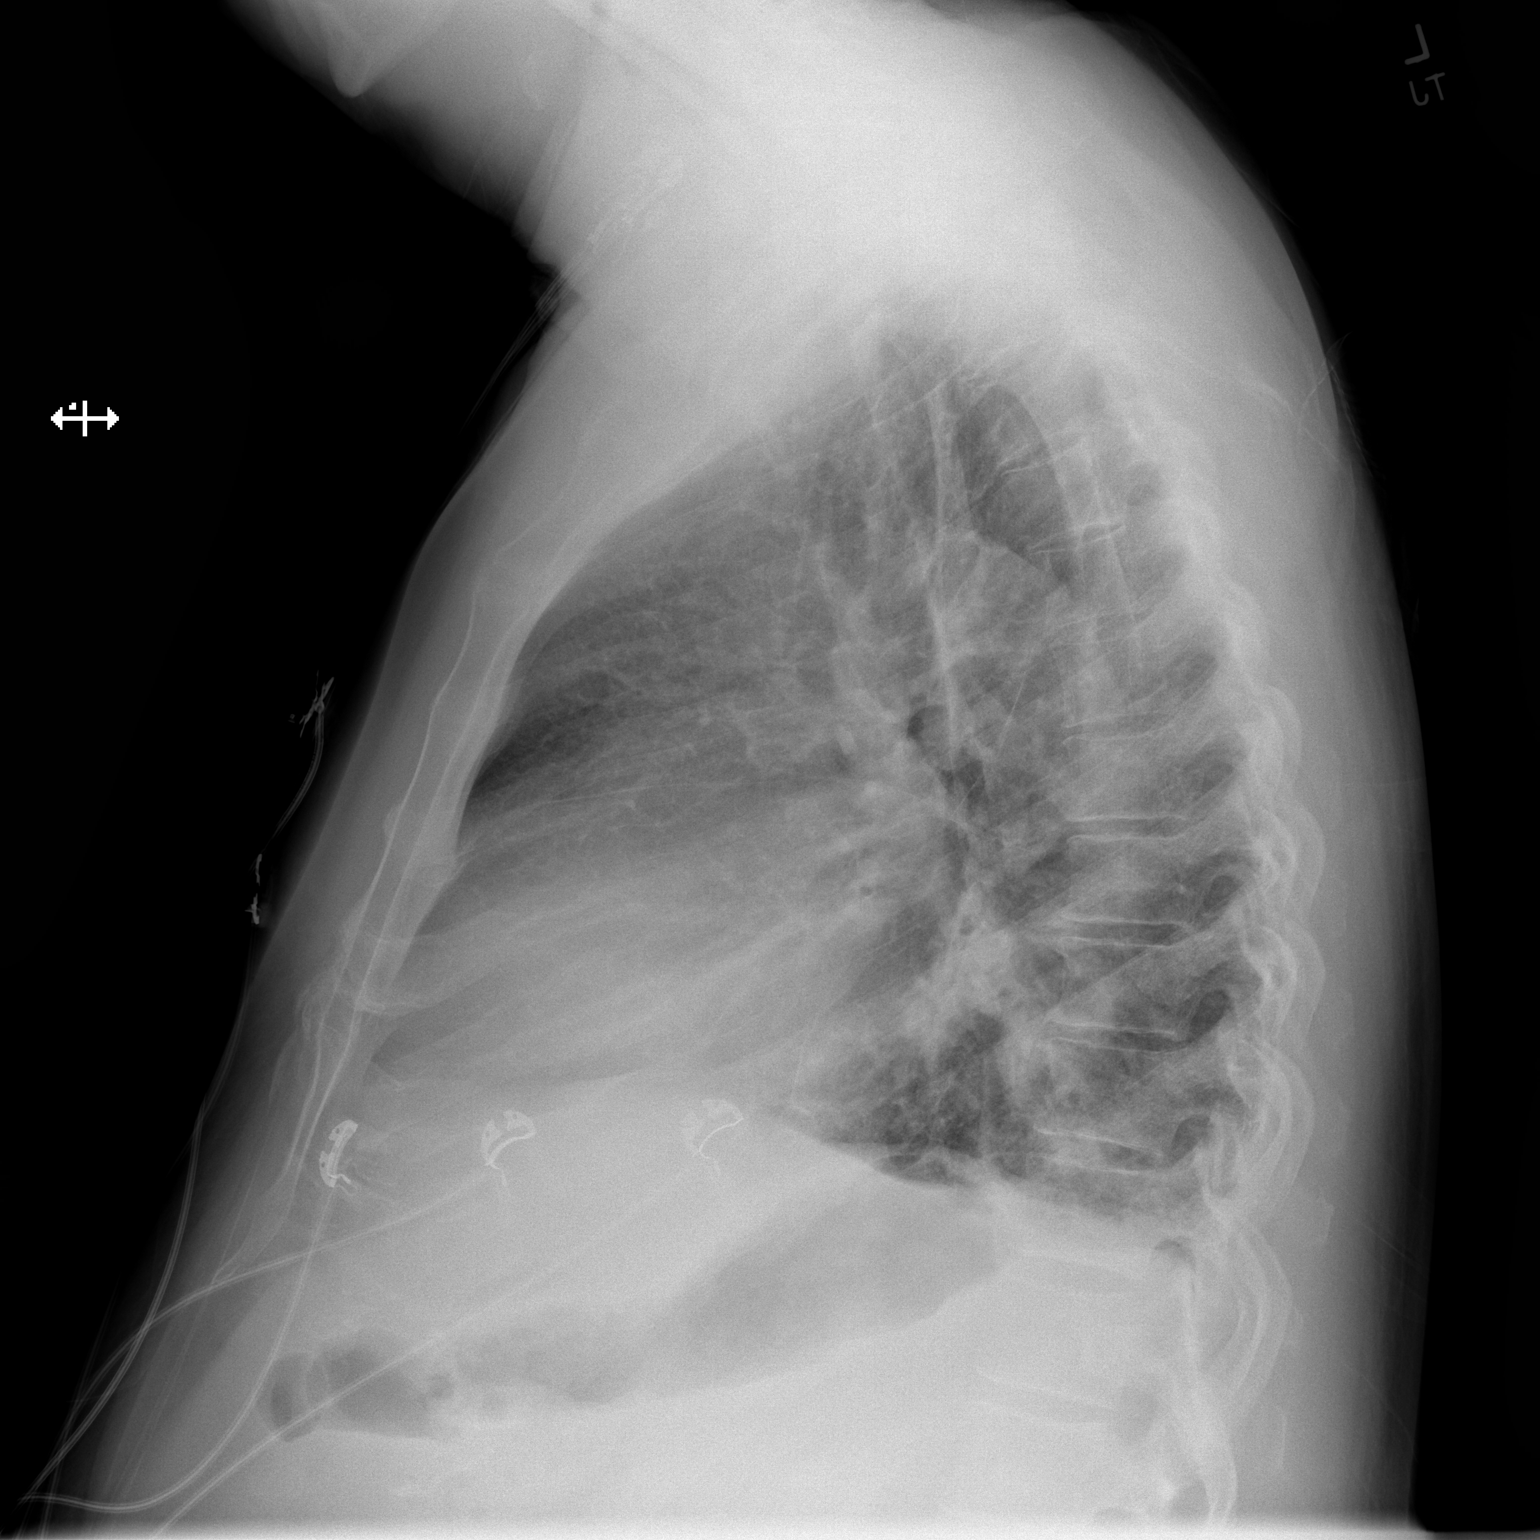

[2 of 2 positions shown; findings below may reference images not displayed]

FINDINGS: Similar cardiomegaly and vascular congestion, with mid and lower
lung interstitial opacities, possibly mild pulmonary edema.
Bibasilar atelectasis. No significant pleural effusion or
pneumothorax.
IMPRESSION: Cardiomegaly with likely mild pulmonary edema.

## 2023-11-20 IMAGING — DX DG CHEST 1V PORT
1 series · 1 of 1 positions shown · non-contrast
Comparison: 01/07/2022

CLINICAL DATA: Shortness of breath

EXAM:
PORTABLE CHEST 1 VIEW

[chest ap]
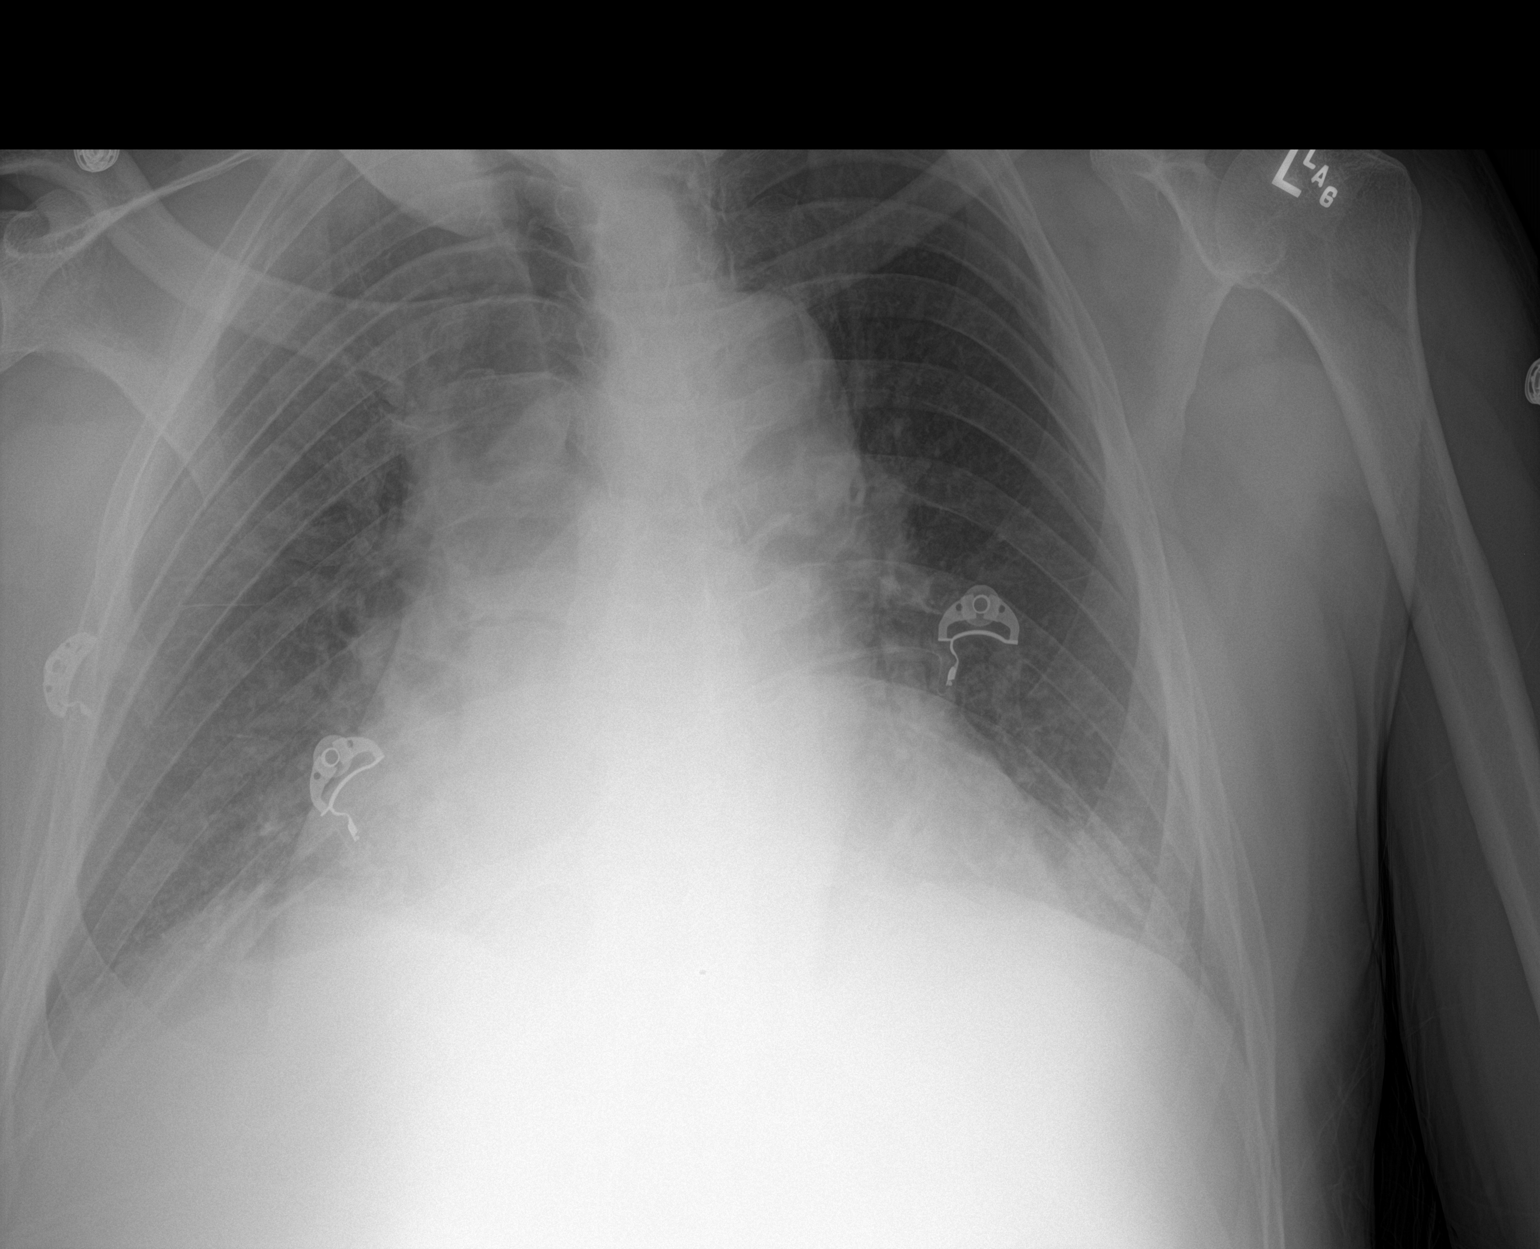

[1 of 1 positions shown; findings below may reference images not displayed]

FINDINGS: Rotated AP portable examination. Cardiomegaly. Both lungs are clear.
The visualized skeletal structures are unremarkable.
IMPRESSION: Cardiomegaly without acute abnormality of the lungs in rotated AP
portable projection.

## 2023-12-23 ENCOUNTER — Ambulatory Visit (HOSPITAL_COMMUNITY)
Admission: RE | Admit: 2023-12-23 | Discharge: 2023-12-23 | Disposition: A | Discharge status: Home / Self Care | Source: Ambulatory Visit | Attending: Cardiology | Admitting: Cardiology

## 2023-12-23 ENCOUNTER — Other Ambulatory Visit (HOSPITAL_COMMUNITY): Payer: Self-pay

## 2023-12-23 ENCOUNTER — Encounter (HOSPITAL_COMMUNITY): Payer: Self-pay | Admitting: Cardiology

## 2023-12-23 VITALS — BP 130/78 | HR 57 | Wt 205.4 lb

## 2023-12-23 DIAGNOSIS — I130 Hypertensive heart and chronic kidney disease with heart failure and stage 1 through stage 4 chronic kidney disease, or unspecified chronic kidney disease: Secondary | ICD-10-CM | POA: Insufficient documentation

## 2023-12-23 DIAGNOSIS — E11319 Type 2 diabetes mellitus with unspecified diabetic retinopathy without macular edema: Secondary | ICD-10-CM | POA: Diagnosis not present

## 2023-12-23 DIAGNOSIS — H5462 Unqualified visual loss, left eye, normal vision right eye: Secondary | ICD-10-CM | POA: Diagnosis not present

## 2023-12-23 DIAGNOSIS — N183 Chronic kidney disease, stage 3 unspecified: Secondary | ICD-10-CM | POA: Insufficient documentation

## 2023-12-23 DIAGNOSIS — I493 Ventricular premature depolarization: Secondary | ICD-10-CM | POA: Diagnosis not present

## 2023-12-23 DIAGNOSIS — I340 Nonrheumatic mitral (valve) insufficiency: Secondary | ICD-10-CM | POA: Diagnosis not present

## 2023-12-23 DIAGNOSIS — E1122 Type 2 diabetes mellitus with diabetic chronic kidney disease: Secondary | ICD-10-CM | POA: Insufficient documentation

## 2023-12-23 DIAGNOSIS — Z7984 Long term (current) use of oral hypoglycemic drugs: Secondary | ICD-10-CM | POA: Insufficient documentation

## 2023-12-23 DIAGNOSIS — H35042 Retinal micro-aneurysms, unspecified, left eye: Secondary | ICD-10-CM | POA: Diagnosis not present

## 2023-12-23 DIAGNOSIS — I5022 Chronic systolic (congestive) heart failure: Secondary | ICD-10-CM | POA: Diagnosis not present

## 2023-12-23 DIAGNOSIS — I110 Hypertensive heart disease with heart failure: Secondary | ICD-10-CM | POA: Diagnosis present

## 2023-12-23 DIAGNOSIS — M109 Gout, unspecified: Secondary | ICD-10-CM | POA: Diagnosis not present

## 2023-12-23 DIAGNOSIS — R9431 Abnormal electrocardiogram [ECG] [EKG]: Secondary | ICD-10-CM | POA: Insufficient documentation

## 2023-12-23 LAB — LIPID PANEL
Cholesterol: 183 mg/dL (ref 0–200)
HDL: 35 mg/dL — ABNORMAL LOW (ref >40)
LDL Cholesterol: 104 mg/dL — ABNORMAL HIGH (ref 0–99)
Total CHOL/HDL Ratio: 5.2 ratio
Triglycerides: 220 mg/dL — ABNORMAL HIGH (ref <150)
VLDL: 44 mg/dL — ABNORMAL HIGH (ref 0–40)

## 2023-12-23 LAB — BASIC METABOLIC PANEL WITH GFR
Anion gap: 10 (ref 5–15)
BUN: 13 mg/dL (ref 8–23)
CO2: 25 mmol/L (ref 22–32)
Calcium: 9.6 mg/dL (ref 8.9–10.3)
Chloride: 105 mmol/L (ref 98–111)
Creatinine, Ser: 1.46 mg/dL — ABNORMAL HIGH (ref 0.61–1.24)
GFR, Estimated: 54 mL/min — ABNORMAL LOW (ref >60)
Glucose, Bld: 163 mg/dL — ABNORMAL HIGH (ref 70–99)
Potassium: 4.4 mmol/L (ref 3.5–5.1)
Sodium: 140 mmol/L (ref 135–145)

## 2023-12-23 LAB — BRAIN NATRIURETIC PEPTIDE: B Natriuretic Peptide: 145 pg/mL — ABNORMAL HIGH (ref 0.0–100.0)

## 2023-12-23 MED ORDER — HYDRALAZINE HCL 25 MG PO TABS
25.0000 mg | ORAL_TABLET | Freq: Three times a day (TID) | ORAL | 3 refills | Status: DC
  Filled 2023-12-23 – 2024-02-10 (×2): qty 180, 60d supply, fill #0

## 2023-12-23 NOTE — Patient Instructions (Addendum)
 Good to see you today!  DECREASE Hydralazine  to 25 mg Three times a day  TAKE All Medications daily  Labs done today, your results will be available in MyChart, we will contact you for abnormal readings.   Your physician has requested that you have an echocardiogram. Echocardiography is a painless test that uses sound waves to create images of your heart. It provides your doctor with information about the size and shape of your heart and how well your heart's chambers and valves are working. This procedure takes approximately one hour. There are no restrictions for this procedure. Please do NOT wear cologne, perfume, aftershave, or lotions (deodorant is allowed). Please arrive 15 minutes prior to your appointment time.  Please note: We ask at that you not bring children with you during ultrasound (echo/ vascular) testing. Due to room size and safety concerns, children are not allowed in the ultrasound rooms during exams. Our front office staff cannot provide observation of children in our lobby area while testing is being conducted. An adult accompanying a patient to their appointment will only be allowed in the ultrasound room at the discretion of the ultrasound technician under special circumstances. We apologize for any inconvenience.  Please follow up with our heart failure pharmacist in as scheduled  Your physician recommends that you schedule a follow-up appointment in: as scheduled  If you have any questions or concerns before your next appointment please send us  a message through [place] or call our office at [phone].    TO LEAVE A MESSAGE FOR THE NURSE SELECT OPTION 2, PLEASE LEAVE A MESSAGE INCLUDING: YOUR NAME DATE OF BIRTH CALL BACK NUMBER REASON FOR CALL**this is important as we prioritize the call backs  YOU WILL RECEIVE A CALL BACK THE SAME DAY AS LONG AS YOU CALL BEFORE 4:00 PM At the [facility], you and your health needs are our priority. As part of  our continuing mission to provide you with exceptional heart care, we have created designated Provider Care Teams. These Care Teams include your primary Cardiologist (physician) and Advanced Practice Providers (APPs- Physician Assistants and Nurse Practitioners) who all work together to provide you with the care you need, when you need it.   You may see any of the following providers on your designated Care Team at your next follow up: Dr Jules Oar Dr Peder Bourdon Dr. Alwin Baars Dr. Arta Lark Amy Marijane Shoulders, NP Ruddy Corral, [place] [facility] [place], [place] Dennise Fitz, NP [place] Lee, NP Shawnee Dellen, NP Luster Salters, PharmD Bevely Brush, PharmD   Please be sure to bring in all your medications bottles to every appointment.    Thank you for choosing [facility]

## 2023-12-23 NOTE — Progress Notes (Signed)
 ADVANCED HF CLINIC NOTE  Primary Care: [name], NP HF Cardiologist: Dr. 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  Admitted 5/23 with new acute heart failure. Given IV lasix . Echo showed EF 15-20% range with mildly dilated LV, moderately dilated RV with severely decreased systolic function, moderate-severe functional mitral regurgitation, dilated IVC. Underwent R/LHC showing mildly elevated filling pressures, low CO and 70% proximal LAD stenosis, 90% distal LAD stenosis,  80% stenosis proximally in a moderate D1. Films reviewed with Dr. [place] and decided not to intervene in the absence of chest pain and presence of diffuse cardiomyopathy, out of proportion to known disease. Started on milrinone  and continued with diuresis. Drips weaned off and GDMT titrated. Started on mexiletine with frequent PVCs. Discharged home, weight 201 lbs.  Echo 8/23 showed EF 30-35%, mild LV dilation, moderate RV dysfunction with moderate MR, IVC normal.   Cardiac MRI in 11/23 showed LV EF 49% with moderate LVH, RV EF 47%, severe MR by regurgitant fraction (41%) but not visually, noncoronary LGE noted in the mid-wall basal septum, mid-wall basal-mid inferior wall, mid-wall basal anterolateral wall.   Zio monitor (11/23) showed 7.7% PVCs, Coreg  was increased.   Follow up 12/23, stable NYHA II symptoms. Arranged for TEE to evaluate etiology and severity of MR.  TEE in 4/24 showed EF 45-50%, mild LVH, mild MR, normal RV size/systolic function.   Cardiac PET in 2/25 showed EF 40%, no active inflammation to suggest active cardiac sarcoidosis.     Today he returns for HF follow up.  Doing well symptomatically.  No significant exertional dyspnea or chest pain.  No exercise limitations.  No orthopnea/PND.  Main complaint is lightheadedness at times after taking his medications.   BP 130/78 today but he has not taken his meds yet.  He says that he skips all his medications about twice a week. Weight up 2 lbs.   ECG (personally reviewed): NSR, PVCs, TWIs  Labs (5/23): K 4.4, creatinine 1.82, hgb 12.5 Labs (7/23): K 4.1, creatinine 1.56 Labs (8/23): K 4.1, creatinine 1.28 Labs (10/23): K 4.2, creatinine 1.59 Labs (11/23): NT-pro-BNP 319, K 3.6, creatinine 1.28 Labs (12/23): K 4.0, creatinine 1.40 Labs (3/24): K 4.7, creatinine 1.53 => 1.36, LDL 67 Labs (5/24): K 3.9, creatinine 1.39, BNP 184 Labs (9/24): K 3.9, creatinine 1.45, BNP 128, LDL 67  PMH: 1. 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   - Echo (5/23): EF 15-20%, moderately dilated RV with severely decreased systolic function, moderate-severe functional mitral regurgitation.  - RHC/LHC (5/23): Mean RA 11, PA 40/21, mean PCWP 23, CI 1.89; 70% proximal LAD stenosis, 90% distal LAD stenosis,  80% stenosis proximally in a moderate D1.  - Echo (8/23): EF 30-35%, mild LV dilation, moderate RV dysfunction with moderate MR, IVC normal.  - Cardiac MRI in 11/23 showed LV EF 49% with moderate LVH, RV EF 47%, severe MR by regurgitant fraction (41%) but not visually, noncoronary LGE noted in the mid-wall basal septum, mid-wall basal-mid inferior wall, mid-wall basal anterolateral wall.  - TEE (4/24): EF 45-50%, mild LVH, mild MR, normal RV size/systolic function.  - Cardiac PET (2/25): EF 40%, no active inflammation to suggest active cardiac sarcoidosis.  2. CAD: LHC (5/23) with 70% proximal LAD stenosis, 90% distal LAD stenosis,  80% stenosis proximally in a moderate D1. Managed medically.  3. HTN 4. DM2 5.  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 Gout 7. Hypothyroidism 8. PVCs: Zio monitor 11/23 with 7.7% PVCs.   Review of Systems: All systems reviewed and negative except as per HPI.   Current Outpatient Medications  Medication Sig Dispense Refill   allopurinol  (ZYLOPRIM ) 300 MG tablet  Take 1 tablet (300 mg total) by mouth daily. 100 tablet 0   aspirin  EC 81 MG tablet Take 1 tablet (81 mg total) by mouth daily. Swallow whole. 30 tablet    Blood Glucose Monitoring Suppl (TRUE METRIX METER) W/DEVICE KIT 1 each by Does not apply route 3 (three) times daily. 1 kit 0   carvedilol  (COREG ) 25 MG tablet Take 1 tablet (25 mg total) by mouth 2 (two) times daily. 60 tablet 6   colchicine  0.6 MG tablet Take 1 tablet (0.6 mg total) by mouth every 8 (eight) hours on day 1 of attack, THEN 1 tablet (0.6 mg total) 2 (two) times daily until pain free. 15 tablet 1   dapagliflozin  propanediol (FARXIGA ) 10 MG TABS tablet Take 1 tablet (10 mg total) by mouth daily. 90 tablet 3   glucose blood test strip Use as instructed 100 each 12   isosorbide  mononitrate (IMDUR ) 30 MG 24 hr tablet Take 1 tablet (30 mg total) by mouth daily. 90 tablet 3   levothyroxine  (SYNTHROID ) 75 MCG tablet Take 1 tablet (75 mcg total) by mouth in the morning on an empty stomach 100 tablet 0   metFORMIN  (GLUCOPHAGE ) 1000 MG tablet Take 1 tablet (1,000 mg total) by mouth 2 (two) times daily with a meal. 180 tablet 0   mexiletine (MEXITIL ) 150 MG capsule Take 1 capsule (150 mg total) by mouth every 12 (twelve) hours. Needs office visit. 180 capsule 0   pantoprazole  (PROTONIX ) 40 MG tablet Take 1 tablet (40 mg total) by mouth daily. 100 tablet 0   rosuvastatin  (CRESTOR ) 20 MG tablet Take 1 tablet (20 mg total) by mouth daily. 90 tablet 3   sacubitril -valsartan  (ENTRESTO ) 97-103 MG Take 1 tablet by mouth 2 (two) times daily. 60 tablet 11   spironolactone  (ALDACTONE ) 25 MG tablet Take 1 tablet (25 mg total) by mouth daily. 90 tablet 3   hydrALAZINE  (APRESOLINE ) 25 MG tablet Take 1 tablet (25 mg total) by mouth 3 (three) times daily. 180 tablet 3   No current facility-administered medications for this encounter.   No Known Allergies  Social History   Socioeconomic History   Marital status: Single    Spouse name: Not on file    Number of children: Not on file   Years of education: Not on file   Highest education level: Not on file  Occupational History   Not on file  Tobacco Use   Smoking status: Never   Smokeless tobacco: Never  Substance and Sexual Activity   Alcohol use: No   Drug use: No   Sexual activity: Yes  Other Topics Concern   Not on file  Social History Narrative   Not on file   Social Drivers of Health   Financial Resource Strain: Not on file  Food Insecurity: Not on file  Transportation Needs: Not on file  Physical Activity: Not on file  Stress: Not on file  Social Connections: Not on file  Intimate Partner Violence: Not on file   Family History  Problem Relation Age of Onset   Heart attack Father    Diabetes Mellitus II Neg Hx    CAD Neg Hx    BP 130/78  Pulse (!) 57   Wt 93.2 kg (205 lb 6.4 oz)   SpO2 98%   BMI 27.86 kg/m   Wt Readings from Last 3 Encounters:  12/23/23 93.2 kg (205 lb 6.4 oz)  06/04/23 93 kg (205 lb)  05/07/23 92.1 kg (203 lb)   PHYSICAL EXAM: General: NAD Neck: No JVD, no thyromegaly or thyroid  nodule.  Lungs: Clear to auscultation bilaterally with normal respiratory effort. CV: Nondisplaced PMI.  Heart regular S1/S2, no S3/S4, no murmur.  No peripheral edema.  No carotid bruit.  Normal pedal pulses.  Abdomen: Soft, nontender, no hepatosplenomegaly, no distention.  Skin: Intact without lesions or rashes.  Neurologic: Alert and oriented x 3.  Psych: Normal affect. Extremities: No clubbing or cyanosis.  HEENT: Normal.   ASSESSMENT & PLAN: 1. Chronic systolic CHF: Echo in 5/23 with EF 15-20% range with mildly dilated LV, moderately dilated RV with severely decreased systolic function, moderate-severe functional mitral regurgitation, dilated IVC.  Does not have history of ETOH or drugs.  No strong FH of cardiomyopathy.  He was noted in 5/23 hospitalization to have frequent PVCs, may contribute to CMP.  RHC in 5/23 showed elevated filling pressures and  low cardiac output (CI 1.89). LHC showed moderate LAD disease, however cardiomyopathy was out of proportion to degree of CAD. 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 Echo 8/23 showed some improvement, EF up to 30-35%, despite being out of his meds x 2 weeks prior.  Cardiac MRI in 11/23 showed further improvement in LV EF to 49% with moderate LVH, RV EF 47%, severe MR by regurgitant fraction (41%) but not visually, noncoronary LGE noted in the mid-wall basal septum, mid-wall basal-mid inferior wall, mid-wall basal anterolateral wall.  MRI concerning for possible prior myocarditis or cardiac sarcoidosis, but pattern could also be seen with a variant of hypertrophic cardiomyopathy given the moderate LVH. [employer] gene testing was negative for gene mutations associated with familial dilated cardiomyopathies and hypertrophic cardiomyopathy. TEE in 4/24 with EF 45-50% with mild MR.  Cardiac PET in 2/25 showed EF 40%, no active inflammation to suggest active cardiac sarcoidosis.  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  NYHA class I per his report, not volume overloaded on exam.   - He skips all his meds twice a week due to lightheadedness.  Rather than skipping meds twice a week, I will have him decrease hydralazine  to 25 mg tid and make sure to take all his meds daily.  If he has orthostatic symptoms on this dose of hydralazine , can stop hydralazine /Imdur  in the future.    - Continue Entresto  97/103 mg bid. BMET/BNP today.  - He does not need Lasix .  - Continue Coreg  25 mg bid.   - Continue dapagliflozin  10 mg daily.   - Continue spironolactone  25 mg daily.  - Narrow QRS, would not be CRT candidate. EF was out of ICD range on last echo.    - I will arrange for repeat echo.  2. CAD: Mod LAD and Diag disease on 5/23 cath. The proximal LAD lesion is not critical but looks to be around 70% (moderate stenosis).  This could potentially be intervened upon, as could the 1st diagonal (would not  intervene on the severe far distal LAD stenosis).  However, patient reports no chest pain and his cardiomyopathy is diffuse and not explained by the moderate LAD disease. Would manage medically for now unless he develops chest pain.  - Continue ASA + statin, check lipids.   3. CKD stage 3: suspect  baseline diabetic nephropathy.  - Continue Farxiga .  4. Diabetes: Type 2. Per PCP.  5. HTN: Has not taken any meds today. Borderline elevated BP.  6. Mitral regurgitation: Mod-severe functional MR on 5/23 echo, echo 8/23 showed moderate MR.  Cardiac MRI in 11/23 did not appear to show significant MR visually but calculated regurgitant fraction was 41%. He does not have a loud murmur.  TEE in 4/24 showed only mild MR.  7. PVCs: Frequent during 5/23 admission. Zio monitor in 11/23 (on mexiletine) showed 7.7% PVCs.  PVCs noted on ECG today but has not had mexiletine or Coreg  today.  - Continue mexiletine 150 mg bid.  - Continue Coreg .    Followup in 3 months with APP.   I spent 31 minutes reviewing records, interviewing/examining patient, and managing orders.   Peder Bourdon  12/23/2023

## 2023-12-24 ENCOUNTER — Other Ambulatory Visit (HOSPITAL_COMMUNITY): Payer: Self-pay

## 2023-12-24 ENCOUNTER — Telehealth (HOSPITAL_COMMUNITY): Payer: Self-pay | Admitting: *Deleted

## 2023-12-24 MED ORDER — ROSUVASTATIN CALCIUM 40 MG PO TABS
40.0000 mg | ORAL_TABLET | Freq: Every day | ORAL | 6 refills | Status: AC
  Filled 2023-12-24 – 2024-02-10 (×2): qty 30, 30d supply, fill #0
  Filled 2024-03-23 (×2): qty 30, 30d supply, fill #1
  Filled 2024-04-12 – 2024-07-01 (×2): qty 30, 30d supply, fill #2
  Filled 2024-08-16: qty 30, 30d supply, fill #3

## 2023-12-24 NOTE — Telephone Encounter (Signed)
 Pt aware, agreeable, and verbalized understanding, new rx sent, labs sch w/f/u

## 2023-12-24 NOTE — Telephone Encounter (Signed)
-----   Message from Peder Bourdon sent at 12/23/2023  3:14 PM EDT ----- Would like to see LDL lower.  Increase Crestor  to 40 mg daily with lipids/LFTs in 2 months.

## 2024-01-02 ENCOUNTER — Other Ambulatory Visit (HOSPITAL_COMMUNITY): Payer: Self-pay

## 2024-01-06 ENCOUNTER — Other Ambulatory Visit (HOSPITAL_COMMUNITY): Payer: Self-pay

## 2024-01-07 ENCOUNTER — Other Ambulatory Visit (HOSPITAL_COMMUNITY): Payer: Self-pay

## 2024-01-10 NOTE — Progress Notes (Incomplete)
 ***In Progress***    Advanced Heart Failure Clinic Note   Primary Care: [name], NP HF Cardiologist: Dr. 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   Admitted 12/2021 with new acute heart failure. Given IV diuretics. Echo showed EF 15-20% range with mildly dilated LV, moderately dilated RV with severely decreased systolic function, moderate-severe functional mitral regurgitation, dilated IVC. Underwent R/LHC showing mildly elevated filling pressures, low CO and 70% proximal LAD stenosis, 90% distal LAD stenosis,  80% stenosis proximally in a moderate D1. Films reviewed with Dr. [place] and decided not to intervene in the absence of chest pain and presence of diffuse cardiomyopathy, out of proportion to known disease. Started on milrinone  and continued with diuresis. Drips weaned off and GDMT titrated. Started on mexiletine with frequent PVCs. Discharged home, weight 201 lbs.   Echo 04/2022 showed EF 30-35%, mild LV dilation, moderate RV dysfunction with moderate MR, IVC normal.    Cardiac MRI 07/2022 showed LV EF 49% with moderate LVH, RV EF 47%, severe MR by regurgitant fraction (41%) but not visually, noncoronary LGE noted in the mid-wall basal septum, mid-wall basal-mid inferior wall, mid-wall basal anterolateral wall.    Zio monitor (07/2022) showed 7.7% PVCs, carvedilol  was increased.    Follow up 08/2022, stable NYHA II symptoms. Arranged for TEE to evaluate etiology and severity of MR.  TEE in 12/2022 showed EF 45-50%, mild LVH, mild MR, normal RV size/systolic function.    Cardiac PET in 10/2023 showed EF 40%, no active inflammation to suggest active cardiac sarcoidosis.   Follow up 12/2023, he was doing well symptomatically. Main complaint was lightheadedness at times after taking his medications. BP 130/78 mmHg but he had not taken his meds that morning. He reported  skipping all his medications about twice a week. Weight was up 2 lbs.   Today he returns to HF clinic for pharmacist medication titration. At last visit with MD hydralazine  was decreased to 25 mg TID. ***  Shortness of breath/dyspnea on exertion? {YES NO:22349}  Orthopnea/PND? {YES NO:22349} Edema? {YES NO:22349} Lightheadedness/dizziness? {YES NO:22349} Daily weights at home? {YES NO:22349} Blood pressure/heart rate monitoring at home? {YES [id number] Following low-sodium/fluid-restricted diet? {YES NO:22349}  HF Medications: Carvedilol  25 mg BID Entresto  97/103 mg BID Spironolactone  25 mg daily Farxiga  10 mg daily Hydralazine  25 mg TID Isosorbide  mononitrate 30 mg daily  Has the patient been experiencing any side effects to the medications prescribed?  {YES NO:22349}  Does the patient have any problems obtaining medications due to transportation or finances?  Insurance: Medicaid ***  Understanding of regimen: {excellent/good/fair/poor:19665} Understanding of indications: {excellent/good/fair/poor:19665} Potential of compliance: {excellent/good/fair/poor:19665} Patient understands to avoid NSAIDs. Patient understands to avoid decongestants.    Pertinent Lab Values: 12/23/23: Serum creatinine 1.46 mg/dL, BUN 13 mg/dL, Potassium 4.4 mmol/L, Sodium 140 mmol/L, BNP 145 pg/mL   Vital Signs: Weight: *** (last clinic weight: 205 lbs) Blood pressure: *** (last BP: 130/78 mmHg) Heart rate: *** (last HR: 57 bpm)  Brief a/p: -no labs today -BP in clinic; checking at home? Take meds this morning? -skipping doses still? -need 90-day supply orders (coreg , Entresto ) -taking mexitil ? May need EKG if pulse-ox is low -rosuvastatin ? Not filled If BP stable + no missed doses >no changes >f/u ECHO in 2 weeks, APP in 2 months If BP low (bc taking meds), dc hydral + imdur  >f/u ECHO in 2 weeks, APP in 2  months If BP up >ensure no missed doses + consider inc hydral to 50 TID or mixed depending  on when most sympotmoatic  ASSESSMENT & PLAN: 1. Chronic systolic CHF: Echo in 12/2021 with EF 15-20% range with mildly dilated LV, moderately dilated RV with severely decreased systolic function, moderate-severe functional mitral regurgitation, dilated IVC.  Does not have history of ETOH or drugs.  No strong FH of cardiomyopathy.  He was noted in 12/2021 hospitalization to have frequent PVCs, may contribute to CMP.  RHC in 12/2021 showed elevated filling pressures and low cardiac output (CI 1.89). LHC showed moderate LAD disease, however cardiomyopathy was out of proportion to degree of CAD. 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 Echo 04/2022 showed some improvement, EF up to 30-35%, despite being out of his meds x 2 weeks prior.  Cardiac MRI in 07/2022 showed further improvement in LV EF to 49% with moderate LVH, RV EF 47%, severe MR by regurgitant fraction (41%) but not visually, noncoronary LGE noted in the mid-wall basal septum, mid-wall basal-mid inferior wall, mid-wall basal anterolateral wall. MRI concerning for possible prior myocarditis or cardiac sarcoidosis, but pattern could also be seen with a variant of hypertrophic cardiomyopathy given the moderate LVH. [employer] gene testing was negative for gene mutations associated with familial dilated cardiomyopathies and hypertrophic cardiomyopathy. TEE in 12/2022 with EF 45-50% with mild MR. Cardiac PET in 10/2023 showed EF 40%, no active inflammation to suggest active cardiac sarcoidosis. 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 NYHA class I per his report, not volume overloaded on exam.   - ***He skips all his meds twice a week due to lightheadedness.  Rather than skipping meds twice a week, I will have him decrease hydralazine  to 25 mg TID and make sure to take all his meds daily.  If he has orthostatic symptoms on this dose of hydralazine , can stop hydralazine /Imdur  in the future.*** - Continue carvedilol  25 mg BID  - Continue Entresto   97/103 mg BID - Continue spironolactone  25 mg daily.  - Continue Farxiga  10 mg daily   - ***Continue hydralazine  25 mg TID - ***Continue isosorbide  mononitrate 30 mg daily - Narrow QRS, would not be CRT candidate. EF was out of ICD range on last echo. 2. CAD: Mod LAD and Diag disease on 5/23 cath. The proximal LAD lesion is not critical but looks to be around 70% (moderate stenosis).  This could potentially be intervened upon, as could the 1st diagonal (would not intervene on the severe far distal LAD stenosis).  However, patient reports no chest pain and his cardiomyopathy is diffuse and not explained by the moderate LAD disease. Would manage medically for now unless he develops chest pain.  - Continue ASA + statin 3. CKD stage 3: suspect baseline diabetic nephropathy.  - Continue Farxiga .  4. Diabetes: Type 2. Per PCP.  - Continue Farxiga  5. HTN: ***Borderline elevated BP ***took meds? 6. Mitral regurgitation: Mod-severe functional MR on 12/2021 echo, echo 04/2022 showed moderate MR.  Cardiac MRI in 07/2022 did not appear to show significant MR visually but calculated regurgitant fraction was 41%. He does not have a loud murmur.  TEE in 12/2022 showed only mild MR.  7. PVCs: Frequent during 12/2021 admission. Zio monitor 07/2022 (on mexiletine) showed 7.7% PVCs.   - Continue mexiletine 150 mg BID - Continue carvedilol     Followup in ***2 months with *** APP.   Albino Alu, PharmD PGY2 Cardiology Pharmacy Resident   Luster Salters, PharmD, BCPS, BCCP, CPP Heart Failure Clinic  Pharmacist [phone]

## 2024-01-13 ENCOUNTER — Other Ambulatory Visit (HOSPITAL_COMMUNITY): Payer: Self-pay

## 2024-01-13 ENCOUNTER — Inpatient Hospital Stay (HOSPITAL_COMMUNITY): Admission: RE | Admit: 2024-01-13 | Source: Ambulatory Visit

## 2024-01-28 ENCOUNTER — Ambulatory Visit (HOSPITAL_COMMUNITY)
Admission: RE | Admit: 2024-01-28 | Discharge: 2024-01-28 | Disposition: A | Discharge status: Home / Self Care | Source: Ambulatory Visit | Attending: Cardiology | Admitting: Cardiology

## 2024-01-28 ENCOUNTER — Ambulatory Visit (HOSPITAL_COMMUNITY): Payer: Self-pay | Admitting: Cardiology

## 2024-01-28 DIAGNOSIS — Z006 Encounter for examination for normal comparison and control in clinical research program: Secondary | ICD-10-CM

## 2024-01-28 DIAGNOSIS — E119 Type 2 diabetes mellitus without complications: Secondary | ICD-10-CM | POA: Insufficient documentation

## 2024-01-28 DIAGNOSIS — I5022 Chronic systolic (congestive) heart failure: Secondary | ICD-10-CM | POA: Insufficient documentation

## 2024-01-28 DIAGNOSIS — I340 Nonrheumatic mitral (valve) insufficiency: Secondary | ICD-10-CM | POA: Diagnosis not present

## 2024-01-28 DIAGNOSIS — I110 Hypertensive heart disease with heart failure: Secondary | ICD-10-CM | POA: Insufficient documentation

## 2024-01-28 LAB — ECHOCARDIOGRAM COMPLETE
AR max vel: 2.3 cm2
AV Peak grad: 7.7 mmHg
Ao pk vel: 1.39 m/s
Area-P 1/2: 2.08 cm2
Calc EF: 39.5 %
MV M vel: 5.76 m/s
MV Peak grad: 132.7 mmHg
MV VTI: 2.43 cm2
S' Lateral: 4.5 cm
Single Plane A2C EF: 38.3 %
Single Plane A4C EF: 40.5 %

## 2024-01-28 NOTE — Research (Signed)
 SITE: [id number]     Subject # [id number]   Subprotocol: A  Inclusion Criteria  Patients who meet all of the following criteria are eligible for enrollment as study participants:  Yes No  Age > 64 years old X   Eligible to wear Holter Study X    Exclusion Criteria  Patients who meet any of these criteria are not eligible for enrollment as study participants: Yes No  1. Receiving any mechanical (respiratory or circulatory) or renal support therapy at Screening or during Visit #1.  X  2.  Any other conditions that in the opinion of the investigators are likely to prevent compliance with the study protocol or pose a safety concern if the subject participates in the study.  X  3. Poor tolerance, namely susceptible to severe skin allergies from ECG adhesive patch application.  X   Protocol: REV H    60 minute start window         Cor device must be applied, and the study initiated, no later than 60 minutes of completing the Echocardiogram                             HH:MM  Echo completion time  08:33  2.   Cor Study start time  08:52   30-Minute execution window  Once Cor Monitoring begins, 3 QT Med ECGs and the 15-minute rest period must be completed within a 30 minute window     HH:MM  3. QT Med ECG Completion time  08:55  4. Start of 15-Min sitting rest period  08:56  5. End of 15-Min rest period  09:11  6. Time of device removal  09:24   *Continue to use the Mobile App Event feature to log the Rest period windows and follow instructions on the EF-ACT Clinical Trial  Patient Instruction Card.  Describe any anomalies in Protocol execution in the Protocol Deviation Log    Residential Zip code [address] (First 3 digits ONLY)                                           [facility] Informed Consent   Subject Name: Eric Larson  Subject met inclusion and exclusion criteria.  The informed consent form, study requirements and expectations were reviewed with the subject. Subject had opportunity to  read consent and questions and concerns were addressed prior to the signing of the consent form.  The subject verbalized understanding of the trial requirements.  The subject agreed to participate in the [facility] EF ACT trial and signed the informed consent at 08:47 on 28-Jan-2024.  The informed consent was obtained prior to performance of any protocol-specific procedures for the subject.  A copy of the signed informed consent was given to the subject and a copy was placed in the subject's medical record.   Eric Larson          Current Outpatient Medications:    allopurinol  (ZYLOPRIM ) 300 MG tablet, Take 1 tablet (300 mg total) by mouth daily., Disp: 100 tablet, Rfl: 0   aspirin  EC 81 MG tablet, Take 1 tablet (81 mg total) by mouth daily. Swallow whole., Disp: 30 tablet, Rfl:    Blood Glucose Monitoring Suppl (TRUE METRIX METER) W/DEVICE KIT, 1 each by Does not apply route 3 (three) times daily., Disp: 1 kit, Rfl: 0  carvedilol  (COREG ) 25 MG tablet, Take 1 tablet (25 mg total) by mouth 2 (two) times daily., Disp: 60 tablet, Rfl: 6   colchicine  0.6 MG tablet, Take 1 tablet (0.6 mg total) by mouth every 8 (eight) hours on day 1 of attack, THEN 1 tablet (0.6 mg total) 2 (two) times daily until pain free., Disp: 15 tablet, Rfl: 1   dapagliflozin  propanediol (FARXIGA ) 10 MG TABS tablet, Take 1 tablet (10 mg total) by mouth daily., Disp: 90 tablet, Rfl: 3   glucose blood test strip, Use as instructed, Disp: 100 each, Rfl: 12   hydrALAZINE  (APRESOLINE ) 25 MG tablet, Take 1 tablet (25 mg total) by mouth 3 (three) times daily., Disp: 180 tablet, Rfl: 3   isosorbide  mononitrate (IMDUR ) 30 MG 24 hr tablet, Take 1 tablet (30 mg total) by mouth daily., Disp: 90 tablet, Rfl: 3   levothyroxine  (SYNTHROID ) 75 MCG tablet, Take 1 tablet (75 mcg total) by mouth in the morning on an empty stomach, Disp: 100 tablet, Rfl: 0   metFORMIN  (GLUCOPHAGE ) 1000 MG tablet, Take 1 tablet (1,000 mg total) by mouth 2 (two)  times daily with a meal., Disp: 180 tablet, Rfl: 0   mexiletine (MEXITIL ) 150 MG capsule, Take 1 capsule (150 mg total) by mouth every 12 (twelve) hours. Needs office visit., Disp: 180 capsule, Rfl: 0   pantoprazole  (PROTONIX ) 40 MG tablet, Take 1 tablet (40 mg total) by mouth daily., Disp: 100 tablet, Rfl: 0   rosuvastatin  (CRESTOR ) 40 MG tablet, Take 1 tablet (40 mg total) by mouth daily., Disp: 30 tablet, Rfl: 6   sacubitril -valsartan  (ENTRESTO ) 97-103 MG, Take 1 tablet by mouth 2 (two) times daily., Disp: 60 tablet, Rfl: 11   spironolactone  (ALDACTONE ) 25 MG tablet, Take 1 tablet (25 mg total) by mouth daily., Disp: 90 tablet, Rfl: 3

## 2024-02-10 ENCOUNTER — Other Ambulatory Visit (HOSPITAL_COMMUNITY): Payer: Self-pay

## 2024-02-10 ENCOUNTER — Other Ambulatory Visit (HOSPITAL_COMMUNITY): Payer: Self-pay | Admitting: Cardiology

## 2024-02-10 ENCOUNTER — Encounter (HOSPITAL_COMMUNITY): Payer: Self-pay

## 2024-02-13 ENCOUNTER — Other Ambulatory Visit (HOSPITAL_COMMUNITY): Payer: Self-pay

## 2024-02-13 ENCOUNTER — Other Ambulatory Visit: Payer: Self-pay

## 2024-02-13 MED ORDER — MEXILETINE HCL 150 MG PO CAPS
150.0000 mg | ORAL_CAPSULE | Freq: Two times a day (BID) | ORAL | 0 refills | Status: DC
  Filled 2024-02-13: qty 180, 90d supply, fill #0

## 2024-02-13 MED ORDER — ENTRESTO 97-103 MG PO TABS
1.0000 | ORAL_TABLET | Freq: Two times a day (BID) | ORAL | 11 refills | Status: AC
  Filled 2024-02-13: qty 60, 30d supply, fill #0
  Filled 2024-03-23 (×2): qty 60, 30d supply, fill #1
  Filled 2024-04-12: qty 60, 30d supply, fill #2

## 2024-03-09 DIAGNOSIS — Z006 Encounter for examination for normal comparison and control in clinical research program: Secondary | ICD-10-CM

## 2024-03-09 NOTE — Research (Unsigned)
 CORDIO DETECT END OF STUDY VISIT   VISIT TYPE  Date of Visit: [] [] -[] [] [] -[] [] [] []   (dd/mmm/yyyy)  Reason for Visit: ? Completed study  ? Study-wide endpoint achieved; subject completed study ? Subject Discontinued from Study (Dropout) - proceed to next question  Reason for Dropout: ? Adverse Event ? Subject decision/withdrew consent ? Lost to follow up  ? Discontinuation due to Investigator decision (describe):    ? Other (describe):      Did subject complete KCCQ questionnaire.   ? Completed ? Not Completed (explain):   Was subject alive at end of study? ? Yes ? No (specify date of death):  (dd-MMM-yyyy)  NYHA  NYHA Class: [] 1 [] 2 [] 3 [] 4    Adverse Events/Serious Adverse Events  AE/SAE identified: [] Yes [] No      CCQ  Visit Type: ? Screening    ? Unscheduled Visit       ? End of Study/Discontinuation Visit  Date Completed: [] [] -[] [] [] -[] [] [] []   (dd/mmm/yyyy)  Heart failure affects different people in different ways. Some feel shortness of breath while others feel fatigue. Please indicate how much you are limited by heart failure (shortness of breath or fatigue) in your ability to do the following activities over the past 2 weeks.   Extremely Limited Quite a bit  Limited Moderately Limited Slightly Limited Not at all Limited Limited for other reasons or did not do activity  Dressing yourself ? ? ? ? ? ?  Showering/Bathing ? ? ? ? ? ?  Walking 1 block on level ground ? ? ? ? ? ?  Doing yardwork, housework, or carrying groceries ? ? ? ? ? ?  Climbing a flight of stairs without stopping ? ? ? ? ? ?  Hurrying or Jogging (as if to catch a bus) ? ? ? ? ? ?  Compared with 2 weeks ago, have your symptoms of heart failure (shortness of breath, fatigue, or ankle swelling) changed? My symptoms of heart failure have become:  Much Worse Slightly Worse Not Changed Slightly Better Much Better I've had no symptoms over the last 2 weeks  ? ? ? ? ? ?  Over the past 2 weeks,  how many times did you have swelling in your feet, ankles, or legs when you woke up in the morning?  Every Morning 3 or more times a week, but not every day 1-2 times a week Less than once a week Never over the past 2 weeks  ? ? ? ? ?  Over the past 2 weeks, how much has swelling in your feet, ankles, or legs bothered you? It has been:  Extremely bothersome Quite a bit bothersome Moderately bothersome Slightly bothersome Not at all bothersome I've had no swelling  ? ? ? ? ? ?  Over the past 2 weeks, on average, how many times has fatigue limited your ability to do what you want?  All of the time Several times per day At least once a day 3 or more times a week, but not every day 1-2 times a week Less than once a week Never over the past 2 weeks  ? ? ? ? ? ? ?  Over the past 2 weeks, how much has your fatigue bothered you?  It has been:  Extremely bothersome Quite a bit bothersome Moderately bothersome Slightly bothersome Not at all bothersome I've had no fatigue  ? ? ? ? ? ?  Over the past 2 weeks, on average, how many times has shortness of breath limited your  ability to do what you wanted?  All of the time Several times per day At least once a day 3 or more times a week, but not every day 1-2 times a week Less than once a week Never over the past 2 weeks  ? ? ? ? ? ? ?  Over the past 2 weeks, how much has your shortness of breath bothered you?  It has been:  Extremely bothersome Quite a bit bothersome Moderately bothersome Slightly bothersome Not at all bothersome I've had no shortness of breath  ? ? ? ? ? ?  Over the past 2 weeks, on average, how many times have you been forced to sleep sitting up in a chair or with at least 3 pillows to prop you up because of shortness of breath?  Every Night 3 or more times a week, but not every day 1-2 times a week Less than once a week Never over the past 2 weeks  ? ? ? ? ?  Heart failure symptoms can worsen  for a number of reasons. How sure are you that you know what to do, or whom to call, if your heart failure gets worse?  Not at all sure Not very sure Somewhat sure Mostly sure Completely sure  ? ? ? ? ?  How well do you understand what things you are able to do to keep your heart failure symptoms from getting worse? (for example, weighing yourself, eating a low salt diet, etc.)  Do not understand at all Do not understand very well Somewhat understand Mostly understand Completely understand  ? ? ? ? ?  Over the past 2 weeks, how much has your heart failure limited your enjoyment of life?  It has extremely limited my enjoyment of life It has limited my enjoyment of life quite a bit It has moderately limited my enjoyment of life It has slightly limited my enjoyment of life It has not limited my enjoyment of life at all  ? ? ? ? ?  If you had to spend the rest of your life with your heart failure the way it is right now, how would you feel about this?  Not at all satisfied Mostly dissatisfied Somewhat dissatisfied Mostly satisfied Completely satisfied  ? ? ? ? ?  Over the past 2 weeks, how often have you felt discouraged or down in the dumps because of your heart failure?  I felt that way all of the time I felt that way most of the time I occasionally felt that way I rarely felt that way I never felt that way  ? ? ? ? ?  How much does your heart failure affect your lifestyle? Please indicate how your heart failure may have limited your participation in the following activities over the past 2 weeks.   Severely Limited Limited Quite a bit Moderately Limited Slightly Limited Did not Limit at all Does not apply or did not do for other reason  Hobbies, recreational activities ? ? ? ? ? ?  Working or doing household chores ? ? ? ? ? ?  Visiting family or friends out of your home ? ? ? ? ? ?  Intimate relationships with loved ones ? ? ? ? ? ?

## 2024-03-22 ENCOUNTER — Telehealth (HOSPITAL_COMMUNITY): Payer: Self-pay | Admitting: *Deleted

## 2024-03-22 NOTE — Telephone Encounter (Signed)
 Called to confirm/remind patient of their appointment at the [facility] on 03/23/24.       Appointment:              [x] Confirmed             [] Left mess              [] No answer/No voice mail             [] Phone not in service   Patient reminded to bring all medications and/or complete list.   Confirmed patient has transportation. Gave directions, instructed to utilize valet parking.

## 2024-03-22 NOTE — Progress Notes (Signed)
 [facility] CL[facility] NOTE  Primary Care: Zachary Lamar BRAVO, NP HF Cardiologist: Dr. Rolan  HPI: Eric Larson is a 63 y.o. 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  Admitted 5/23 with new acute heart failure. Given IV lasix . Echo showed EF 15-20% range with mildly dilated LV, moderately dilated RV with severely decreased systolic function, moderate-severe functional mitral regurgitation, dilated IVC. Underwent R/LHC showing mildly elevated filling pressures, low CO and 70% proximal LAD stenosis, 90% distal LAD stenosis,  80% stenosis proximally in a moderate D1. Films reviewed with Dr. [place] and decided not to intervene in the absence of chest pain and presence of diffuse cardiomyopathy, out of proportion to known disease. Started on milrinone  and continued with diuresis. Drips weaned off and GDMT titrated. Started on mexiletine with frequent PVCs. Discharged home, weight 201 lbs.  Echo 8/23 showed EF 30-35%, mild LV dilation, moderate RV dysfunction with moderate MR, IVC normal.   Cardiac MRI in 11/23 showed LV EF 49% with moderate LVH, RV EF 47%, severe MR by regurgitant fraction (41%) but not visually, noncoronary LGE noted in the mid-wall basal septum, mid-wall basal-mid inferior wall, mid-wall basal anterolateral wall.   Zio monitor (11/23) showed 7.7% PVCs, Coreg  was increased.   Follow up 12/23, stable NYHA II symptoms. Arranged for TEE to evaluate etiology and severity of MR.  TEE in 4/24 showed EF 45-50%, mild LVH, mild MR, normal RV size/systolic function.   Cardiac PET in 2/25 showed EF 40%, no active inflammation to suggest active cardiac sarcoidosis.   Echo 5/25 showed EF 35-40%, moderate MR.    Today he returns for HF follow up. Overall feeling fine. More tired lately, he attributes this to being outside in the heat. No SOB walking up steps. Denies palpitations, abnormal bleeding, CP, dizziness, edema,  or PND/Orthopnea. Appetite ok.  Weight at home 207 pounds. Taking all medications, no longer missing days.  ECG (personally reviewed): none ordered today.  Labs (3/24): K 4.7, creatinine 1.53 => 1.36, LDL 67 Labs (5/24): K 3.9, creatinine 1.39, BNP 184 Labs (9/24): K 3.9, creatinine 1.45, BNP 128, LDL 67 Labs (4/25): K 4.4, creatinine 1.46, LDL 104  PMH: 1. 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   - Echo (5/23): EF 15-20%, moderately dilated RV with severely decreased systolic function, moderate-severe functional mitral regurgitation.  - RHC/LHC (5/23): Mean RA 11, PA 40/21, mean PCWP 23, CI 1.89; 70% proximal LAD stenosis, 90% distal LAD stenosis,  80% stenosis proximally in a moderate D1.  - Echo (8/23): EF 30-35%, mild LV dilation, moderate RV dysfunction with moderate MR, IVC normal.  - Cardiac MRI in 11/23 showed LV EF 49% with moderate LVH, RV EF 47%, severe MR by regurgitant fraction (41%) but not visually, noncoronary LGE noted in the mid-wall basal septum, mid-wall basal-mid inferior wall, mid-wall basal anterolateral wall.  - TEE (4/24): EF 45-50%, mild LVH, mild MR, normal RV size/systolic function.  - Cardiac PET (2/25): EF 40%, no active inflammation to suggest active cardiac sarcoidosis.  - Echo 5/325: EF 35-40%, moderate MR 2. CAD: LHC (5/23) with 70% proximal LAD stenosis, 90% distal LAD stenosis,  80% stenosis proximally in a moderate D1. Managed medically.  3. HTN 4. DM2 5. 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 Gout 7. Hypothyroidism 8. PVCs: Zio monitor 11/23 with 7.7% PVCs.   Review of Systems: All systems reviewed and negative except as per HPI.   Current Outpatient  Medications  Medication Sig Dispense Refill   allopurinol  (ZYLOPRIM ) 300 MG tablet Take 1 tablet (300 mg total) by mouth daily. 100 tablet 0   aspirin  EC 81 MG tablet Take 1 tablet (81 mg total) by mouth daily. Swallow whole. 30 tablet    Blood Glucose Monitoring Suppl  (TRUE METRIX METER) W/DEVICE KIT 1 each by Does not apply route 3 (three) times daily. 1 kit 0   carvedilol  (COREG ) 25 MG tablet Take 1 tablet (25 mg total) by mouth 2 (two) times daily. 60 tablet 6   colchicine  0.6 MG tablet Take 1 tablet (0.6 mg total) by mouth every 8 (eight) hours on day 1 of attack, THEN 1 tablet (0.6 mg total) 2 (two) times daily until pain free. 15 tablet 1   dapagliflozin  propanediol (FARXIGA ) 10 MG TABS tablet Take 1 tablet (10 mg total) by mouth daily. 90 tablet 3   glucose blood test strip Use as instructed 100 each 12   hydrALAZINE  (APRESOLINE ) 25 MG tablet Take 1 tablet (25 mg total) by mouth 3 (three) times daily. 180 tablet 3   isosorbide  mononitrate (IMDUR ) 30 MG 24 hr tablet Take 1 tablet (30 mg total) by mouth daily. 90 tablet 3   levothyroxine  (SYNTHROID ) 75 MCG tablet Take 1 tablet (75 mcg total) by mouth in the morning on an empty stomach 100 tablet 0   metFORMIN  (GLUCOPHAGE ) 1000 MG tablet Take 1 tablet (1,000 mg total) by mouth 2 (two) times daily with a meal. 180 tablet 0   mexiletine (MEXITIL ) 150 MG capsule Take 1 capsule (150 mg total) by mouth every 12 (twelve) hours. Needs office visit. 180 capsule 0   pantoprazole  (PROTONIX ) 40 MG tablet Take 1 tablet (40 mg total) by mouth daily. 100 tablet 0   rosuvastatin  (CRESTOR ) 40 MG tablet Take 1 tablet (40 mg total) by mouth daily. 30 tablet 6   sacubitril -valsartan  (ENTRESTO ) 97-103 MG Take 1 tablet by mouth 2 (two) times daily. 60 tablet 11   spironolactone  (ALDACTONE ) 25 MG tablet Take 1 tablet (25 mg total) by mouth daily. 90 tablet 3   No current facility-administered medications for this encounter.   No Known Allergies  Social History   Socioeconomic History   Marital status: Single    Spouse name: Not on file   Number of children: Not on file   Years of education: Not on file   Highest education level: Not on file  Occupational History   Not on file  Tobacco Use   Smoking status: Never    Smokeless tobacco: Never  Substance and Sexual Activity   Alcohol use: No   Drug use: No   Sexual activity: Yes  Other Topics Concern   Not on file  Social History Narrative   Not on file   Social Drivers of Health   Financial Resource Strain: Not on file  Food Insecurity: Not on file  Transportation Needs: Not on file  Physical Activity: Not on file  Stress: Not on file  Social Connections: Not on file  Intimate Partner Violence: Not on file   Family History  Problem Relation Age of Onset   Heart attack Father    Diabetes Mellitus II Neg Hx    CAD Neg Hx    BP (!) 144/80   Pulse 60   Ht 6' (1.829 m)   Wt 94 kg (207 lb 3.2 oz)   SpO2 96%   BMI 28.10 kg/m   Wt Readings from Last 3 Encounters:  03/23/24 94 kg (207 lb 3.2 oz)  12/23/23 93.2 kg (205 lb 6.4 oz)  06/04/23 93 kg (205 lb)   PHYSICAL EXAM: General:  NAD. No resp difficulty, walked into clinic HEENT: Normal Neck: Supple. No JVD. Cor: Regular rate & rhythm. No rubs, gallops or murmurs. Lungs: Clear Abdomen: Soft, nontender, nondistended.  Extremities: No cyanosis, clubbing, rash, edema Neuro: Alert & oriented x 3, moves all 4 extremities w/o difficulty. Affect pleasant.  ASSESSMENT & PLAN: 1. Chronic systolic CHF: Echo in 5/23 with EF 15-20% range with mildly dilated LV, moderately dilated RV with severely decreased systolic function, moderate-severe functional mitral regurgitation, dilated IVC.  Does not have history of ETOH or drugs.  No strong FH of cardiomyopathy.  He was noted in 5/23 hospitalization to have frequent PVCs, may contribute to CMP.  RHC in 5/23 showed elevated filling pressures and low cardiac output (CI 1.89). LHC showed moderate LAD disease, however cardiomyopathy was out of proportion to degree of CAD. 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 Echo 8/23 showed some improvement, EF up to 30-35%, despite being out of his meds x 2 weeks prior.  Cardiac MRI in 11/23 showed further  improvement in LV EF to 49% with moderate LVH, RV EF 47%, severe MR by regurgitant fraction (41%) but not visually, noncoronary LGE noted in the mid-wall basal septum, mid-wall basal-mid inferior wall, mid-wall basal anterolateral wall.  MRI concerning for possible prior myocarditis or cardiac sarcoidosis, but pattern could also be seen with a variant of hypertrophic cardiomyopathy given the moderate LVH. [facility] gene testing was negative for gene mutations associated with familial dilated cardiomyopathies and hypertrophic cardiomyopathy. TEE in 4/24 with EF 45-50% with mild MR.  Cardiac PET in 2/25 showed EF 40%, no active inflammation to suggest active cardiac sarcoidosis.  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  Echo 5/25 showed EF 35-40%, moderate MR. NYHA class I, not volume overloaded on exam.   - Increase hydralazine  to 37.5 mg tid, continue Imdur  30 mg daily. - Continue Entresto  97/103 mg bid. BMET/BNP today.  - He does not need Lasix .  - Continue Coreg  25 mg bid.   - Continue dapagliflozin  10 mg daily.   - Continue spironolactone  25 mg daily.  - Narrow QRS, would not be CRT candidate. EF was out of ICD range on last echo.    2. CAD: Mod LAD and Diag disease on 5/23 cath. The proximal LAD lesion is not critical but looks to be around 70% (moderate stenosis).  This could potentially be intervened upon, as could the 1st diagonal (would not intervene on the severe far distal LAD stenosis).  However, patient reports no chest pain and his cardiomyopathy is diffuse and not explained by the moderate LAD disease. Would manage medically for now unless he develops chest pain.  - Continue ASA  - Continue Crestor  40 mg daily. Check LFTs/lipids today. 3. CKD stage 3: suspect baseline diabetic nephropathy.  - Continue Farxiga . BMET today. 4. Diabetes: Type 2. Per PCP.  5. HTN: BP elevated. - Increase hydralazine  as above 6. Mitral regurgitation: Mod-severe functional MR on 5/23 echo, echo  8/23 showed moderate MR.  Cardiac MRI in 11/23 did not appear to show significant MR visually but calculated regurgitant fraction was 41%. He does not have a loud murmur.  TEE in 4/24 showed only mild MR. Echo 5/25 showed moderate MR. 7. PVCs: Frequent during 5/23 admission. Zio monitor in 11/23 (on mexiletine) showed 7.7% PVCs.  Asymptomatic. - Continue mexiletine 150 mg bid.  -  Continue Coreg .  8. Fatigue: Likely multifactorial. - Check CBC, iron studies and TSH.   Follow up in 4 months with Dr. Rolan.  Harlene HERO [facility] FNP-BC 03/23/2024

## 2024-03-23 ENCOUNTER — Other Ambulatory Visit (HOSPITAL_COMMUNITY): Payer: Self-pay

## 2024-03-23 ENCOUNTER — Ambulatory Visit (HOSPITAL_COMMUNITY)
Admission: RE | Admit: 2024-03-23 | Discharge: 2024-03-23 | Disposition: A | Discharge status: Home / Self Care | Source: Ambulatory Visit | Attending: Family Medicine | Admitting: Family Medicine

## 2024-03-23 ENCOUNTER — Ambulatory Visit (HOSPITAL_COMMUNITY): Payer: Self-pay | Admitting: Family Medicine

## 2024-03-23 ENCOUNTER — Encounter (HOSPITAL_COMMUNITY): Payer: Self-pay

## 2024-03-23 ENCOUNTER — Other Ambulatory Visit (HOSPITAL_COMMUNITY): Payer: Self-pay | Admitting: Cardiology

## 2024-03-23 ENCOUNTER — Other Ambulatory Visit: Payer: Self-pay

## 2024-03-23 VITALS — BP 144/80 | HR 60 | Ht 72.0 in | Wt 207.2 lb

## 2024-03-23 DIAGNOSIS — Z7984 Long term (current) use of oral hypoglycemic drugs: Secondary | ICD-10-CM | POA: Insufficient documentation

## 2024-03-23 DIAGNOSIS — I130 Hypertensive heart and chronic kidney disease with heart failure and stage 1 through stage 4 chronic kidney disease, or unspecified chronic kidney disease: Secondary | ICD-10-CM | POA: Insufficient documentation

## 2024-03-23 DIAGNOSIS — Z79899 Other long term (current) drug therapy: Secondary | ICD-10-CM | POA: Insufficient documentation

## 2024-03-23 DIAGNOSIS — I340 Nonrheumatic mitral (valve) insufficiency: Secondary | ICD-10-CM | POA: Insufficient documentation

## 2024-03-23 DIAGNOSIS — H5462 Unqualified visual loss, left eye, normal vision right eye: Secondary | ICD-10-CM | POA: Diagnosis not present

## 2024-03-23 DIAGNOSIS — M109 Gout, unspecified: Secondary | ICD-10-CM | POA: Insufficient documentation

## 2024-03-23 DIAGNOSIS — I5022 Chronic systolic (congestive) heart failure: Secondary | ICD-10-CM | POA: Diagnosis not present

## 2024-03-23 DIAGNOSIS — I10 Essential (primary) hypertension: Secondary | ICD-10-CM | POA: Diagnosis not present

## 2024-03-23 DIAGNOSIS — E1122 Type 2 diabetes mellitus with diabetic chronic kidney disease: Secondary | ICD-10-CM | POA: Diagnosis not present

## 2024-03-23 DIAGNOSIS — R5383 Other fatigue: Secondary | ICD-10-CM | POA: Diagnosis not present

## 2024-03-23 DIAGNOSIS — I2510 Atherosclerotic heart disease of native coronary artery without angina pectoris: Secondary | ICD-10-CM | POA: Diagnosis not present

## 2024-03-23 DIAGNOSIS — N183 Chronic kidney disease, stage 3 unspecified: Secondary | ICD-10-CM | POA: Diagnosis not present

## 2024-03-23 DIAGNOSIS — I493 Ventricular premature depolarization: Secondary | ICD-10-CM | POA: Diagnosis not present

## 2024-03-23 LAB — CBC
HCT: 38.9 % — ABNORMAL LOW (ref 39.0–52.0)
Hemoglobin: 13.7 g/dL (ref 13.0–17.0)
MCH: 31.7 pg (ref 26.0–34.0)
MCHC: 35.2 g/dL (ref 30.0–36.0)
MCV: 90 fL (ref 80.0–100.0)
Platelets: 143 K/uL — ABNORMAL LOW (ref 150–400)
RBC: 4.32 MIL/uL (ref 4.22–5.81)
RDW: 13.8 % (ref 11.5–15.5)
WBC: 4.3 K/uL (ref 4.0–10.5)
nRBC: 0 % (ref 0.0–0.2)

## 2024-03-23 LAB — COMPREHENSIVE METABOLIC PANEL WITH GFR
ALT: 20 U/L (ref 0–44)
AST: 25 U/L (ref 15–41)
Albumin: 3.5 g/dL (ref 3.5–5.0)
Alkaline Phosphatase: 37 U/L — ABNORMAL LOW (ref 38–126)
Anion gap: 9 (ref 5–15)
BUN: 18 mg/dL (ref 8–23)
CO2: 19 mmol/L — ABNORMAL LOW (ref 22–32)
Calcium: 9.3 mg/dL (ref 8.9–10.3)
Chloride: 105 mmol/L (ref 98–111)
Creatinine, Ser: 1.41 mg/dL — ABNORMAL HIGH (ref 0.61–1.24)
GFR, Estimated: 56 mL/min — ABNORMAL LOW (ref >60)
Glucose, Bld: 246 mg/dL — ABNORMAL HIGH (ref 70–99)
Potassium: 4.3 mmol/L (ref 3.5–5.1)
Sodium: 133 mmol/L — ABNORMAL LOW (ref 135–145)
Total Bilirubin: 0.9 mg/dL (ref 0.0–1.2)
Total Protein: 7.5 g/dL (ref 6.5–8.1)

## 2024-03-23 LAB — LIPID PANEL
Cholesterol: 157 mg/dL (ref 0–200)
HDL: 31 mg/dL — ABNORMAL LOW (ref >40)
LDL Cholesterol: 69 mg/dL (ref 0–99)
Total CHOL/HDL Ratio: 5.1 ratio
Triglycerides: 286 mg/dL — ABNORMAL HIGH (ref <150)
VLDL: 57 mg/dL — ABNORMAL HIGH (ref 0–40)

## 2024-03-23 LAB — FERRITIN: Ferritin: 83 ng/mL (ref 24–336)

## 2024-03-23 LAB — IRON AND TIBC
Iron: 58 ug/dL (ref 45–182)
Saturation Ratios: 19 % (ref 17.9–39.5)
TIBC: 301 ug/dL (ref 250–450)
UIBC: 243 ug/dL

## 2024-03-23 LAB — BRAIN NATRIURETIC PEPTIDE: B Natriuretic Peptide: 59.3 pg/mL (ref 0.0–100.0)

## 2024-03-23 LAB — TSH: TSH: 2.964 u[IU]/mL (ref 0.350–4.500)

## 2024-03-23 MED ORDER — HYDRALAZINE HCL 25 MG PO TABS
37.5000 mg | ORAL_TABLET | Freq: Three times a day (TID) | ORAL | 3 refills | Status: AC
  Filled 2024-03-23 – 2024-04-12 (×2): qty 180, 40d supply, fill #0
  Filled 2024-08-16: qty 180, 40d supply, fill #1
  Filled 2024-09-29: qty 180, 40d supply, fill #2

## 2024-03-23 MED ORDER — DAPAGLIFLOZIN PROPANEDIOL 10 MG PO TABS
10.0000 mg | ORAL_TABLET | Freq: Every day | ORAL | 3 refills | Status: AC
  Filled 2024-03-23: qty 90, 90d supply, fill #0
  Filled 2024-07-01: qty 90, 90d supply, fill #1

## 2024-03-23 MED ORDER — MEXILETINE HCL 150 MG PO CAPS
150.0000 mg | ORAL_CAPSULE | Freq: Two times a day (BID) | ORAL | 0 refills | Status: AC
  Filled 2024-03-23 – 2024-08-16 (×3): qty 180, 90d supply, fill #0

## 2024-03-23 MED ORDER — SPIRONOLACTONE 25 MG PO TABS
25.0000 mg | ORAL_TABLET | Freq: Every day | ORAL | 3 refills | Status: AC
  Filled 2024-03-23: qty 90, 90d supply, fill #0
  Filled 2024-07-01: qty 90, 90d supply, fill #1

## 2024-03-23 NOTE — Patient Instructions (Addendum)
 Good to see you today!  INCREASE hydralazine  to 37.5 mg ( 1 1/2 tablets) Three times a day  Labs done today, your results will be available in MyChart, we will contact you for abnormal readings.  Your physician recommends that you schedule a follow-up appointment 4 months(November) Call office in August to schedule an appointment   If you have any questions or concerns before your next appointment please send us  a message through [place] or call our office at [phone].    TO LEAVE A MESSAGE FOR THE NURSE SELECT OPTION 2, PLEASE LEAVE A MESSAGE INCLUDING: YOUR NAME DATE OF BIRTH CALL BACK NUMBER REASON FOR CALL**this is important as we prioritize the call backs  YOU WILL RECEIVE A CALL BACK THE SAME DAY AS LONG AS YOU CALL BEFORE 4:00 PM At the [facility], you and your health needs are our priority. As part of our continuing mission to provide you with exceptional heart care, we have created designated Provider Care Teams. These Care Teams include your primary Cardiologist (physician) and Advanced Practice Providers (APPs- Physician Assistants and Nurse Practitioners) who all work together to provide you with the care you need, when you need it.   You may see any of the following providers on your designated Care Team at your next follow up: Dr Toribio Fuel Dr Ezra Shuck Dr. Ria Commander Dr. Morene Brownie Amy Lenetta, NP Caffie Shed, [place] [facility] [place], [place] Beckey Coe, NP [place] Lee, NP Ellouise Class, NP Tinnie Redman, PharmD Jaun Bash, PharmD   Please be sure to bring in all your medications bottles to every appointment.    Thank you for choosing [facility]

## 2024-03-23 NOTE — Research (Signed)
 Phone brought back

## 2024-03-25 NOTE — Telephone Encounter (Addendum)
 Patient made aware of increased blood sugar and lab work. Spoke about diet.   ----- Message from Harlene CHRISTELLA Gainer sent at 03/23/2024  2:31 PM EDT ----- Blood sugar very elevated, good LDL but TGs elevated. This may be related to elevated glucose. Iron studies and TSH stable.   ----- Message ----- From: Interface, Lab In [place] Sent: 03/23/2024   9:46 AM EDT To: Harlene CHRISTELLA Gainer, FNP

## 2024-03-26 ENCOUNTER — Other Ambulatory Visit (HOSPITAL_COMMUNITY): Payer: Self-pay

## 2024-04-08 ENCOUNTER — Other Ambulatory Visit (HOSPITAL_COMMUNITY): Payer: Self-pay

## 2024-04-08 ENCOUNTER — Other Ambulatory Visit: Payer: Self-pay

## 2024-04-08 DIAGNOSIS — E782 Mixed hyperlipidemia: Secondary | ICD-10-CM | POA: Diagnosis not present

## 2024-04-08 DIAGNOSIS — I5021 Acute systolic (congestive) heart failure: Secondary | ICD-10-CM | POA: Diagnosis not present

## 2024-04-08 DIAGNOSIS — M109 Gout, unspecified: Secondary | ICD-10-CM | POA: Diagnosis not present

## 2024-04-08 DIAGNOSIS — E038 Other specified hypothyroidism: Secondary | ICD-10-CM | POA: Diagnosis not present

## 2024-04-08 DIAGNOSIS — K219 Gastro-esophageal reflux disease without esophagitis: Secondary | ICD-10-CM | POA: Diagnosis not present

## 2024-04-08 DIAGNOSIS — Z0000 Encounter for general adult medical examination without abnormal findings: Secondary | ICD-10-CM | POA: Diagnosis not present

## 2024-04-08 DIAGNOSIS — I10 Essential (primary) hypertension: Secondary | ICD-10-CM | POA: Diagnosis not present

## 2024-04-08 DIAGNOSIS — E1165 Type 2 diabetes mellitus with hyperglycemia: Secondary | ICD-10-CM | POA: Diagnosis not present

## 2024-04-08 MED ORDER — MEXILETINE HCL 150 MG PO CAPS
150.0000 mg | ORAL_CAPSULE | Freq: Two times a day (BID) | ORAL | 0 refills | Status: AC
  Filled 2024-04-08: qty 28, 14d supply, fill #0

## 2024-04-08 MED ORDER — LEVOTHYROXINE SODIUM 75 MCG PO TABS
75.0000 ug | ORAL_TABLET | Freq: Every morning | ORAL | 0 refills | Status: AC
  Filled 2024-04-08: qty 90, 90d supply, fill #0

## 2024-04-08 MED ORDER — COLCHICINE 0.6 MG PO TABS
ORAL_TABLET | ORAL | 1 refills | Status: DC
  Filled 2024-04-08: qty 15, 7d supply, fill #0
  Filled 2024-04-12 – 2024-07-01 (×2): qty 15, 7d supply, fill #1

## 2024-04-08 MED ORDER — PANTOPRAZOLE SODIUM 40 MG PO TBEC
40.0000 mg | DELAYED_RELEASE_TABLET | Freq: Every day | ORAL | 0 refills | Status: AC
  Filled 2024-04-08: qty 90, 90d supply, fill #0

## 2024-04-08 MED ORDER — DAPAGLIFLOZIN PROPANEDIOL 10 MG PO TABS: 10.0000 mg | ORAL_TABLET | Freq: Every day | ORAL | 0 refills | Status: AC

## 2024-04-08 MED ORDER — ALLOPURINOL 300 MG PO TABS
300.0000 mg | ORAL_TABLET | Freq: Every day | ORAL | 0 refills | Status: AC
  Filled 2024-04-08: qty 90, 90d supply, fill #0

## 2024-04-09 ENCOUNTER — Other Ambulatory Visit (HOSPITAL_COMMUNITY): Payer: Self-pay

## 2024-04-09 MED ORDER — INSULIN PEN NEEDLE 32G X 4 MM MISC
3 refills | Status: AC
  Filled 2024-04-09: qty 100, 25d supply, fill #0
  Filled 2024-07-01: qty 100, 25d supply, fill #1

## 2024-04-09 MED ORDER — TRESIBA FLEXTOUCH 100 UNIT/ML ~~LOC~~ SOPN
10.0000 [IU] | PEN_INJECTOR | Freq: Every day | SUBCUTANEOUS | 2 refills | Status: AC
  Filled 2024-04-09 – 2024-04-12 (×2): qty 15, 30d supply, fill #0

## 2024-04-09 MED ORDER — NOVOLOG FLEXPEN 100 UNIT/ML ~~LOC~~ SOPN
50.0000 [IU] | PEN_INJECTOR | Freq: Every day | SUBCUTANEOUS | 2 refills | Status: AC
  Filled 2024-04-09: qty 15, 30d supply, fill #0
  Filled 2024-08-16: qty 15, 30d supply, fill #1

## 2024-04-12 ENCOUNTER — Other Ambulatory Visit (HOSPITAL_COMMUNITY): Payer: Self-pay | Admitting: Cardiology

## 2024-04-12 ENCOUNTER — Other Ambulatory Visit (HOSPITAL_COMMUNITY): Payer: Self-pay

## 2024-04-12 MED ORDER — ISOSORBIDE MONONITRATE ER 30 MG PO TB24
30.0000 mg | ORAL_TABLET | Freq: Every day | ORAL | 2 refills | Status: AC
  Filled 2024-04-12: qty 90, 90d supply, fill #0
  Filled 2024-07-08: qty 90, 90d supply, fill #1

## 2024-04-12 MED ORDER — CARVEDILOL 25 MG PO TABS
25.0000 mg | ORAL_TABLET | Freq: Two times a day (BID) | ORAL | 6 refills | Status: AC
  Filled 2024-04-12: qty 60, 30d supply, fill #0

## 2024-04-15 ENCOUNTER — Other Ambulatory Visit (HOSPITAL_COMMUNITY): Payer: Self-pay

## 2024-05-07 ENCOUNTER — Other Ambulatory Visit (HOSPITAL_COMMUNITY): Payer: Self-pay

## 2024-05-07 ENCOUNTER — Other Ambulatory Visit: Payer: Self-pay

## 2024-05-07 DIAGNOSIS — E038 Other specified hypothyroidism: Secondary | ICD-10-CM | POA: Diagnosis not present

## 2024-05-07 DIAGNOSIS — E782 Mixed hyperlipidemia: Secondary | ICD-10-CM | POA: Diagnosis not present

## 2024-05-07 DIAGNOSIS — I10 Essential (primary) hypertension: Secondary | ICD-10-CM | POA: Diagnosis not present

## 2024-05-07 DIAGNOSIS — K219 Gastro-esophageal reflux disease without esophagitis: Secondary | ICD-10-CM | POA: Diagnosis not present

## 2024-05-07 DIAGNOSIS — I5021 Acute systolic (congestive) heart failure: Secondary | ICD-10-CM | POA: Diagnosis not present

## 2024-05-07 DIAGNOSIS — E1165 Type 2 diabetes mellitus with hyperglycemia: Secondary | ICD-10-CM | POA: Diagnosis not present

## 2024-05-07 DIAGNOSIS — M109 Gout, unspecified: Secondary | ICD-10-CM | POA: Diagnosis not present

## 2024-05-07 MED ORDER — MEXILETINE HCL 150 MG PO CAPS
150.0000 mg | ORAL_CAPSULE | Freq: Two times a day (BID) | ORAL | 0 refills | Status: AC
  Filled 2024-05-07: qty 28, 14d supply, fill #0

## 2024-05-07 MED ORDER — ISOSORBIDE MONONITRATE ER 30 MG PO TB24
30.0000 mg | ORAL_TABLET | Freq: Every morning | ORAL | 0 refills | Status: AC
  Filled 2024-07-01: qty 90, 90d supply, fill #0

## 2024-05-07 MED ORDER — HYDRALAZINE HCL 50 MG PO TABS
50.0000 mg | ORAL_TABLET | Freq: Three times a day (TID) | ORAL | 0 refills | Status: AC
  Filled 2024-05-07: qty 270, 90d supply, fill #0

## 2024-05-07 MED ORDER — PANTOPRAZOLE SODIUM 40 MG PO TBEC: 40.0000 mg | DELAYED_RELEASE_TABLET | Freq: Every day | ORAL | 0 refills | Status: DC

## 2024-05-07 MED ORDER — DAPAGLIFLOZIN PROPANEDIOL 10 MG PO TABS: 10.0000 mg | ORAL_TABLET | Freq: Every day | ORAL | 0 refills | Status: AC

## 2024-05-07 MED ORDER — SACUBITRIL-VALSARTAN 97-103 MG PO TABS
1.0000 | ORAL_TABLET | Freq: Two times a day (BID) | ORAL | 0 refills | Status: AC
  Filled 2024-05-07: qty 180, 90d supply, fill #0

## 2024-05-07 MED ORDER — ALLOPURINOL 300 MG PO TABS: 300.0000 mg | ORAL_TABLET | Freq: Every day | ORAL | 0 refills | Status: DC

## 2024-05-07 MED ORDER — TRESIBA FLEXTOUCH 100 UNIT/ML ~~LOC~~ SOPN
50.0000 [IU] | PEN_INJECTOR | Freq: Every day | SUBCUTANEOUS | 2 refills | Status: AC
  Filled 2024-05-07: qty 15, 30d supply, fill #0
  Filled 2024-07-01: qty 15, 30d supply, fill #1

## 2024-05-07 MED ORDER — ROSUVASTATIN CALCIUM 20 MG PO TABS
20.0000 mg | ORAL_TABLET | Freq: Every day | ORAL | 0 refills | Status: AC
  Filled 2024-05-07: qty 90, 90d supply, fill #0

## 2024-05-07 MED ORDER — LEVOTHYROXINE SODIUM 75 MCG PO TABS: 75.0000 ug | ORAL_TABLET | Freq: Every morning | ORAL | 0 refills | Status: DC

## 2024-05-07 MED ORDER — NOVOLOG FLEXPEN 100 UNIT/ML ~~LOC~~ SOPN
50.0000 [IU] | PEN_INJECTOR | Freq: Every day | SUBCUTANEOUS | 2 refills | Status: AC
  Filled 2024-05-07: qty 15, 30d supply, fill #0
  Filled 2024-07-01: qty 15, 30d supply, fill #1

## 2024-05-07 MED ORDER — CARVEDILOL 12.5 MG PO TABS
12.5000 mg | ORAL_TABLET | Freq: Two times a day (BID) | ORAL | 0 refills | Status: AC
  Filled 2024-05-07: qty 180, 90d supply, fill #0

## 2024-05-07 MED ORDER — COLCHICINE 0.6 MG PO TABS
ORAL_TABLET | ORAL | 0 refills | Status: DC
  Filled 2024-05-07: qty 15, 7d supply, fill #0

## 2024-05-07 MED ORDER — METFORMIN HCL 1000 MG PO TABS
1000.0000 mg | ORAL_TABLET | Freq: Two times a day (BID) | ORAL | 0 refills | Status: DC
  Filled 2024-05-07: qty 180, 90d supply, fill #0

## 2024-07-01 ENCOUNTER — Other Ambulatory Visit (HOSPITAL_COMMUNITY): Payer: Self-pay

## 2024-07-01 ENCOUNTER — Other Ambulatory Visit: Payer: Self-pay

## 2024-07-02 ENCOUNTER — Other Ambulatory Visit: Payer: Self-pay

## 2024-07-02 ENCOUNTER — Other Ambulatory Visit (HOSPITAL_COMMUNITY): Payer: Self-pay

## 2024-07-02 DIAGNOSIS — M109 Gout, unspecified: Secondary | ICD-10-CM | POA: Diagnosis not present

## 2024-07-02 DIAGNOSIS — E1165 Type 2 diabetes mellitus with hyperglycemia: Secondary | ICD-10-CM | POA: Diagnosis not present

## 2024-07-02 DIAGNOSIS — E782 Mixed hyperlipidemia: Secondary | ICD-10-CM | POA: Diagnosis not present

## 2024-07-02 DIAGNOSIS — K219 Gastro-esophageal reflux disease without esophagitis: Secondary | ICD-10-CM | POA: Diagnosis not present

## 2024-07-02 DIAGNOSIS — I5021 Acute systolic (congestive) heart failure: Secondary | ICD-10-CM | POA: Diagnosis not present

## 2024-07-02 DIAGNOSIS — I10 Essential (primary) hypertension: Secondary | ICD-10-CM | POA: Diagnosis not present

## 2024-07-02 DIAGNOSIS — E038 Other specified hypothyroidism: Secondary | ICD-10-CM | POA: Diagnosis not present

## 2024-07-02 DIAGNOSIS — H1032 Unspecified acute conjunctivitis, left eye: Secondary | ICD-10-CM | POA: Diagnosis not present

## 2024-07-02 MED ORDER — CARVEDILOL 12.5 MG PO TABS
12.5000 mg | ORAL_TABLET | Freq: Two times a day (BID) | ORAL | 0 refills | Status: AC
  Filled 2024-07-02 – 2024-08-16 (×2): qty 180, 90d supply, fill #0

## 2024-07-02 MED ORDER — NOVOLOG FLEXPEN 100 UNIT/ML ~~LOC~~ SOPN
50.0000 [IU] | PEN_INJECTOR | Freq: Every day | SUBCUTANEOUS | 2 refills | Status: AC
  Filled 2024-07-02: qty 15, 30d supply, fill #0

## 2024-07-02 MED ORDER — COLCHICINE 0.6 MG PO TABS
0.6000 mg | ORAL_TABLET | Freq: Three times a day (TID) | ORAL | 1 refills | Status: DC
  Filled 2024-07-02: qty 15, 7d supply, fill #0
  Filled 2024-07-08: qty 15, 5d supply, fill #0

## 2024-07-02 MED ORDER — LEVOTHYROXINE SODIUM 75 MCG PO TABS
75.0000 ug | ORAL_TABLET | Freq: Every morning | ORAL | 0 refills | Status: DC
  Filled 2024-07-02 (×2): qty 90, 90d supply, fill #0
  Filled 2024-09-29: qty 10, 10d supply, fill #1

## 2024-07-02 MED ORDER — INSULIN PEN NEEDLE 32G X 4 MM MISC
1.0000 | 3 refills | Status: AC | PRN
  Filled 2024-07-02: qty 100, 25d supply, fill #0

## 2024-07-02 MED ORDER — METFORMIN HCL 1000 MG PO TABS
1000.0000 mg | ORAL_TABLET | Freq: Two times a day (BID) | ORAL | 0 refills | Status: AC
  Filled 2024-07-02 – 2024-08-16 (×2): qty 180, 90d supply, fill #0

## 2024-07-02 MED ORDER — HYDRALAZINE HCL 50 MG PO TABS
50.0000 mg | ORAL_TABLET | Freq: Three times a day (TID) | ORAL | 0 refills | Status: AC
  Filled 2024-07-02: qty 270, 90d supply, fill #0
  Filled 2024-08-16: qty 300, 100d supply, fill #0

## 2024-07-02 MED ORDER — ISOSORBIDE MONONITRATE ER 30 MG PO TB24
30.0000 mg | ORAL_TABLET | Freq: Every morning | ORAL | 0 refills | Status: AC
  Filled 2024-07-02: qty 90, 90d supply, fill #0

## 2024-07-02 MED ORDER — ALLOPURINOL 300 MG PO TABS
300.0000 mg | ORAL_TABLET | Freq: Every day | ORAL | 0 refills | Status: AC
Start: 2024-07-02 — End: ?
  Filled 2024-07-02 (×2): qty 90, 90d supply, fill #0

## 2024-07-02 MED ORDER — SACUBITRIL-VALSARTAN 97-103 MG PO TABS
1.0000 | ORAL_TABLET | Freq: Two times a day (BID) | ORAL | 0 refills | Status: AC
  Filled 2024-07-02 – 2024-08-16 (×2): qty 180, 90d supply, fill #0

## 2024-07-02 MED ORDER — PANTOPRAZOLE SODIUM 40 MG PO TBEC
40.0000 mg | DELAYED_RELEASE_TABLET | Freq: Every day | ORAL | 0 refills | Status: AC
  Filled 2024-07-02: qty 90, 90d supply, fill #0

## 2024-07-02 MED ORDER — TRESIBA FLEXTOUCH 100 UNIT/ML ~~LOC~~ SOPN
50.0000 [IU] | PEN_INJECTOR | Freq: Every day | SUBCUTANEOUS | 2 refills | Status: AC
  Filled 2024-07-02 – 2024-08-16 (×2): qty 15, 30d supply, fill #0

## 2024-07-02 MED ORDER — SPIRONOLACTONE 25 MG PO TABS
25.0000 mg | ORAL_TABLET | Freq: Every day | ORAL | 0 refills | Status: AC
  Filled 2024-07-02: qty 90, 90d supply, fill #0

## 2024-07-02 MED ORDER — DAPAGLIFLOZIN PROPANEDIOL 10 MG PO TABS
10.0000 mg | ORAL_TABLET | Freq: Every day | ORAL | 0 refills | Status: AC
Start: 2024-07-02 — End: ?
  Filled 2024-07-02: qty 90, 90d supply, fill #0

## 2024-07-02 MED ORDER — FREESTYLE LIBRE 3 SENSOR MISC
1.0000 | 11 refills | Status: AC
  Filled 2024-07-02 (×2): qty 2, 28d supply, fill #0
  Filled 2024-07-30: qty 2, 28d supply, fill #1
  Filled 2024-08-30: qty 2, 28d supply, fill #2
  Filled 2024-09-29: qty 2, 28d supply, fill #3

## 2024-07-02 MED ORDER — NEOMYCIN-POLYMYXIN-DEXAMETH 0.1 % OP SUSP
1.0000 [drp] | Freq: Four times a day (QID) | OPHTHALMIC | 0 refills | Status: DC
  Filled 2024-07-02 (×2): qty 5, 10d supply, fill #0

## 2024-07-02 MED ORDER — ROSUVASTATIN CALCIUM 20 MG PO TABS
20.0000 mg | ORAL_TABLET | Freq: Every day | ORAL | 0 refills | Status: AC
  Filled 2024-07-02 – 2024-08-16 (×2): qty 90, 90d supply, fill #0

## 2024-07-02 MED ORDER — MEXILETINE HCL 150 MG PO CAPS
150.0000 mg | ORAL_CAPSULE | Freq: Two times a day (BID) | ORAL | 0 refills | Status: AC
  Filled 2024-07-02: qty 28, 14d supply, fill #0

## 2024-07-06 ENCOUNTER — Other Ambulatory Visit (HOSPITAL_COMMUNITY): Payer: Self-pay

## 2024-07-07 ENCOUNTER — Other Ambulatory Visit (HOSPITAL_COMMUNITY): Payer: Self-pay

## 2024-07-08 ENCOUNTER — Other Ambulatory Visit (HOSPITAL_COMMUNITY): Payer: Self-pay

## 2024-07-08 MED ORDER — NEOMYCIN-POLYMYXIN-DEXAMETH 3.5-10000-0.1 OP SUSP
1.0000 [drp] | Freq: Four times a day (QID) | OPHTHALMIC | 0 refills | Status: AC
  Filled 2024-07-08: qty 5, 13d supply, fill #0
  Filled 2024-07-08: qty 5, 25d supply, fill #0

## 2024-07-14 ENCOUNTER — Other Ambulatory Visit (HOSPITAL_COMMUNITY): Payer: Self-pay

## 2024-07-16 ENCOUNTER — Other Ambulatory Visit (HOSPITAL_COMMUNITY): Payer: Self-pay

## 2024-07-30 ENCOUNTER — Other Ambulatory Visit (HOSPITAL_COMMUNITY): Payer: Self-pay

## 2024-08-11 ENCOUNTER — Other Ambulatory Visit (HOSPITAL_COMMUNITY): Payer: Self-pay

## 2024-08-11 MED ORDER — DORZOLAMIDE HCL-TIMOLOL MAL 2-0.5 % OP SOLN
1.0000 [drp] | Freq: Two times a day (BID) | OPHTHALMIC | 1 refills | Status: AC
  Filled 2024-08-11: qty 10, 90d supply, fill #0

## 2024-08-16 ENCOUNTER — Other Ambulatory Visit (HOSPITAL_COMMUNITY): Payer: Self-pay

## 2024-08-25 ENCOUNTER — Other Ambulatory Visit (HOSPITAL_COMMUNITY): Payer: Self-pay

## 2024-08-30 ENCOUNTER — Other Ambulatory Visit (HOSPITAL_COMMUNITY): Payer: Self-pay

## 2024-09-29 ENCOUNTER — Other Ambulatory Visit (HOSPITAL_COMMUNITY): Payer: Self-pay

## 2024-09-29 MED ORDER — LEVOTHYROXINE SODIUM 75 MCG PO TABS
75.0000 ug | ORAL_TABLET | Freq: Every morning | ORAL | 0 refills | Status: AC
  Filled 2024-09-29: qty 90, 90d supply, fill #0
